# Patient Record
Sex: Female | Born: 2000 | Race: White | Hispanic: No | Marital: Single | Smoking: Current every day smoker
Health system: Southern US, Community
[De-identification: ages and names within clinical notes are randomized; demographics above are authoritative.]

## PROBLEM LIST (undated history)

## (undated) ENCOUNTER — Ambulatory Visit: Payer: BC Managed Care – PPO | Source: Home / Self Care

## (undated) DIAGNOSIS — F909 Attention-deficit hyperactivity disorder, unspecified type: Secondary | ICD-10-CM

## (undated) DIAGNOSIS — G35 Multiple sclerosis: Secondary | ICD-10-CM

## (undated) HISTORY — PX: WISDOM TOOTH EXTRACTION: SHX21

## (undated) HISTORY — DX: Multiple sclerosis: G35

---
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
## 2019-03-06 DIAGNOSIS — R202 Paresthesia of skin: Secondary | ICD-10-CM | POA: Diagnosis not present

## 2019-03-06 DIAGNOSIS — F900 Attention-deficit hyperactivity disorder, predominantly inattentive type: Secondary | ICD-10-CM | POA: Diagnosis not present

## 2019-03-06 DIAGNOSIS — T148XXA Other injury of unspecified body region, initial encounter: Secondary | ICD-10-CM | POA: Diagnosis not present

## 2019-03-06 DIAGNOSIS — M25551 Pain in right hip: Secondary | ICD-10-CM | POA: Diagnosis not present

## 2019-03-06 DIAGNOSIS — F419 Anxiety disorder, unspecified: Secondary | ICD-10-CM | POA: Diagnosis not present

## 2019-03-06 DIAGNOSIS — W19XXXA Unspecified fall, initial encounter: Secondary | ICD-10-CM | POA: Diagnosis not present

## 2019-03-28 DIAGNOSIS — S8992XA Unspecified injury of left lower leg, initial encounter: Secondary | ICD-10-CM | POA: Diagnosis not present

## 2019-03-29 DIAGNOSIS — M25562 Pain in left knee: Secondary | ICD-10-CM | POA: Diagnosis not present

## 2019-03-29 DIAGNOSIS — M5137 Other intervertebral disc degeneration, lumbosacral region: Secondary | ICD-10-CM | POA: Diagnosis not present

## 2019-03-29 DIAGNOSIS — M9903 Segmental and somatic dysfunction of lumbar region: Secondary | ICD-10-CM | POA: Diagnosis not present

## 2019-03-29 DIAGNOSIS — M545 Low back pain: Secondary | ICD-10-CM | POA: Diagnosis not present

## 2019-04-04 DIAGNOSIS — M5137 Other intervertebral disc degeneration, lumbosacral region: Secondary | ICD-10-CM | POA: Diagnosis not present

## 2019-04-04 DIAGNOSIS — M545 Low back pain: Secondary | ICD-10-CM | POA: Diagnosis not present

## 2019-04-04 DIAGNOSIS — M9903 Segmental and somatic dysfunction of lumbar region: Secondary | ICD-10-CM | POA: Diagnosis not present

## 2019-04-06 DIAGNOSIS — M9903 Segmental and somatic dysfunction of lumbar region: Secondary | ICD-10-CM | POA: Diagnosis not present

## 2019-04-06 DIAGNOSIS — M545 Low back pain: Secondary | ICD-10-CM | POA: Diagnosis not present

## 2019-04-06 DIAGNOSIS — M5137 Other intervertebral disc degeneration, lumbosacral region: Secondary | ICD-10-CM | POA: Diagnosis not present

## 2019-04-07 DIAGNOSIS — R202 Paresthesia of skin: Secondary | ICD-10-CM | POA: Diagnosis not present

## 2019-04-07 DIAGNOSIS — H539 Unspecified visual disturbance: Secondary | ICD-10-CM | POA: Diagnosis not present

## 2019-04-07 DIAGNOSIS — R479 Unspecified speech disturbances: Secondary | ICD-10-CM | POA: Diagnosis not present

## 2019-04-07 DIAGNOSIS — R29818 Other symptoms and signs involving the nervous system: Secondary | ICD-10-CM | POA: Diagnosis not present

## 2019-04-09 ENCOUNTER — Emergency Department (HOSPITAL_COMMUNITY): Payer: BC Managed Care – PPO

## 2019-04-09 ENCOUNTER — Other Ambulatory Visit: Payer: Self-pay

## 2019-04-09 ENCOUNTER — Encounter (HOSPITAL_COMMUNITY): Payer: Self-pay | Admitting: Emergency Medicine

## 2019-04-09 ENCOUNTER — Inpatient Hospital Stay (HOSPITAL_COMMUNITY)
Admission: EM | Admit: 2019-04-09 | Discharge: 2019-04-17 | DRG: 059 | Disposition: A | Discharge status: Rehabilitation Facility | Payer: BC Managed Care – PPO | Attending: Family Medicine | Admitting: Family Medicine

## 2019-04-09 DIAGNOSIS — Z79899 Other long term (current) drug therapy: Secondary | ICD-10-CM | POA: Diagnosis not present

## 2019-04-09 DIAGNOSIS — E669 Obesity, unspecified: Secondary | ICD-10-CM | POA: Diagnosis present

## 2019-04-09 DIAGNOSIS — N319 Neuromuscular dysfunction of bladder, unspecified: Secondary | ICD-10-CM | POA: Diagnosis not present

## 2019-04-09 DIAGNOSIS — R4781 Slurred speech: Secondary | ICD-10-CM | POA: Diagnosis not present

## 2019-04-09 DIAGNOSIS — G379 Demyelinating disease of central nervous system, unspecified: Secondary | ICD-10-CM

## 2019-04-09 DIAGNOSIS — Z6832 Body mass index (BMI) 32.0-32.9, adult: Secondary | ICD-10-CM

## 2019-04-09 DIAGNOSIS — F17210 Nicotine dependence, cigarettes, uncomplicated: Secondary | ICD-10-CM | POA: Diagnosis present

## 2019-04-09 DIAGNOSIS — R200 Anesthesia of skin: Secondary | ICD-10-CM | POA: Diagnosis present

## 2019-04-09 DIAGNOSIS — N3941 Urge incontinence: Secondary | ICD-10-CM | POA: Diagnosis not present

## 2019-04-09 DIAGNOSIS — R29898 Other symptoms and signs involving the musculoskeletal system: Secondary | ICD-10-CM | POA: Diagnosis not present

## 2019-04-09 DIAGNOSIS — K592 Neurogenic bowel, not elsewhere classified: Secondary | ICD-10-CM | POA: Diagnosis not present

## 2019-04-09 DIAGNOSIS — R29818 Other symptoms and signs involving the nervous system: Secondary | ICD-10-CM | POA: Diagnosis not present

## 2019-04-09 DIAGNOSIS — Z720 Tobacco use: Secondary | ICD-10-CM | POA: Diagnosis present

## 2019-04-09 DIAGNOSIS — Z6831 Body mass index (BMI) 31.0-31.9, adult: Secondary | ICD-10-CM | POA: Diagnosis not present

## 2019-04-09 DIAGNOSIS — Z8349 Family history of other endocrine, nutritional and metabolic diseases: Secondary | ICD-10-CM | POA: Diagnosis not present

## 2019-04-09 DIAGNOSIS — H532 Diplopia: Secondary | ICD-10-CM | POA: Diagnosis not present

## 2019-04-09 DIAGNOSIS — G35 Multiple sclerosis: Secondary | ICD-10-CM | POA: Diagnosis present

## 2019-04-09 DIAGNOSIS — Z881 Allergy status to other antibiotic agents status: Secondary | ICD-10-CM | POA: Diagnosis not present

## 2019-04-09 DIAGNOSIS — F17200 Nicotine dependence, unspecified, uncomplicated: Secondary | ICD-10-CM | POA: Diagnosis not present

## 2019-04-09 DIAGNOSIS — Z23 Encounter for immunization: Secondary | ICD-10-CM

## 2019-04-09 DIAGNOSIS — Z885 Allergy status to narcotic agent status: Secondary | ICD-10-CM

## 2019-04-09 DIAGNOSIS — E875 Hyperkalemia: Secondary | ICD-10-CM | POA: Diagnosis not present

## 2019-04-09 DIAGNOSIS — W19XXXA Unspecified fall, initial encounter: Secondary | ICD-10-CM | POA: Diagnosis present

## 2019-04-09 DIAGNOSIS — Z713 Dietary counseling and surveillance: Secondary | ICD-10-CM | POA: Diagnosis not present

## 2019-04-09 DIAGNOSIS — Z888 Allergy status to other drugs, medicaments and biological substances status: Secondary | ICD-10-CM

## 2019-04-09 DIAGNOSIS — R531 Weakness: Secondary | ICD-10-CM | POA: Diagnosis not present

## 2019-04-09 DIAGNOSIS — Z8249 Family history of ischemic heart disease and other diseases of the circulatory system: Secondary | ICD-10-CM

## 2019-04-09 DIAGNOSIS — F988 Other specified behavioral and emotional disorders with onset usually occurring in childhood and adolescence: Secondary | ICD-10-CM | POA: Diagnosis present

## 2019-04-09 DIAGNOSIS — G360 Neuromyelitis optica [Devic]: Secondary | ICD-10-CM | POA: Diagnosis present

## 2019-04-09 DIAGNOSIS — Z716 Tobacco abuse counseling: Secondary | ICD-10-CM | POA: Diagnosis not present

## 2019-04-09 DIAGNOSIS — R270 Ataxia, unspecified: Secondary | ICD-10-CM | POA: Diagnosis present

## 2019-04-09 DIAGNOSIS — F909 Attention-deficit hyperactivity disorder, unspecified type: Secondary | ICD-10-CM

## 2019-04-09 DIAGNOSIS — Z833 Family history of diabetes mellitus: Secondary | ICD-10-CM | POA: Diagnosis not present

## 2019-04-09 DIAGNOSIS — S8012XD Contusion of left lower leg, subsequent encounter: Secondary | ICD-10-CM | POA: Diagnosis not present

## 2019-04-09 DIAGNOSIS — S299XXA Unspecified injury of thorax, initial encounter: Secondary | ICD-10-CM | POA: Diagnosis not present

## 2019-04-09 DIAGNOSIS — S8012XA Contusion of left lower leg, initial encounter: Secondary | ICD-10-CM | POA: Diagnosis not present

## 2019-04-09 DIAGNOSIS — G8190 Hemiplegia, unspecified affecting unspecified side: Secondary | ICD-10-CM | POA: Diagnosis not present

## 2019-04-09 DIAGNOSIS — E876 Hypokalemia: Secondary | ICD-10-CM | POA: Diagnosis not present

## 2019-04-09 DIAGNOSIS — K5900 Constipation, unspecified: Secondary | ICD-10-CM | POA: Diagnosis not present

## 2019-04-09 DIAGNOSIS — R000 Tachycardia, unspecified: Secondary | ICD-10-CM | POA: Diagnosis not present

## 2019-04-09 DIAGNOSIS — E6609 Other obesity due to excess calories: Secondary | ICD-10-CM | POA: Diagnosis not present

## 2019-04-09 DIAGNOSIS — Z20822 Contact with and (suspected) exposure to covid-19: Secondary | ICD-10-CM | POA: Diagnosis not present

## 2019-04-09 HISTORY — DX: Attention-deficit hyperactivity disorder, unspecified type: F90.9

## 2019-04-09 LAB — URINALYSIS, ROUTINE W REFLEX MICROSCOPIC
Bilirubin Urine: NEGATIVE
Glucose, UA: NEGATIVE mg/dL
Hgb urine dipstick: NEGATIVE
Ketones, ur: 5 mg/dL — AB
Nitrite: NEGATIVE
Protein, ur: NEGATIVE mg/dL
Specific Gravity, Urine: 1.013 (ref 1.005–1.030)
pH: 7 (ref 5.0–8.0)

## 2019-04-09 LAB — CBC WITH DIFFERENTIAL/PLATELET
Abs Immature Granulocytes: 0.01 10*3/uL (ref 0.00–0.07)
Basophils Absolute: 0 10*3/uL (ref 0.0–0.1)
Basophils Relative: 0 %
Eosinophils Absolute: 0.2 10*3/uL (ref 0.0–0.5)
Eosinophils Relative: 4 %
HCT: 44 % (ref 36.0–46.0)
Hemoglobin: 15 g/dL (ref 12.0–15.0)
Immature Granulocytes: 0 %
Lymphocytes Relative: 37 %
Lymphs Abs: 1.8 10*3/uL (ref 0.7–4.0)
MCH: 28.9 pg (ref 26.0–34.0)
MCHC: 34.1 g/dL (ref 30.0–36.0)
MCV: 84.8 fL (ref 80.0–100.0)
Monocytes Absolute: 0.5 10*3/uL (ref 0.1–1.0)
Monocytes Relative: 10 %
Neutro Abs: 2.4 10*3/uL (ref 1.7–7.7)
Neutrophils Relative %: 49 %
Platelets: 210 10*3/uL (ref 150–400)
RBC: 5.19 MIL/uL — ABNORMAL HIGH (ref 3.87–5.11)
RDW: 11.9 % (ref 11.5–15.5)
WBC: 4.9 10*3/uL (ref 4.0–10.5)
nRBC: 0 % (ref 0.0–0.2)

## 2019-04-09 LAB — COMPREHENSIVE METABOLIC PANEL
ALT: 19 U/L (ref 0–44)
AST: 31 U/L (ref 15–41)
Albumin: 4 g/dL (ref 3.5–5.0)
Alkaline Phosphatase: 60 U/L (ref 38–126)
Anion gap: 11 (ref 5–15)
BUN: 9 mg/dL (ref 6–20)
CO2: 22 mmol/L (ref 22–32)
Calcium: 9.1 mg/dL (ref 8.9–10.3)
Chloride: 105 mmol/L (ref 98–111)
Creatinine, Ser: 0.77 mg/dL (ref 0.44–1.00)
GFR calc Af Amer: >60 mL/min (ref >60)
GFR calc non Af Amer: >60 mL/min (ref >60)
Glucose, Bld: 81 mg/dL (ref 70–99)
Potassium: 4.7 mmol/L (ref 3.5–5.1)
Sodium: 138 mmol/L (ref 135–145)
Total Bilirubin: 1.1 mg/dL (ref 0.3–1.2)
Total Protein: 6.6 g/dL (ref 6.5–8.1)

## 2019-04-09 LAB — PREGNANCY, URINE: Preg Test, Ur: NEGATIVE

## 2019-04-09 LAB — TSH: TSH: 2.107 u[IU]/mL (ref 0.350–4.500)

## 2019-04-09 MED ORDER — SODIUM CHLORIDE 0.9 % IV SOLN
1000.0000 mg | Freq: Once | INTRAVENOUS | Status: AC
  Administered 2019-04-10: 1000 mg via INTRAVENOUS
  Filled 2019-04-09: qty 8

## 2019-04-09 MED ORDER — GADOBUTROL 1 MMOL/ML IV SOLN
7.5000 mL | Freq: Once | INTRAVENOUS | Status: AC | PRN
  Administered 2019-04-09: 23:00:00 7.5 mL via INTRAVENOUS

## 2019-04-09 NOTE — ED Provider Notes (Signed)
Assumed care from Dr. Adriana Simas at 9 PM.  Patient with nonlocalizing neurologic symptoms with diplopia when looking to the right as well as numbness from the elbow down on the left upper arm and the knee down on the left lower leg.  Patient's labs are reassuring.  Pregnancy is negative.  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  There is no evidence of hemorrhage or spinal canal stenosis.  Will discuss with neurology for further recommendations.  11:36 PM Will admit.  Pt will get steroids.   Gwyneth Sprout, MD 04/09/19 [phone]

## 2019-04-09 NOTE — ED Notes (Signed)
Patient transported to CT 

## 2019-04-09 NOTE — ED Provider Notes (Signed)
[facility] DEPARTMENT Provider Note   CSN: [id number] Arrival date & time: 04/09/19  1442     History Chief Complaint  Patient presents with  . Weakness  . Fall    Jean Carlson is a 19 y.o. female.  Chief complaint weakness and numbness left upper extremity from elbow distally, left lower extremity from knee distally, slurred speech, double vision when looking to the right.  Symptoms have been evolving for several days.  No previous history of same.  No stiff neck, facial drooping, fever, sweats, chills.        Past Medical History:  Diagnosis Date  . ADHD     There are no problems to display for this patient.   History reviewed. No pertinent surgical history.   OB History   No obstetric history on file.     Family History  Problem Relation Age of Onset  . Hyperlipidemia Maternal Grandmother   . Hypertension Maternal Grandmother   . Diabetes Paternal Grandfather   . Hypertension Paternal Grandfather   . Heart failure Paternal Grandfather     Social History   Tobacco Use  . Smoking status: Current Every Day Smoker  . Smokeless tobacco: Never Used  Substance Use Topics  . Alcohol use: Not Currently  . Drug use: Never    Home Medications Prior to Admission medications   Medication Sig Start Date End Date Taking? Authorizing Provider  FLUoxetine (PROZAC) 40 MG capsule Take 40 mg by mouth in the morning. 01/19/19  Yes [provider]  ibuprofen (ADVIL) 200 MG tablet Take 600-800 mg by mouth every 6 (six) hours as needed for headache or mild pain.   Yes [provider]  LO LOESTRIN FE 1 MG-10 MCG / 10 MCG tablet Take 1 tablet by mouth daily.  01/19/19  Yes [provider]  methylphenidate 36 MG PO CR tablet Take 36 mg by mouth in the morning.   Yes [provider]  meloxicam (MOBIC) 7.5 MG tablet Take 1 tablet (7.5 mg total) by mouth daily. Patient not taking: Reported on 04/09/2019 10/28/12    Posey Boyer, MD    Allergies    Amoxicillin, Codeine, and Vyvanse [lisdexamfetamine]  Review of Systems   Review of Systems  All other systems reviewed and are negative.   Physical Exam Updated Vital Signs BP 123/81 (BP Location: Right Arm)   Pulse 90   Temp 98.6 F (37 C) (Oral)   Resp 16   Ht 5\' 4"  (1.626 m)   Wt 84.8 kg   LMP 03/17/2019   SpO2 100%   BMI 32.10 kg/m   Physical Exam Vitals and nursing note reviewed.  Constitutional:      Appearance: She is well-developed.  HENT:     Head: Normocephalic and atraumatic.  Eyes:     Conjunctiva/sclera: Conjunctivae normal.  Cardiovascular:     Rate and Rhythm: Normal rate and regular rhythm.  Pulmonary:     Effort: Pulmonary effort is normal.     Breath sounds: Normal breath sounds.  Abdominal:     General: Bowel sounds are normal.     Palpations: Abdomen is soft.  Musculoskeletal:        General: Normal range of motion.     Cervical back: Neck supple.  Skin:    General: Skin is warm and dry.  Neurological:     Mental Status: She is alert and oriented to person, place, and time.     Comments: Moves  left arm and left leg appropriately.  Questionable decreased grip on the left.  Psychiatric:        Behavior: Behavior normal.     ED Results / Procedures / Treatments   Labs (all labs ordered are listed, but only abnormal results are displayed) Labs Reviewed  CBC WITH DIFFERENTIAL/PLATELET - Abnormal; Notable for the following components:      Result Value   RBC 5.19 (*)    All other components within normal limits  COMPREHENSIVE METABOLIC PANEL  TSH  URINALYSIS, ROUTINE W REFLEX MICROSCOPIC  PREGNANCY, URINE    EKG EKG Interpretation  Date/Time:  Sunday April 09 2019 16:51:21 EDT Ventricular Rate:  94 PR Interval:    QRS Duration: 89 QT Interval:  345 QTC Calculation: 432 R Axis:   76 Text Interpretation: Sinus rhythm Confirmed by Donnetta Hutching ([id number]) on 04/09/2019 4:56:29 PM Also confirmed by  Donnetta Hutching ([id number])  on 04/09/2019 4:57:42 PM   Radiology CT Head Wo Contrast  Result Date: 04/09/2019 CLINICAL DATA:  Left-sided neuro deficits EXAM: CT HEAD WITHOUT CONTRAST TECHNIQUE: Contiguous axial images were obtained from the base of the skull through the vertex without intravenous contrast. COMPARISON:  None. FINDINGS: Brain: No evidence of acute infarction, hemorrhage, hydrocephalus, extra-axial collection or mass lesion/mass effect. Vascular: No hyperdense vessel or unexpected calcification. Skull: Normal. Negative for fracture or focal lesion. Sinuses/Orbits: No acute finding. Other: None. IMPRESSION: No acute intracranial pathology. Electronically Signed   By: Lauralyn Primes M.D.   On: 04/09/2019 17:29    Procedures Procedures (including critical care time)  Medications Ordered in ED Medications - No data to display  ED Course  I have reviewed the triage vital signs and the nursing notes.  Pertinent labs & imaging results that were available during my care of the patient were reviewed by me and considered in my medical decision making (see chart for details).    MDM Rules/Calculators/A&P                      Uncertain etiology of patient's symptom complex.  CT head shows no acute pathology.  Labs reasonable.  Will obtain MRI of brain.  2045:  Disc c Dr Anitra Lauth Final Clinical Impression(s) / ED Diagnoses Final diagnoses:  Weakness of left upper extremity  Weakness of left lower extremity  Ataxia  Diplopia    Rx / DC Orders ED Discharge Orders    None       Donnetta Hutching, MD 04/09/19 2025

## 2019-04-09 NOTE — ED Triage Notes (Addendum)
Pt to triage via [facility].  Slipped and fell in bathtub 2 weeks ago and injured L knee.  For the last week she reports increased weakness to L lower leg and L arm from elbow to hand with unsteady gait, decreased L grip strength, and L arm drift.  Family reports intermittent slurred speech x 2 days. Pt also reports diplopia only when she turns her head to the right.  Denies LOC.  Denies head or neck pain from fall.

## 2019-04-09 NOTE — ED Notes (Signed)
Father, Gerlene Burdock, to be called with updates at [phone].

## 2019-04-10 ENCOUNTER — Emergency Department (HOSPITAL_COMMUNITY): Payer: BC Managed Care – PPO

## 2019-04-10 DIAGNOSIS — W19XXXA Unspecified fall, initial encounter: Secondary | ICD-10-CM | POA: Diagnosis present

## 2019-04-10 DIAGNOSIS — Z720 Tobacco use: Secondary | ICD-10-CM | POA: Diagnosis present

## 2019-04-10 DIAGNOSIS — Z8349 Family history of other endocrine, nutritional and metabolic diseases: Secondary | ICD-10-CM | POA: Diagnosis not present

## 2019-04-10 DIAGNOSIS — K592 Neurogenic bowel, not elsewhere classified: Secondary | ICD-10-CM | POA: Diagnosis not present

## 2019-04-10 DIAGNOSIS — Z833 Family history of diabetes mellitus: Secondary | ICD-10-CM | POA: Diagnosis not present

## 2019-04-10 DIAGNOSIS — F988 Other specified behavioral and emotional disorders with onset usually occurring in childhood and adolescence: Secondary | ICD-10-CM | POA: Diagnosis present

## 2019-04-10 DIAGNOSIS — N319 Neuromuscular dysfunction of bladder, unspecified: Secondary | ICD-10-CM | POA: Diagnosis not present

## 2019-04-10 DIAGNOSIS — R29898 Other symptoms and signs involving the musculoskeletal system: Secondary | ICD-10-CM | POA: Diagnosis not present

## 2019-04-10 DIAGNOSIS — H532 Diplopia: Secondary | ICD-10-CM | POA: Diagnosis not present

## 2019-04-10 DIAGNOSIS — E6609 Other obesity due to excess calories: Secondary | ICD-10-CM | POA: Diagnosis not present

## 2019-04-10 DIAGNOSIS — Z881 Allergy status to other antibiotic agents status: Secondary | ICD-10-CM | POA: Diagnosis not present

## 2019-04-10 DIAGNOSIS — Z20822 Contact with and (suspected) exposure to covid-19: Secondary | ICD-10-CM | POA: Diagnosis present

## 2019-04-10 DIAGNOSIS — Z8249 Family history of ischemic heart disease and other diseases of the circulatory system: Secondary | ICD-10-CM | POA: Diagnosis not present

## 2019-04-10 DIAGNOSIS — Z6831 Body mass index (BMI) 31.0-31.9, adult: Secondary | ICD-10-CM | POA: Diagnosis not present

## 2019-04-10 DIAGNOSIS — Z79899 Other long term (current) drug therapy: Secondary | ICD-10-CM | POA: Diagnosis not present

## 2019-04-10 DIAGNOSIS — G35 Multiple sclerosis: Secondary | ICD-10-CM | POA: Diagnosis present

## 2019-04-10 DIAGNOSIS — Z888 Allergy status to other drugs, medicaments and biological substances status: Secondary | ICD-10-CM | POA: Diagnosis not present

## 2019-04-10 DIAGNOSIS — Z6832 Body mass index (BMI) 32.0-32.9, adult: Secondary | ICD-10-CM | POA: Diagnosis not present

## 2019-04-10 DIAGNOSIS — Z885 Allergy status to narcotic agent status: Secondary | ICD-10-CM | POA: Diagnosis not present

## 2019-04-10 DIAGNOSIS — F17210 Nicotine dependence, cigarettes, uncomplicated: Secondary | ICD-10-CM | POA: Diagnosis present

## 2019-04-10 DIAGNOSIS — Z716 Tobacco abuse counseling: Secondary | ICD-10-CM | POA: Diagnosis not present

## 2019-04-10 DIAGNOSIS — E876 Hypokalemia: Secondary | ICD-10-CM | POA: Diagnosis not present

## 2019-04-10 DIAGNOSIS — R270 Ataxia, unspecified: Secondary | ICD-10-CM | POA: Diagnosis present

## 2019-04-10 DIAGNOSIS — R4781 Slurred speech: Secondary | ICD-10-CM | POA: Diagnosis present

## 2019-04-10 DIAGNOSIS — G379 Demyelinating disease of central nervous system, unspecified: Secondary | ICD-10-CM | POA: Diagnosis not present

## 2019-04-10 DIAGNOSIS — R200 Anesthesia of skin: Secondary | ICD-10-CM | POA: Diagnosis present

## 2019-04-10 DIAGNOSIS — F909 Attention-deficit hyperactivity disorder, unspecified type: Secondary | ICD-10-CM | POA: Diagnosis present

## 2019-04-10 DIAGNOSIS — G360 Neuromyelitis optica [Devic]: Secondary | ICD-10-CM | POA: Diagnosis present

## 2019-04-10 DIAGNOSIS — Z23 Encounter for immunization: Secondary | ICD-10-CM | POA: Diagnosis not present

## 2019-04-10 DIAGNOSIS — E669 Obesity, unspecified: Secondary | ICD-10-CM | POA: Diagnosis present

## 2019-04-10 DIAGNOSIS — S8012XA Contusion of left lower leg, initial encounter: Secondary | ICD-10-CM | POA: Diagnosis not present

## 2019-04-10 DIAGNOSIS — Z713 Dietary counseling and surveillance: Secondary | ICD-10-CM | POA: Diagnosis not present

## 2019-04-10 LAB — COMPREHENSIVE METABOLIC PANEL
ALT: 19 U/L (ref 0–44)
AST: 26 U/L (ref 15–41)
Albumin: 3.8 g/dL (ref 3.5–5.0)
Alkaline Phosphatase: 54 U/L (ref 38–126)
Anion gap: 10 (ref 5–15)
BUN: 7 mg/dL (ref 6–20)
CO2: 22 mmol/L (ref 22–32)
Calcium: 9.1 mg/dL (ref 8.9–10.3)
Chloride: 106 mmol/L (ref 98–111)
Creatinine, Ser: 0.9 mg/dL (ref 0.44–1.00)
GFR calc Af Amer: >60 mL/min (ref >60)
GFR calc non Af Amer: >60 mL/min (ref >60)
Glucose, Bld: 159 mg/dL — ABNORMAL HIGH (ref 70–99)
Potassium: 3.4 mmol/L — ABNORMAL LOW (ref 3.5–5.1)
Sodium: 138 mmol/L (ref 135–145)
Total Bilirubin: 1.1 mg/dL (ref 0.3–1.2)
Total Protein: 6.3 g/dL — ABNORMAL LOW (ref 6.5–8.1)

## 2019-04-10 LAB — CBC
HCT: 40.1 % (ref 36.0–46.0)
Hemoglobin: 14 g/dL (ref 12.0–15.0)
MCH: 29.4 pg (ref 26.0–34.0)
MCHC: 34.9 g/dL (ref 30.0–36.0)
MCV: 84.1 fL (ref 80.0–100.0)
Platelets: 225 10*3/uL (ref 150–400)
RBC: 4.77 MIL/uL (ref 3.87–5.11)
RDW: 11.8 % (ref 11.5–15.5)
WBC: 4.2 10*3/uL (ref 4.0–10.5)
nRBC: 0 % (ref 0.0–0.2)

## 2019-04-10 LAB — SARS CORONAVIRUS 2 (TAT 6-24 HRS): SARS Coronavirus 2: NEGATIVE

## 2019-04-10 LAB — HIV ANTIBODY (ROUTINE TESTING W REFLEX): HIV Screen 4th Generation wRfx: NONREACTIVE

## 2019-04-10 MED ORDER — SODIUM CHLORIDE 0.9 % IV SOLN
1000.0000 mg | Freq: Every day | INTRAVENOUS | Status: AC
  Administered 2019-04-10 – 2019-04-13 (×4): 1000 mg via INTRAVENOUS
  Filled 2019-04-10 (×7): qty 8

## 2019-04-10 MED ORDER — METHYLPHENIDATE HCL ER (OSM) 18 MG PO TBCR
36.0000 mg | EXTENDED_RELEASE_TABLET | Freq: Every morning | ORAL | Status: DC
  Administered 2019-04-11 – 2019-04-17 (×7): 36 mg via ORAL
  Filled 2019-04-10 (×7): qty 2

## 2019-04-10 MED ORDER — METHYLPHENIDATE HCL ER (OSM) 36 MG PO TBCR
36.0000 mg | EXTENDED_RELEASE_TABLET | Freq: Once | ORAL | Status: AC
  Administered 2019-04-10: 36 mg via ORAL
  Filled 2019-04-10: qty 1

## 2019-04-10 MED ORDER — IBUPROFEN 200 MG PO TABS: 600.0000 mg | ORAL_TABLET | Freq: Four times a day (QID) | ORAL | Status: DC | PRN

## 2019-04-10 MED ORDER — ACETAMINOPHEN 325 MG PO TABS
650.0000 mg | ORAL_TABLET | Freq: Four times a day (QID) | ORAL | Status: DC | PRN
  Administered 2019-04-17: 650 mg via ORAL
  Filled 2019-04-10: qty 2

## 2019-04-10 MED ORDER — ACETAMINOPHEN 650 MG RE SUPP: 650.0000 mg | Freq: Four times a day (QID) | RECTAL | Status: DC | PRN

## 2019-04-10 MED ORDER — SODIUM CHLORIDE 0.45 % IV SOLN: INTRAVENOUS | Status: DC

## 2019-04-10 MED ORDER — FLUOXETINE HCL 20 MG PO CAPS
40.0000 mg | ORAL_CAPSULE | Freq: Every day | ORAL | Status: DC
  Administered 2019-04-10 – 2019-04-17 (×8): 40 mg via ORAL
  Filled 2019-04-10 (×9): qty 2

## 2019-04-10 MED ORDER — NORETHIN-ETH ESTRAD-FE BIPHAS 1 MG-10 MCG / 10 MCG PO TABS: 1.0000 | ORAL_TABLET | Freq: Every day | ORAL | Status: DC

## 2019-04-10 NOTE — Progress Notes (Signed)
Patient seen and examined and agree with plan of care according to my partner Dr. Mikeal Hawthorne  19 year old female prior ADHD, came to emergency room 3/14 with left upper extremity numbness left lower extremity weakness slurred speech and double vision MRI brain showed scattered multiple white matter lesions Neurology saw the patient-as infectious work-up UA CXR negative patient started on Solu-Medrol which will be needed for 5 days   Awake pleasant coherent slightly sleepy and says that she sees double Sensory is much more present on the right lower extremity and L3-4 S1 distribution than on the left Power is also affected more on the left side and upper and lower extremities Chest is clear She has multiple tattoos   We will see if case manager can set her up with outpatient appointment with neurologist

## 2019-04-10 NOTE — H&P (Signed)
History and Physical   Jean Carlson YWV:[id number] DOB: 07-21-2000 DOA: 04/09/2019  Referring MD/NP/PA: Dr. Maryan Rued  PCP: Patient, No Pcp Per   Outpatient Specialists: None  Patient coming from: Home  Chief Complaint: Visual changes  HPI: Jean Carlson is a 19 y.o. female with medical history significant of ADD presenting to the ER with left lower extremity and left upper extremity weakness and numbness slurred speech double vision especially when looking to the right.  Symptoms started a few days ago.  They have gotten worse now.  Patient is having double vision and is worried.  She is seen and evaluated and had MRI done.  MRI showed multiple lesions consistent with multiple sclerosis.  Her symptoms is mainly in the left lower leg now below the knee as well on the left upper extremity.  The visual changes continue to get worse.  The MRI showed multiple lesions in the brain and cervical spine.  Differentials were multiple sclerosis or neuromyelitis optica spectrum disorder.  There was disconjugate gaze.  Neurology has seen the patient and recommended initiating IV steroids and admission to the hospital for treatment..  ED Course: Temperature 96 blood pressure 120/68 pulse 103 respiratory 16 oxygen sat 99% on room air.  White count 4.9 hemoglobin 15 the rest of the electrolytes are within normal.  Urinalysis essentially negative.  Chest x-ray showed no acute findings.  COVID-19 is currently pending.  Cervical spine showed the multiple white matter lesions scattered throughout the brain as well as cervical spinal cord.  No hemorrhage or hydrocephalus.  Neurology consulted and patient initiated on treatment.  Review of Systems: As per HPI otherwise 10 point review of systems negative.    Past Medical History:  Diagnosis Date  . ADHD     History reviewed. No pertinent surgical history.   reports that she has been smoking. She has never used smokeless tobacco. She reports previous alcohol  use. She reports that she does not use drugs.  Allergies  Allergen Reactions  . Amoxicillin Other (See Comments)    CAUSED BAD NIGHTMARES  . Codeine Nausea Only    SEVERE NAUSEA  . Vyvanse [Lisdexamfetamine] Other (See Comments)    "Caused depression to the point of self-harm"    Family History  Problem Relation Age of Onset  . Hyperlipidemia Maternal Grandmother   . Hypertension Maternal Grandmother   . Diabetes Paternal Grandfather   . Hypertension Paternal Grandfather   . Heart failure Paternal Grandfather      Prior to Admission medications   Medication Sig Start Date End Date Taking? Authorizing Provider  FLUoxetine (PROZAC) 40 MG capsule Take 40 mg by mouth in the morning. 01/19/19  Yes [provider]  ibuprofen (ADVIL) 200 MG tablet Take 600-800 mg by mouth every 6 (six) hours as needed for headache or mild pain.   Yes [provider]  LO LOESTRIN FE 1 MG-10 MCG / 10 MCG tablet Take 1 tablet by mouth daily.  01/19/19  Yes [provider]  methylphenidate 36 MG PO CR tablet Take 36 mg by mouth in the morning.   Yes [provider]  meloxicam (MOBIC) 7.5 MG tablet Take 1 tablet (7.5 mg total) by mouth daily. Patient not taking: Reported on 04/09/2019 10/28/12   Posey Boyer, MD    Physical Exam: Vitals:   04/09/19 1509 04/09/19 2100 04/10/19 0030  BP: 123/81 120/68 124/72  Pulse: 90 89 (!) 103  Resp: 16 12 16   Temp: 98.6 F (37  C)    TempSrc: Oral    SpO2: 100% 100% 99%  Weight: 84.8 kg    Height: 5\' 4"  (1.626 m)        Constitutional: Obese, anxious, no significant distress Vitals:   04/09/19 1509 04/09/19 2100 04/10/19 0030  BP: 123/81 120/68 124/72  Pulse: 90 89 (!) 103  Resp: 16 12 16   Temp: 98.6 F (37 C)    TempSrc: Oral    SpO2: 100% 100% 99%  Weight: 84.8 kg    Height: 5\' 4"  (1.626 m)     Eyes: PERRL, lids and conjunctivae normal ENMT: Mucous membranes are moist. Posterior pharynx clear of any exudate or  lesions.Normal dentition.  Neck: normal, supple, no masses, no thyromegaly Respiratory: clear to auscultation bilaterally, no wheezing, no crackles. Normal respiratory effort. No accessory muscle use.  Cardiovascular: Regular rate and rhythm, no murmurs / rubs / gallops. No extremity edema. 2+ pedal pulses. No carotid bruits.  Abdomen: no tenderness, no masses palpated. No hepatosplenomegaly. Bowel sounds positive.  Musculoskeletal: no clubbing / cyanosis. No joint deformity upper and lower extremities. Good ROM, no contractures. Normal muscle tone.  Skin: no rashes, lesions, ulcers. No induration Neurologic: Visible nystagmus with diplopia, strength 5 out of 5 bilaterally, diplopia 1 looking to the right.  Decreased sensations on the left leg and upper extremity.  Hyperreflexia Psychiatric: Normal judgment and insight. Alert and oriented x 3. Normal mood.     Labs on Admission: I have personally reviewed following labs and imaging studies  CBC: Recent Labs  Lab 04/09/19 1702  WBC 4.9  NEUTROABS 2.4  HGB 15.0  HCT 44.0  MCV 84.8  PLT 210   Basic Metabolic Panel: Recent Labs  Lab 04/09/19 1702  NA 138  K 4.7  CL 105  CO2 22  GLUCOSE 81  BUN 9  CREATININE 0.77  CALCIUM 9.1   GFR: Estimated Creatinine Clearance: 118.1 mL/min (by C-G formula based on SCr of 0.77 mg/dL). Liver Function Tests: Recent Labs  Lab 04/09/19 1702  AST 31  ALT 19  ALKPHOS 60  BILITOT 1.1  PROT 6.6  ALBUMIN 4.0   No results for input(s): LIPASE, AMYLASE in the last 168 hours. No results for input(s): AMMONIA in the last 168 hours. Coagulation Profile: No results for input(s): INR, PROTIME in the last 168 hours. Cardiac Enzymes: No results for input(s): CKTOTAL, CKMB, CKMBINDEX, TROPONINI in the last 168 hours. BNP (last 3 results) No results for input(s): PROBNP in the last 8760 hours. HbA1C: No results for input(s): HGBA1C in the last 72 hours. CBG: No results for input(s): GLUCAP in  the last 168 hours. Lipid Profile: No results for input(s): CHOL, HDL, LDLCALC, TRIG, CHOLHDL, LDLDIRECT in the last 72 hours. Thyroid Function Tests: Recent Labs    04/09/19 1702  TSH 2.107   Anemia Panel: No results for input(s): VITAMINB12, FOLATE, FERRITIN, TIBC, IRON, RETICCTPCT in the last 72 hours. Urine analysis:    Component Value Date/Time   COLORURINE YELLOW 04/09/2019 2054   APPEARANCEUR HAZY (A) 04/09/2019 2054   LABSPEC 1.013 04/09/2019 2054   PHURINE 7.0 04/09/2019 2054   GLUCOSEU NEGATIVE 04/09/2019 2054   HGBUR NEGATIVE 04/09/2019 2054   BILIRUBINUR NEGATIVE 04/09/2019 2054   KETONESUR 5 (A) 04/09/2019 2054   PROTEINUR NEGATIVE 04/09/2019 2054   NITRITE NEGATIVE 04/09/2019 2054   LEUKOCYTESUR LARGE (A) 04/09/2019 2054   Sepsis Labs: @LABRCNTIP (procalcitonin:4,lacticidven:4) )No results found for this or any previous visit (from the past 240 hour(s)).  Radiological Exams on Admission: CT Head Wo Contrast  Result Date: 04/09/2019 CLINICAL DATA:  Left-sided neuro deficits EXAM: CT HEAD WITHOUT CONTRAST TECHNIQUE: Contiguous axial images were obtained from the base of the skull through the vertex without intravenous contrast. COMPARISON:  None. FINDINGS: Brain: No evidence of acute infarction, hemorrhage, hydrocephalus, extra-axial collection or mass lesion/mass effect. Vascular: No hyperdense vessel or unexpected calcification. Skull: Normal. Negative for fracture or focal lesion. Sinuses/Orbits: No acute finding. Other: None. IMPRESSION: No acute intracranial pathology. Electronically Signed   By: Lauralyn Primes M.D.   On: 04/09/2019 17:29   MR BRAIN W WO CONTRAST  Result Date: 04/09/2019 CLINICAL DATA:  Ataxia. Weakness and numbness of the left upper and lower extremities. Slurred speech. Double vision. Symptoms for several days. EXAM: MRI HEAD WITHOUT AND WITH CONTRAST MRI CERVICAL SPINE WITHOUT AND WITH CONTRAST TECHNIQUE: Multiplanar, multiecho pulse sequences  of the brain and surrounding structures, and cervical spine, to include the craniocervical junction and cervicothoracic junction, were obtained without and with intravenous contrast. CONTRAST:  7.77mL GADAVIST GADOBUTROL 1 MMOL/ML IV SOLN COMPARISON:  Head CT 04/09/2019 FINDINGS: MRI HEAD FINDINGS Brain: There are multiple (15-20) hyperintense T2-weighted signal lesions scattered throughout the cerebral white matter, the brainstem and the cervical spine. The largest lesions are within the right frontal periventricular white matter. The largest lesion measures 1.9 x 1.1 cm. There is no midline shift. There is mild edema surrounding the larger lesions. Multiple lesions involve the corpus callosum. There is no intracranial hemorrhage or extra-axial collection. Normal appearance of the dura. No hydrocephalus. Vascular: Normal flow voids. Skull: Normal calvarium. Sinuses/Orbits: Clear sinuses. No orbital abnormality visible. Other: None MRI CERVICAL SPINE FINDINGS Alignment: Straightening of normal cervical lordosis but otherwise normal alignment. Vertebrae: Normal marrow signal. Cord: There are multiple edematous, hyperintense T2-weighted signal lesions of the spinal cord, most notably at C2, C3 and C4-5. There is mild contrast enhancement at the C4-5 level. Posterior Fossa, vertebral arteries, paraspinal tissues: Negative. Disc levels: There is no disc herniation, spinal canal stenosis or neural foraminal stenosis. IMPRESSION: 1. 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 2. Multiple lesions within the brainstem may account for reported diplopia due to involvement of gaze coordination pathways. 3. No hemorrhage, midline shift or hydrocephalus. 4. No cervical spinal canal stenosis. Electronically Signed   By: Deatra Robinson M.D.   On: 04/09/2019 23:23   MR CERVICAL  SPINE W WO CONTRAST  Result Date: 04/09/2019 CLINICAL DATA:  Ataxia. Weakness and numbness of the left upper and lower extremities. Slurred speech. Double vision. Symptoms for several days. EXAM: MRI HEAD WITHOUT AND WITH CONTRAST MRI CERVICAL SPINE WITHOUT AND WITH CONTRAST TECHNIQUE: Multiplanar, multiecho pulse sequences of the brain and surrounding structures, and cervical spine, to include the craniocervical junction and cervicothoracic junction, were obtained without and with intravenous contrast. CONTRAST:  7.41mL GADAVIST GADOBUTROL 1 MMOL/ML IV SOLN COMPARISON:  Head CT 04/09/2019 FINDINGS: MRI HEAD FINDINGS Brain: There are multiple (15-20) hyperintense T2-weighted signal lesions scattered throughout the cerebral white matter, the brainstem and the cervical spine. The largest lesions are within the right frontal periventricular white matter. The largest lesion measures 1.9 x 1.1 cm. There is no midline shift. There is mild edema surrounding the larger lesions. Multiple lesions involve the corpus callosum. There is no intracranial hemorrhage or extra-axial collection. Normal appearance of the dura. No hydrocephalus. Vascular: Normal flow voids. Skull: Normal  calvarium. Sinuses/Orbits: Clear sinuses. No orbital abnormality visible. Other: None MRI CERVICAL SPINE FINDINGS Alignment: Straightening of normal cervical lordosis but otherwise normal alignment. Vertebrae: Normal marrow signal. Cord: There are multiple edematous, hyperintense T2-weighted signal lesions of the spinal cord, most notably at C2, C3 and C4-5. There is mild contrast enhancement at the C4-5 level. Posterior Fossa, vertebral arteries, paraspinal tissues: Negative. Disc levels: There is no disc herniation, spinal canal stenosis or neural foraminal stenosis. IMPRESSION: 1. Multiple white matter lesions scattered throughout the brain and cervical spinal cord, most consistent with demyelinating disease, likely multiple sclerosis or  neuromyelitis optica spectrum disorder. 2. Multiple lesions within the brainstem may account for reported diplopia due to involvement of gaze coordination pathways. 3. No hemorrhage, midline shift or hydrocephalus. 4. No cervical spinal canal stenosis. Electronically Signed   By: Deatra Robinson M.D.   On: 04/09/2019 23:23      Assessment/Plan Principal Problem:   MS (multiple sclerosis) (HCC) Active Problems:   ADD (attention deficit disorder)   Tobacco abuse     #1 demyelinating brain lesions: Suspected multiple sclerosis new onset.  Patient recommended to have 1000 g of Solu-Medrol daily for the next 5 days.  Neurology to follow and adjust medications accordingly.  We will admit the patient and follow recommendations from neurology.  #2 AD H D: Continue home medications.  #3 obesity: Dietary counseling  #4 tobacco use: Counseling provided.  Nicotine patch offered    DVT prophylaxis: SCD Code Status: Full code Family Communication: No family at bedside Disposition Plan: Home Consults called: Dr. Jerrell Belfast neurology Admission status: Inpatient  Severity of Illness: The appropriate patient status for this patient is INPATIENT. Inpatient status is judged to be reasonable and necessary in order to provide the required intensity of service to ensure the patient's safety. The patient's presenting symptoms, physical exam findings, and initial radiographic and laboratory data in the context of their chronic comorbidities is felt to place them at high risk for further clinical deterioration. Furthermore, it is not anticipated that the patient will be medically stable for discharge from the hospital within 2 midnights of admission. The following factors support the patient status of inpatient.   " The patient's presenting symptoms include visual changes on numbness of the left side. " The worrisome physical exam findings include no significant focal weakness but visual changes diplopia. " The  initial radiographic and laboratory data are worrisome because of MRI with multiple demyelinating lesions. " The chronic co-morbidities include none.   * I certify that at the point of admission it is my clinical judgment that the patient will require inpatient hospital care spanning beyond 2 midnights from the point of admission due to high intensity of service, high risk for further deterioration and high frequency of surveillance required.Lonia Blood MD [facility] Pager [phone]  If 7PM-7AM, please contact night-coverage [email or web address] Password [facility]  04/10/2019, 12:50 AM

## 2019-04-10 NOTE — Social Work (Addendum)
1:40pm- f/u appointment made for 3/22 at 9:00am (8:30am arrival requested) at [facility] with Dr. Despina Arias. Added to AVS discharge instructions in the f/u providers section.  12:10pm- Message left at [facility] to schedule a new pt appointment with Dr. Despina Arias. Await return call to schedule.   Octavio Graves, MSW, LCSW [facility] Clinical Social Work

## 2019-04-10 NOTE — ED Notes (Signed)
MS   bfast ordered  

## 2019-04-10 NOTE — ED Notes (Signed)
Family at bedside. 

## 2019-04-10 NOTE — Consult Note (Signed)
Neurology Consultation  Reason for Consult: Diplopia, numbness, weakness Referring Physician: Dr. Anitra Lauth  CC: Diplopia, numbness, weakness  History is obtained from: Patient  HPI: Jean Carlson is a 19 y.o. female past medical history only significant for ADHD, presented to the emergency room for evaluation for double vision as well as numbness and weakness predominantly on the left side of the body. She initially told me that her symptoms started about 7 to 8 days ago but her brother was on the phone and provide more history said that her symptoms off and on have been going on for more than 3 weeks. She is for started noticing some difficulty with her left leg.  She plays softball recreationally and has injured her left knee before and thought it was her left knee acting up from that injury but as the days progressed, she started feeling numbness from the knee down on the left leg and had difficulty walking.  She also started noticing difficulty with left arm function.  Her father got her a walker to walk because she was unable to maintain her balance. Following this 3 to 4 days ago, she started noticing double vision.  Predominantly double vision was when she would look to the right.  No double vision looking straight on looking to the left.  She also noted that her right eye does not go all the way out. She also started noticing difficulty with her speech-her speech started to sound more slurred-describes this as sounding as if she has had multiple alcoholic drinks very rapidly and is drunk. Upon asking if she had any difficulty swallowing, she endorses some difficulty swallowing that has been going on for the past 4 to 5 days as well. She was evaluated in the emergency room.  MRI brain and C-spine was done which showed numerous T2 flair hyperintensities of which many were enhancing with gadolinium-highly suggestive of a demyelinating disease process. Neurological consultation was obtained for  further work-up and treatment recommendations.   ROS: Performed in negative except as noted in HPI  Past Medical History:  Diagnosis Date  . ADHD     Family History  Problem Relation Age of Onset  . Hyperlipidemia Maternal Grandmother   . Hypertension Maternal Grandmother   . Diabetes Paternal Grandfather   . Hypertension Paternal Grandfather   . Heart failure Paternal Grandfather     Social History:   reports that she has been smoking. She has never used smokeless tobacco. She reports previous alcohol use. She reports that she does not use drugs.  Medications  Current Facility-Administered Medications:  .  methylPREDNISolone sodium succinate (SOLU-MEDROL) 1,000 mg in sodium chloride 0.9 % 50 mL IVPB, 1,000 mg, Intravenous, Once, Gwyneth Sprout, MD  Current Outpatient Medications:  .  FLUoxetine (PROZAC) 40 MG capsule, Take 40 mg by mouth in the morning., Disp: , Rfl:  .  ibuprofen (ADVIL) 200 MG tablet, Take 600-800 mg by mouth every 6 (six) hours as needed for headache or mild pain., Disp: , Rfl:  .  LO LOESTRIN FE 1 MG-10 MCG / 10 MCG tablet, Take 1 tablet by mouth daily. , Disp: , Rfl:  .  methylphenidate 36 MG PO CR tablet, Take 36 mg by mouth in the morning., Disp: , Rfl:  .  meloxicam (MOBIC) 7.5 MG tablet, Take 1 tablet (7.5 mg total) by mouth daily. (Patient not taking: Reported on 04/09/2019), Disp: 30 tablet, Rfl: 0   Exam: Current vital signs: BP 120/68   Pulse 89  Temp 98.6 F (37 C) (Oral)   Resp 12   Ht 5\' 4"  (1.626 m)   Wt 84.8 kg   LMP 03/17/2019   SpO2 100%   BMI 32.10 kg/m  Vital signs in last 24 hours: Temp:  [98.6 F (37 C)] 98.6 F (37 C) (03/14 1509) Pulse Rate:  [89-90] 89 (03/14 2100) Resp:  [12-16] 12 (03/14 2100) BP: (120-123)/(68-81) 120/68 (03/14 2100) SpO2:  [100 %] 100 % (03/14 2100) Weight:  [84.8 kg] 84.8 kg (03/14 1509) General: Awake alert in no distress HEENT: Normocephalic atraumatic Lungs: Clear Cardiovascular:  Regular rate rhythm Abdomen: Nondistended nontender Extremities warm well perfused Neurological exam Awake alert oriented x3 Speech is dysarthric No aphasia Able to provide clear and coherent history Cranial nerves: Pupils are equal round reactive to light, no evidence of APD,, extraocular movement examination 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  No diplopia on neutral gaze and leftward gaze.  Face appears symmetric.  Facial sensation appears intact.  Tongue is midline palate elevations seem slightly restricted bilaterally. Motor exam: She is antigravity on all fours but left upper and lower extremity are 4 -/5 at the hip, knee and ankle-flexion and extension.  Right upper and lower extremity are full strength. Sensory exam: Is diminished predominantly on the left-left leg severely diminished distally up to the midcalf.  Also diminished his vibratory sense on the left lower extremity.  Sensation is also decreased on the left hemibody at the trunk and the upper extremity in comparison to the right. Coordination: Ataxia noted on all 4 extremities on finger-nose-finger and heel-knee-shin testing. DTRs: She is hyperreflexic 3+ at the biceps and brachioradialis.  She has a positive Hoffmann sign bilaterally.  She is hyperreflexic at the knee and ankles with ankle clonus of 7-8 beats on both ankles.  Toes are upgoing bilaterally.  There is no crossed adductor reflex on tapping the right thigh but there is a crossed adductor reflex on tapping the left thigh.  Labs I have reviewed labs in epic and the results pertinent to this consultation are: CBC    Component Value Date/Time   WBC 4.9 04/09/2019 1702   RBC 5.19 (H) 04/09/2019 1702   HGB 15.0 04/09/2019 1702   HCT 44.0 04/09/2019 1702   PLT 210 04/09/2019 1702   MCV 84.8 04/09/2019 1702   MCH 28.9 04/09/2019 1702   MCHC 34.1 04/09/2019 1702   RDW 11.9 04/09/2019 1702   LYMPHSABS 1.8 04/09/2019 1702   MONOABS 0.5  04/09/2019 1702   EOSABS 0.2 04/09/2019 1702   BASOSABS 0.0 04/09/2019 1702   CMP     Component Value Date/Time   NA 138 04/09/2019 1702   K 4.7 04/09/2019 1702   CL 105 04/09/2019 1702   CO2 22 04/09/2019 1702   GLUCOSE 81 04/09/2019 1702   BUN 9 04/09/2019 1702   CREATININE 0.77 04/09/2019 1702   CALCIUM 9.1 04/09/2019 1702   PROT 6.6 04/09/2019 1702   ALBUMIN 4.0 04/09/2019 1702   AST 31 04/09/2019 1702   ALT 19 04/09/2019 1702   ALKPHOS 60 04/09/2019 1702   BILITOT 1.1 04/09/2019 1702   GFRNONAA >60 04/09/2019 1702   GFRAA >60 04/09/2019 1702   Imaging I have reviewed the images obtained: MRI examination of the brain-numerous supratentorial and infratentorial T2/FLAIR hyperintensities with multiple lesions that are enhancing and the most prominent of these lesions in the right frontal periventricular white matter measures around 2 cm with mild edema surrounding the lesion  as well as shows ring enhancement-almost looks tumefactive appearing.  There are multiple lesions in the brainstem including in the right abducens nucleus area in the pons with enhancement. MRI examination of the C-spine also shows multiple lesions-extensive but not spanning up to 3 segments, no enhancement in the C-spine.  Assessment: Patient is a 19 year old woman with above past medical history presenting to the emergency room for evaluation of diplopia and numbness and weakness of the left side of the body. Imaging reveals numerous T2 flair hyperintense lesions consistent with demyelination and a lot of those lesions showing enhancement with gadolinium administration pointing towards an active inflammatory/demyelination process. There is dissemination in space and time based on the numerous enhancing and nonenhancing lesions in both supratentorial and infratentorial white matter as well as juxtacortical and in the C-spine. Differentials would include multiple sclerosis and neuromyelitis optica spectrum  disorder.  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  Chest x-ray-pending If both of them are unremarkable for acute process, start IV methylprednisolone 1 g daily for 5 days. Frequent neurochecks She will need PT OT She will need referral to Dr. Olive Bass at [facility] neurology to establish care and recommendations on outpatient disease modifying therapy.  I would imagine that she would be a candidate for Tysabri or Ocrevus given the severity of the disease.  Discussed my plan with her and her brother who was on the phone call was a conference call and answered all the questions.  -- Milon Dikes, MD [facility] Pager: [phone] If 7pm to 7am, please call on call as listed on AMION.

## 2019-04-11 NOTE — Evaluation (Signed)
Occupational Therapy Evaluation Patient Details Name: Jean Carlson MRN: [id number] DOB: 2000/11/07 Today's Date: 04/11/2019    History of Present Illness Pt is a 19 yo s/p vision changes, L side weakness and numbness, recent fall x3 weeks ago. 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 PMHx: ADHD.   Clinical Impression   Pt PTA:  Pt was independent prior to 3 weeks ago as pt had a fall requiring knee immobilizer and assist with IADLs. In the past week, pt has required increased assist due to visual changes and L sided weakness. Pt currently limited by inpulsivity, decreased strength, decreased activity tolerance, decreased ability to care for self safely.  Pt unable to stand upright for >10 secs at a time as pt's  BUEs fatigue. Pt with safety deficits and impulsivity- questionable ADHD at baseline side effects. Pt overall minA for ADL. Pt transfers with minA over and ambulating with RW a household distance. Pt tolerating session well. Pt's mother in room and keeps pt on track. Pt would benefit from continued OT skilled services. OT following acutely.     Follow Up Recommendations  CIR;Supervision/Assistance - 24 hour(initially 24/7; pt will most likely progress to d/c home)    Equipment Recommendations  Wheelchair (measurements OT)(make sure pt has transport chair vs w/c at home)    Recommendations for Other Services PT consult     Precautions / Restrictions Precautions Precautions: Fall;Other (comment) Required Braces or Orthoses: Knee Immobilizer - Left Restrictions Weight Bearing Restrictions: No      Mobility Bed Mobility Overal bed mobility: Needs Assistance Bed Mobility: Supine to Sit;Sit to Supine     Supine to sit: Min guard Sit to supine: Min assist   General bed mobility comments: Assist for sequencing and for BLE management  Transfers Overall transfer level: Needs assistance Equipment used:  Rolling walker (2 wheeled) Transfers: Sit to/from Stand Sit to Stand: Min guard;Min assist         General transfer comment: MinA overall for safety; cues for proper hand placement    Balance Overall balance assessment: Needs assistance Sitting-balance support: Bilateral upper extremity supported Sitting balance-Leahy Scale: Fair       Standing balance-Leahy Scale: Poor Standing balance comment: reliant on RW with BUEs; impulsive; cues for sequencing.                           ADL either performed or assessed with clinical judgement   ADL Overall ADL's : Needs assistance/impaired Eating/Feeding: Modified independent;Sitting   Grooming: Minimal assistance;Standing Grooming Details (indicate cue type and reason): Pt unable to stand upright for >10 secs at a time as pt's  BUEs fatigue Upper Body Bathing: Set up;Sitting   Lower Body Bathing: Minimal assistance;Sitting/lateral leans;Sit to/from stand;Cueing for safety   Upper Body Dressing : Set up;Sitting   Lower Body Dressing: Minimal assistance;Sitting/lateral leans;Sit to/from stand   Toilet Transfer: Minimal assistance;Ambulation;Regular Toilet;Grab bars Toilet Transfer Details (indicate cue type and reason): minA to avoid plopping; pt is very impulsive Toileting- Clothing Manipulation and Hygiene: Minimal assistance;Sitting/lateral lean;Sit to/from stand Toileting - Clothing Manipulation Details (indicate cue type and reason): Cues to use RW for stability     Functional mobility during ADLs: Min guard;Minimal assistance;Rolling walker;Cueing for safety;Cueing for sequencing General ADL Comments: Pt limited by inpulsivity, decreased strength, decreased activity tolerance, decreased ability to care for self safely.      Vision Baseline Vision/History: Wears glasses Wears Glasses:  At all times Patient Visual Report: No change from baseline Vision Assessment?: Vision impaired- to be further tested in  functional context;Yes Eye Alignment: Within Functional Limits Ocular Range of Motion: Within Functional Limits Alignment/Gaze Preference: Within Defined Limits Tracking/Visual Pursuits: Able to track stimulus in all quads without difficulty Additional Comments: Continue to check vision     Perception     Praxis      Pertinent Vitals/Pain Pain Assessment: 0-10 Pain Score: 2  Pain Location: back of head Pain Descriptors / Indicators: Discomfort Pain Intervention(s): Limited activity within patient's tolerance     Hand Dominance Right   Extremity/Trunk Assessment Upper Extremity Assessment Upper Extremity Assessment: Generalized weakness;LUE deficits/detail LUE Deficits / Details: Slight weakness noted 3+/5 MM grade; incoordinated movements at time LUE Sensation: decreased light touch LUE Coordination: decreased fine motor;decreased gross motor   Lower Extremity Assessment Lower Extremity Assessment: Defer to PT evaluation;LLE deficits/detail LLE Deficits / Details: weakness and KI applied LLE Sensation: decreased light touch   Cervical / Trunk Assessment Cervical / Trunk Assessment: Normal   Communication Communication Communication: No difficulties   Cognition Arousal/Alertness: Awake/alert Behavior During Therapy: WFL for tasks assessed/performed;Impulsive Overall Cognitive Status: Within Functional Limits for tasks assessed                                 General Comments: Pt able to follow commands. A/O x4. Impulsive with transfers.   General Comments  Pt's mother in room; Pt ambulating in room with RW with minA; assist required for stability; BUEs tire easily.    Exercises     Shoulder Instructions      Home Living Family/patient expects to be discharged to:: Private residence Living Arrangements: Parent;Other relatives(grandma) Available Help at Discharge: Family;Available 24 hours/day Type of Home: House Home Access: Stairs to enter      Home Layout: One level     Bathroom Shower/Tub: Tub/shower unit;Curtain   Firefighter: Standard Bathroom Accessibility: No   Home Equipment: Shower seat;Grab bars - toilet;Grab bars - tub/shower;Bedside commode;Walker - 4 wheels;Walker - 2 wheels;Transport chair   Additional Comments: family is working on a ramp to entrance of front door      Prior Functioning/Environment Level of Independence: Needs assistance  Gait / Transfers Assistance Needed: KI at all times ADL's / Homemaking Assistance Needed: Independent with most ADL; before fall 3 weeks ago was completely independent   Comments: Recent fall x3 weeks ago; KI issued by orthopedic MD. The last week pt vision, L side weakness deficits.        OT Problem List: Decreased strength;Impaired balance (sitting and/or standing);Decreased range of motion;Decreased activity tolerance;Decreased safety awareness;Decreased coordination;Pain;Increased edema;Decreased knowledge of use of DME or AE      OT Treatment/Interventions: Self-care/ADL training;Therapeutic exercise;Neuromuscular education;Energy conservation;DME and/or AE instruction;Therapeutic activities;Visual/perceptual remediation/compensation;Patient/family education;Balance training;Cognitive remediation/compensation    OT Goals(Current goals can be found in the care plan section) Acute Rehab OT Goals Patient Stated Goal: to be independent OT Goal Formulation: With patient Time For Goal Achievement: 04/25/19 Potential to Achieve Goals: Good ADL Goals Pt Will Perform Grooming: with supervision;standing Pt Will Perform Lower Body Dressing: with supervision;sit to/from stand;sitting/lateral leans Pt Will Transfer to Toilet: with supervision;ambulating;regular height toilet;grab bars Pt Will Perform Toileting - Clothing Manipulation and hygiene: with supervision;sitting/lateral leans;sit to/from stand Pt Will Perform Tub/Shower Transfer: with min guard assist;rolling  walker;ambulating Pt/caregiver will Perform Home Exercise Program: Increased strength;Left upper extremity;With written HEP provided  Additional ADL Goal #1: Pt will participate in visual perception exercises to further assess vision.  OT Frequency: Min 3X/week   Barriers to D/C:            Co-evaluation              AM-PAC OT "6 Clicks" Daily Activity     Outcome Measure Help from another person eating meals?: None Help from another person taking care of personal grooming?: A Little Help from another person toileting, which includes using toliet, bedpan, or urinal?: A Little Help from another person bathing (including washing, rinsing, drying)?: A Lot Help from another person to put on and taking off regular upper body clothing?: A Little Help from another person to put on and taking off regular lower body clothing?: A Little 6 Click Score: 18   End of Session Equipment Utilized During Treatment: Gait belt;Rolling walker Nurse Communication: Mobility status  Activity Tolerance: Patient tolerated treatment well;Other (comment)(anxious at times) Patient left: in bed;with call bell/phone within reach;with bed alarm set;with family/visitor present  OT Visit Diagnosis: Unsteadiness on feet (R26.81);Muscle weakness (generalized) (M62.81);Pain Pain - Right/Left: Left Pain - part of body: Knee                Time: 1610-1640 OT Time Calculation (min): 30 min Charges:  OT General Charges $OT Visit: 1 Visit OT Evaluation $OT Eval Moderate Complexity: 1 Mod OT Treatments $Self Care/Home Management : 8-22 mins  Flora Lipps, OTR/L Acute Rehabilitation Services Pager: [phone] Office: [phone]   Rokia Bosket C 04/11/2019, 5:29 PM

## 2019-04-11 NOTE — TOC Initial Note (Signed)
Transition of Care [facility]) - Initial/Assessment Note    Patient Details  Name: Jean Carlson MRN: [id number] Date of Birth: 09/30/2000  Transition of Care [facility]) CM/SW Contact:    Kingsley Plan, RN Phone Number: 04/11/2019, 3:19 PM  Clinical Narrative:                 Patient from home. Patient has PCP DR Redmon, and has follow up appointment with neurology.   Patient does have BCBS, called admitting to notify and faxed copy of insurance card.  Expected Discharge Plan: Home/Self Care Barriers to Discharge: Continued Medical Work up   Patient Goals and CMS Choice Patient states their goals for this hospitalization and ongoing recovery are:: to return home CMS Medicare.gov Compare Post Acute Care list provided to:: Patient Choice offered to / list presented to : Patient  Expected Discharge Plan and Services Expected Discharge Plan: Home/Self Care   Discharge Planning Services: CM Consult   Living arrangements for the past 2 months: Single Family Home                 DME Arranged: N/A         HH Arranged: NA          Prior Living Arrangements/Services Living arrangements for the past 2 months: Single Family Home Lives with:: Relatives Patient language and need for interpreter reviewed:: Yes Do you feel safe going back to the place where you live?: Yes      Need for Family Participation in Patient Care: Yes (Comment) Care giver support system in place?: Yes (comment)   Criminal Activity/Legal Involvement Pertinent to Current Situation/Hospitalization: No - Comment as needed  Activities of Daily Living      Permission Sought/Granted   Permission granted to share information with : Yes, Verbal Permission Granted  Share Information with NAME: mother           Emotional Assessment Appearance:: Appears stated age Attitude/Demeanor/Rapport: Engaged Affect (typically observed): Accepting Orientation: : Oriented to Self, Oriented to Place, Oriented to   Time, Oriented to Situation Alcohol / Substance Use: Not Applicable Psych Involvement: No (comment)  Admission diagnosis:  Multiple sclerosis (HCC) [G35] Diplopia [H53.2] Ataxia [R27.0] MS (multiple sclerosis) (HCC) [G35] Weakness of left upper extremity [R29.898] Weakness of left lower extremity [R29.898] Patient Active Problem List   Diagnosis Date Noted  . MS (multiple sclerosis) (HCC) 04/10/2019  . ADD (attention deficit disorder) 04/10/2019  . Tobacco abuse 04/10/2019   PCP:  Milus Height, PA Pharmacy:   [facility] [address] [place] - [address] [place] [address] Phone: [phone] Fax: [phone]  [facility] [place], [place] - [address] [place] [address] Phone: [phone] Fax: [phone]     Social Determinants of Health (SDOH) Interventions    Readmission Risk Interventions No flowsheet data found.

## 2019-04-11 NOTE — Progress Notes (Signed)
Hospitalist progress note   Patient from home  and, Patient going likely home, Dispo needs to complete IV steroids 04/14/2019 and awaiting therapy eval but may be able to go home-continue SCDs for DVT prophylaxis  SHENICE DOLDER [id number] DOB: [birth year] DOA: 04/09/2019  PCP: Patient, No Pcp Per   Narrative:  19 year old female prior ADHD, came to emergency room 3/14 with left upper extremity numbness left lower extremity weakness slurred speech and double vision MRI brain showed scattered multiple white matter lesions Neurology saw the patient-as infectious work-up UA CXR negative patient started on Solu-Medrol which will be needed for 5 days  Data Reviewed:  Potassium 3.4 Random glucose 159 Rest of labs relatively normal  Assessment & Plan:  New onset multiple sclerosis Complete IV steroids as per direction of neurology on 04/14/2019 1 g Solu-Medrol daily Because of improving weakness suspect she can probably discharge home however will need therapy services to see her as she would be required to assist to bedside commode Saline lock today and discontinue monitors Prior history ADHD Continue Concerta 36 CR every morning, Prozac Contraception Continue Loestrin 1 tab daily  Had a long discussion with patient's mother on the telephone and today spoke to her again and left patient handout regarding multiple sclerosis  Subjective: Awake coherent no distress eating drinking still somewhat weak needed assistance getting up to bedside No fever Sensation is returning in lower extremities Consultants:   Neurology  Objective: Vitals:   04/10/19 0955 04/10/19 1222 04/10/19 2322 04/11/19 0537  BP: 138/77 123/78 132/74 (!) 101/49  Pulse: 99 100 (!) 101 82  Resp: 18 18 16 16   Temp: 98.2 F (36.8 C) 98.2 F (36.8 C) 99.1 F (37.3 C) 98.7 F (37.1 C)  TempSrc: Oral Oral Oral   SpO2: 100% 99% 98% 97%  Weight:      Height:        Intake/Output Summary (Last 24 hours) at 04/11/2019  1016 Last data filed at 04/11/2019 0000 Gross per 24 hour  Intake 1653.71 ml  Output --  Net 1653.71 ml   Filed Weights   04/09/19 1509  Weight: 84.8 kg    Examination: Alert obese pleasant no distress EOMI NCAT no focal deficit S1-S2 no murmur rub or gallop Multiple tattoos Abdomen soft nontender no rebound no guarding ROM intact Power now 5/5 and seems to have improved from first day hospitalized   Scheduled Meds: . FLUoxetine  40 mg Oral Daily  . methylphenidate  36 mg Oral q AM  . Norethindrone-Ethinyl Estradiol-Fe Biphas  1 tablet Oral Daily   Continuous Infusions: . sodium chloride Stopped (04/10/19 1655)  . methylPREDNISolone (SOLU-MEDROL) injection 1,000 mg (04/11/19 0940)     LOS: 1 day   Time spent: 40 31, MD [facility]  04/11/2019, 10:16 AM

## 2019-04-11 NOTE — Progress Notes (Signed)
Subjective: She feels that she is improving with steroids  Exam: Vitals:   04/10/19 2322 04/11/19 0537  BP: 132/74 (!) 101/49  Pulse: (!) 101 82  Resp: 16 16  Temp: 99.1 F (37.3 C) 98.7 F (37.1 C)  SpO2: 98% 97%   Gen: In bed, NAD Resp: non-labored breathing, no acute distress Abd: soft, nt  Neuro: MS: Awake, alert, interactive and appropriate CN: Right abducens palsy, pupils equal round and reactive Motor: 4/5 on the left, good strength in the right Sensory: Diminished in the left foot, intact in the mid calf to light touch  Pertinent Labs: NMO-pending  Imaging reviewed: Extensive T2 change with lesions some of which are enhancing.    Impression: 19 year old female with monophasic clinical illness with multiple lesions on MRI some of which are enhancing and some which are not.  The clinical appearance is somewhat reminiscent of acute disseminated encephalomyelitis(ADEM), but the presence of nonenhancing lesions would argue against this.  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  In any case, the current management is steroids and she appears to be improving with them which is reassuring.  She will need 5 days of IV Solu-Medrol and then be followed up as an outpatient for starting disease modifying therapy.  Recommendations: 1) continue IV Solu-Medrol 2) PT/OT  Ritta Slot, MD [facility] [phone]  If 7pm- 7am, please page neurology on call as listed in AMION.

## 2019-04-12 DIAGNOSIS — G379 Demyelinating disease of central nervous system, unspecified: Secondary | ICD-10-CM

## 2019-04-12 DIAGNOSIS — F909 Attention-deficit hyperactivity disorder, unspecified type: Secondary | ICD-10-CM

## 2019-04-12 LAB — NEUROMYELITIS OPTICA AUTOAB, IGG: NMO-IgG: <1.5 U/mL (ref 0.0–3.0)

## 2019-04-12 NOTE — Progress Notes (Signed)
Rehab Admissions Coordinator Note:  Patient was screened by Jean Carlson for appropriateness for an Inpatient Acute Rehab Consult per therapy recommendations.  At this time, we are recommending Inpatient Rehab consult. I will place order per protocol.  Jean Dupes RN MSN 04/12/2019, 1:07 PM  I can be reached at [phone].

## 2019-04-12 NOTE — Evaluation (Signed)
Physical Therapy Evaluation Patient Details Name: Jean Carlson MRN: [id number] DOB: [birth year] Today's Date: 04/12/2019   History of Present Illness  Pt is a 19 yo s/p vision changes, L side weakness and numbness, recent fall x3 weeks ago. 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 PMHx: ADHD.  Clinical Impression   Pt presents with L sided weakness and incoordination, unsteadiness in standing with history of recent falls, impaired gait with R staggering noted in hallway, and decreased activity tolerance. Pt to benefit from acute PT to address deficits. Pt ambulated hallway distance with RW and min assist from PT, with 1 notable LOB with scissoring noted. At baseline, pt plays softball and is completely independent. PT recommending CIR to address mobility deficits and maximize pt function. Pt with very supportive family, pt's mother present this session and tearful because she wants answers for her daughter. PT to progress mobility as tolerated, and will continue to follow acutely.      Follow Up Recommendations CIR    Equipment Recommendations  Other (comment)(TBD)    Recommendations for Other Services Rehab consult     Precautions / Restrictions Precautions Precautions: Fall;Other (comment) Required Braces or Orthoses: Knee Immobilizer - Left Restrictions Weight Bearing Restrictions: No      Mobility  Bed Mobility Overal bed mobility: Needs Assistance Bed Mobility: Supine to Sit;Sit to Supine     Supine to sit: Min guard;HOB elevated Sit to supine: Min guard;HOB elevated   General bed mobility comments: min guard for supine<>sit, pt with use of HOB elevation and hooking RLE under LLE as well as physically lifting LLE. Increased time and effort.  Transfers Overall transfer level: Needs assistance Equipment used: Rolling walker (2 wheeled) Transfers: Sit to/from Stand Sit to Stand: Min assist          General transfer comment: Min assist to steady upon standing, pt encouraged to slow sequencing when rising for safety as pt tends to move quickly. verbal cuing for hand placement when rising. STS x2, from EOB and from toilet.  Ambulation/Gait Ambulation/Gait assistance: Min assist;+2 safety/equipment Gait Distance (Feet): 75 Feet(2x75 ft, standing rest break) Assistive device: Rolling walker (2 wheeled) Gait Pattern/deviations: Step-through pattern;Decreased stride length;Staggering right;Decreased dorsiflexion - left;Decreased weight shift to left Gait velocity: decr   General Gait Details: Min assist for steadying, physically guiding pt and RW as pt with heavy R stagger in hallway. External cuing with use of floor tiles to control LLE step length and width, to improve coordination during gait. Pt with use of momentum during LLE swing phase.  Stairs            Wheelchair Mobility    Modified Rankin (Stroke Patients Only)       Balance Overall balance assessment: Needs assistance Sitting-balance support: Bilateral upper extremity supported Sitting balance-Leahy Scale: Fair       Standing balance-Leahy Scale: Poor Standing balance comment: reliant on external support                             Pertinent Vitals/Pain Pain Assessment: No/denies pain Pain Score: 0-No pain Pain Intervention(s): Limited activity within patient's tolerance;Monitored during session    Home Living Family/patient expects to be discharged to:: Private residence Living Arrangements: Parent;Other relatives(grandma) Available Help at Discharge: Family;Available 24 hours/day(parents taking short term time off, up to 1 month) Type of Home: House Home Access: Stairs to enter  Home Layout: One level Home Equipment: Shower seat;Grab bars - toilet;Grab bars - tub/shower;Bedside commode;Walker - 4 wheels;Walker - 2 wheels;Transport chair Additional Comments: family is working on a  ramp to entrance of front door    Prior Function Level of Independence: Needs assistance   Gait / Transfers Assistance Needed: KI at all times for 3 weeks, per pt is waiting for ortho appointment  ADL's / Homemaking Assistance Needed: Independent with most ADL; before fall 3 weeks ago was completely independent  Comments: Recent fall x3 weeks ago; KI issued by orthopedic MD. The last week pt vision, L side weakness deficits, intermittent slurred speech. Pt enjoys playing softball, does not work currently     Higher education careers adviser   Dominant Hand: Right    Extremity/Trunk Assessment   Upper Extremity Assessment Upper Extremity Assessment: LUE deficits/detail LUE Deficits / Details: functional weakness noted, incoordinated LUE Sensation: decreased light touch LUE Coordination: decreased gross motor;decreased fine motor    Lower Extremity Assessment Lower Extremity Assessment: LLE deficits/detail LLE Deficits / Details: MMT: 3/5 - knee extension, knee flexion, DF/PF. 2+/5 - hip flexion. VERY incoordinated with impaired ability to grade motor response LLE Sensation: decreased light touch LLE Coordination: decreased fine motor;decreased gross motor    Cervical / Trunk Assessment Cervical / Trunk Assessment: Normal  Communication   Communication: No difficulties  Cognition Arousal/Alertness: Awake/alert Behavior During Therapy: WFL for tasks assessed/performed;Impulsive Overall Cognitive Status: Within Functional Limits for tasks assessed                                 General Comments: Periods of impulsivity, lacks insight into deficits.      General Comments      Exercises     Assessment/Plan    PT Assessment Patient needs continued PT services  PT Problem List Decreased strength;Decreased mobility;Decreased safety awareness;Decreased coordination;Decreased activity tolerance;Decreased balance;Decreased knowledge of use of DME       PT Treatment  Interventions DME instruction;Therapeutic activities;Gait training;Therapeutic exercise;Patient/family education;Balance training;Functional mobility training;Neuromuscular re-education    PT Goals (Current goals can be found in the Care Plan section)  Acute Rehab PT Goals Patient Stated Goal: to be independent PT Goal Formulation: With patient Time For Goal Achievement: 04/26/19 Potential to Achieve Goals: Good    Frequency Min 3X/week   Barriers to discharge        Co-evaluation               AM-PAC PT "6 Clicks" Mobility  Outcome Measure Help needed turning from your back to your side while in a flat bed without using bedrails?: A Little Help needed moving from lying on your back to sitting on the side of a flat bed without using bedrails?: A Little Help needed moving to and from a bed to a chair (including a wheelchair)?: A Little Help needed standing up from a chair using your arms (e.g., wheelchair or bedside chair)?: A Little Help needed to walk in hospital room?: A Little Help needed climbing 3-5 steps with a railing? : A Lot 6 Click Score: 17    End of Session Equipment Utilized During Treatment: Gait belt;Other (comment)(R knee brace) Activity Tolerance: Patient limited by fatigue;Patient tolerated treatment well Patient left: in bed;with call bell/phone within reach;with bed alarm set;with family/visitor present Nurse Communication: Mobility status PT Visit Diagnosis: Other abnormalities of gait and mobility (R26.89);Muscle weakness (generalized) (M62.81);Other symptoms and signs involving the nervous system (R29.898)  Time: 0981-1914 PT Time Calculation (min) (ACUTE ONLY): 29 min   Charges:   PT Evaluation $PT Eval Low Complexity: 1 Low PT Treatments $Gait Training: 8-22 mins       Lucerito Rosinski E, PT Acute Rehabilitation Services Pager [phone]  Office [phone]  Jaely Silman D Despina Hidden 04/12/2019, 12:15 PM

## 2019-04-12 NOTE — Social Work (Signed)
CSW contacted by Carlyon Shadow, questions about disability placard. Paperwork located on [employer], printed and sent to floor. Pt will need to fill these out and have MD complete the appropriate portion. Did let Pieter Partridge know that I am not sure if hospitalist would sign that paperwork and that it may need to be completed by neurology MD or PCP.   Per DMV website placards are $5: - Pt must send in document provided.  - Pt must provide documentation verifying identity. Valid for five years. - Be signed by an authorized representative of the Division of Services for the Blind. - Must be recertified by a physician every five years.  - Medical recertification not required for persons 3 years of age or older at the time of renewal.  Octavio Graves, MSW, LCSW [place] [facility]

## 2019-04-12 NOTE — Progress Notes (Signed)
PROGRESS NOTE  Jean Carlson JSE:[id number] DOB: 07-Nov-2000 DOA: 04/09/2019 PCP: Lennie Odor, PA  Brief History   19 year old woman presented with several day history of diplopia, difficulty speaking and swallowing, left-sided weakness.  MRI of the brain and cervical spine showed multiple lesions suggestive of demyelinating disorder.  Admitted for further evaluation, seen by neurology and started on high-dose IV steroids.  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 --Seen by neurology, started on high-dose methylprednisolone for 5 days, to complete 3/19. --Outpatient referral made to Dr. Richardean Chimera [place] neurology for consideration of disease modifying therapy.  ADHD --Continue Concerta and Prozac  Tobacco use disorder --Nicotine patch  Disposition Plan:  From: home Anticipated disposition: CIR? Discussion: Patient requires high-dose IV steroids and close monitoring for 5 days, thereafter she may require CIR, consultation in process.  DVT prophylaxis: SCDs Code Status: Full Family Communication: Mother by telephone as requested by patient    Murray Hodgkins, MD  [facility] Direct contact: see [email or web address] (further directions at bottom of note if needed) 7PM-7AM contact night coverage as at bottom of note 04/12/2019, 4:02 PM  LOS: 2 days   Significant Hospital Events   . 3/14 admitted for further evaluation of suspected demyelinating disorder   Consults:  . Neurology   Procedures:  .   Significant Diagnostic Tests:  . SARS-CoV-2 negative . Chest x-ray no acute disease . CT head no acute disease . MRI brain and cervical spine: Multiple white matter lesions scattered throughout the brain and cervical spinal cord most consistent  with demyelinating disease.   Micro Data:  .    Antimicrobials:  .   Interval History/Subjective  A little better today with improved strength in her left hand.  Left lower leg is still numb, especially in the foot.  Does not feel like she can walk easily on the left foot because of numbness.  Speaking okay.  Objective   Vitals:  Vitals:   04/12/19 0749 04/12/19 1200  BP: (!) 112/57 134/70  Pulse: 84 94  Resp: 16 18  Temp: 98.6 F (37 C) 98.3 F (36.8 C)  SpO2: 98% 98%    Exam:  Constitutional.  Appears comfortable, anxious, nontoxic. Respiratory.  Clear to auscultation bilaterally.  No wheezes, rales or rhonchi.  Normal respiratory effort. Cardiovascular.  Regular rate and rhythm.  No murmur, rub or gallop.  No lower extremity edema. Musculoskeletal.  Able to move both arms and legs to command.  Some tremor left hand. Neurologic.  Very slight difficulty speaking at times.  See musculoskeletal as above.  I have personally reviewed the following:   Today's Data  . No new data  Scheduled Meds: . FLUoxetine  40 mg Oral Daily  . methylphenidate  36 mg Oral q AM  . Norethindrone-Ethinyl Estradiol-Fe Biphas  1 tablet Oral Daily   Continuous Infusions: . methylPREDNISolone (SOLU-MEDROL) injection 1,000 mg (04/12/19 0904)    Principal Problem:   Demyelinating disorder (HCC) Active Problems:   MS (multiple sclerosis) ([place])   Tobacco abuse   ADHD   LOS: 2 days   How to contact the [facility] Attending or Consulting provider [place] or covering provider during after hours [place], for this patient?  1. Check the care team in [facility] and look for a) attending/consulting [facility] provider listed  and b) the [facility] team listed 2. Log into [email or web address] and use [place]'s universal password to access. If you do not have the password, please contact the hospital operator. 3. Locate the [facility] provider you are looking for under [facility] and page to a number that you can be directly  reached. 4. If you still have difficulty reaching the provider, please page the [facility] (Director on Call) for the Hospitalists listed on amion for assistance.

## 2019-04-12 NOTE — Consult Note (Signed)
Physical Medicine and Rehabilitation Consult Reason for Consult: Left side weakness and numbness Referring Physician: [facility]   HPI: Jean Carlson is a 19 y.o. right-handed female with history of ADHD maintained on Concerta and tobacco abuse.  Per chart review patient lives with her grandmother.  1 level home.  Needed assistance prior to admission.  Presented 04/09/2019 with double vision and numbness as well as weakness to the left side that had progressed over the last 3 weeks.  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  Multiple lesions within the brainstem.  No hemorrhage midline shift or hydrocephalus.  No cervical spinal canal stenosis.  Admission chemistries were unremarkable.  Neurology follow-up placed on 5 days Solu-Medrol.  Therapy evaluations completed with recommendations of physical medicine rehab consult.   Review of Systems  Constitutional: Negative for chills and fever.  HENT: Negative for hearing loss.   Eyes: Positive for blurred vision and double vision.  Respiratory: Negative for cough and shortness of breath.   Cardiovascular: Positive for leg swelling. Negative for chest pain and palpitations.  Gastrointestinal: Positive for constipation. Negative for heartburn, nausea and vomiting.  Genitourinary: Negative for dysuria, flank pain and hematuria.  Musculoskeletal: Positive for myalgias.  Skin: Negative for rash.  Neurological: Positive for weakness.  All other systems reviewed and are negative.  Past Medical History:  Diagnosis Date  . ADHD    History reviewed. No pertinent surgical history. Family History  Problem Relation Age of Onset  . Hyperlipidemia Maternal Grandmother   . Hypertension Maternal Grandmother   . Diabetes Paternal Grandfather   . Hypertension Paternal Grandfather   . Heart failure  Paternal Grandfather    Social History:  reports that she has been smoking. She has never used smokeless tobacco. She reports previous alcohol use. She reports that she does not use drugs. Allergies:  Allergies  Allergen Reactions  . Amoxicillin Other (See Comments)    CAUSED BAD NIGHTMARES  . Codeine Nausea Only    SEVERE NAUSEA  . Vyvanse [Lisdexamfetamine] Other (See Comments)    "Caused depression to the point of self-harm"   Medications Prior to Admission  Medication Sig Dispense Refill  . FLUoxetine (PROZAC) 40 MG capsule Take 40 mg by mouth in the morning.    [name] ibuprofen (ADVIL) 200 MG tablet Take 600-800 mg by mouth every 6 (six) hours as needed for headache or mild pain.    . LO LOESTRIN FE 1 MG-10 MCG / 10 MCG tablet Take 1 tablet by mouth daily.     . methylphenidate 36 MG PO CR tablet Take 36 mg by mouth in the morning.    . meloxicam (MOBIC) 7.5 MG tablet Take 1 tablet (7.5 mg total) by mouth daily. (Patient not taking: Reported on 04/09/2019) 30 tablet 0    Home: Home Living Family/patient expects to be discharged to:: Private residence Living Arrangements: Parent, Other relatives(grandma) Available Help at Discharge: Family, Available 24 hours/day(parents taking short term time off, up to 1 month) Type of Home: House Home Access: Stairs to enter Home Layout: One level Bathroom Shower/Tub: Tub/shower unit, Engineer, building services: Standard Bathroom Accessibility: No Home Equipment: Information systems manager, Grab bars - toilet, Grab bars - tub/shower, Bedside commode, Walker - 4 wheels, Walker - 2 wheels, Transport chair Additional Comments: family is working on a ramp to entrance of front door   Functional History: Prior  Function Level of Independence: Needs assistance Gait / Transfers Assistance Needed: KI at all times for 3 weeks, per pt is waiting for ortho appointment ADL's / Homemaking Assistance Needed: Independent with most ADL; before fall 3 weeks ago was completely  independent Comments: Recent fall x3 weeks ago; KI issued by orthopedic MD. The last week pt vision, L side weakness deficits, intermittent slurred speech. Pt enjoys playing softball, does not work currently Functional Status:  Mobility: Bed Mobility Overal bed mobility: Needs Assistance Bed Mobility: Supine to Sit, Sit to Supine Supine to sit: Min guard, HOB elevated Sit to supine: Min guard, HOB elevated General bed mobility comments: min guard for supine<>sit, pt with use of HOB elevation and hooking RLE under LLE as well as physically lifting LLE. Increased time and effort. Transfers Overall transfer level: Needs assistance Equipment used: Rolling walker (2 wheeled) Transfers: Sit to/from Stand Sit to Stand: Min assist General transfer comment: Min assist to steady upon standing, pt encouraged to slow sequencing when rising for safety as pt tends to move quickly. verbal cuing for hand placement when rising. STS x2, from EOB and from toilet. Ambulation/Gait Ambulation/Gait assistance: Min assist, +2 safety/equipment Gait Distance (Feet): 75 Feet(2x75 ft, standing rest break) Assistive device: Rolling walker (2 wheeled) Gait Pattern/deviations: Step-through pattern, Decreased stride length, Staggering right, Decreased dorsiflexion - left, Decreased weight shift to left General Gait Details: Min assist for steadying, physically guiding pt and RW as pt with heavy R stagger in hallway. External cuing with use of floor tiles to control LLE step length and width, to improve coordination during gait. Pt with use of momentum during LLE swing phase. Gait velocity: decr    ADL: ADL Overall ADL's : Needs assistance/impaired Eating/Feeding: Modified independent, Sitting Grooming: Minimal assistance, Standing Grooming Details (indicate cue type and reason): Pt unable to stand upright for >10 secs at a time as pt's  BUEs fatigue Upper Body Bathing: Set up, Sitting Lower Body Bathing: Minimal  assistance, Sitting/lateral leans, Sit to/from stand, Cueing for safety Upper Body Dressing : Set up, Sitting Lower Body Dressing: Minimal assistance, Sitting/lateral leans, Sit to/from stand Toilet Transfer: Minimal assistance, Ambulation, Regular Toilet, Grab bars Toilet Transfer Details (indicate cue type and reason): minA to avoid plopping; pt is very impulsive Toileting- Clothing Manipulation and Hygiene: Minimal assistance, Sitting/lateral lean, Sit to/from stand Toileting - Clothing Manipulation Details (indicate cue type and reason): Cues to use RW for stability Functional mobility during ADLs: Min guard, Minimal assistance, Rolling walker, Cueing for safety, Cueing for sequencing General ADL Comments: Pt limited by inpulsivity, decreased strength, decreased activity tolerance, decreased ability to care for self safely.   Cognition: Cognition Overall Cognitive Status: Within Functional Limits for tasks assessed Orientation Level: Oriented X4 Cognition Arousal/Alertness: Awake/alert Behavior During Therapy: WFL for tasks assessed/performed, Impulsive Overall Cognitive Status: Within Functional Limits for tasks assessed General Comments: Periods of impulsivity, lacks insight into deficits.  Blood pressure 134/70, pulse 94, temperature 98.3 F (36.8 C), temperature source Oral, resp. rate 18, height 5\' 4"  (1.626 m), weight 84.8 kg, last menstrual period 03/17/2019, SpO2 98 %.  Physical Exam   General: Alert and oriented x 3, No apparent distress, anxious HEENT: Head is normocephalic, atraumatic, PERRLA, EOMI, sclera anicteric, oral mucosa pink and moist, dentition intact, ext ear canals clear,  Neck: Supple without JVD or lymphadenopathy Heart: Reg rate and rhythm. No murmurs rubs or gallops Chest: CTA bilaterally without wheezes, rales, or rhonchi; no distress Abdomen: Soft, non-tender, non-distended, bowel sounds positive. Extremities:  No clubbing, cyanosis, or edema. Pulses are  2+ Skin: Clean and intact without signs of breakdown Neuro: Patient is alert.  She is somewhat impulsive.  Follows commands.  Oriented x3. Mild dysarthria. 5/5 strength throughout right side.  4/5 on left side with 4-/5 left hand grip. Diminished sensation over left foot.  Psych: Verbose, tanggential. Pt is cooperative. Anxious.   No results found for this or any previous visit (from the past 24 hour(s)). No results found.   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 Does the need for close, 24 hr/day medical supervision in concert with the patient's rehab needs make it unreasonable for this patient to be served in a less intensive setting? Yes 2. Co-Morbidities requiring supervision/potential complications: ADHD, tobacco use disorder, generalized anxiety disorder 3. Due to bladder management, bowel management, safety, skin/wound care, disease management, medication administration, pain management and patient education, does the patient require 24 hr/day rehab nursing? Yes 4. Does the patient require coordinated care of a physician, rehab nurse, therapy disciplines of PT, OT to address physical and functional deficits in the context of the above medical diagnosis(es)? Yes Addressing deficits in the following areas: balance, endurance, locomotion, strength, transferring, bowel/bladder control, bathing and dressing 5. Can the patient actively participate in an intensive therapy program of at least 3 hrs of therapy per day at least 5 days per week? Yes 6. The potential for patient to make measurable gains while on inpatient rehab is excellent 7. Anticipated functional outcomes upon discharge from inpatient rehab are modified independent  with PT,  modified independent with OT, independent with SLP. 8. Estimated rehab length of stay to reach the above functional goals is: 10-14 days 9. Anticipated discharge destination: Home 10. Overall Rehab/Functional Prognosis: excellent  RECOMMENDATIONS: This patient's condition is appropriate for continued rehabilitative care in the following setting: CIR Patient has agreed to participate in recommended program. Yes Note that insurance prior authorization may be required for reimbursement for recommended care.  Comment: Mrs. Creed would be an excellent CIR candidate and is agreeable to CIR admission. She has good family support.  Mcarthur Rossetti Angiulli, PA-C 04/12/2019   I have personally performed a face to face diagnostic evaluation, including, but not limited to relevant history and physical exam findings, of this patient and developed relevant assessment and plan.  Additionally, I have reviewed and concur with the physician assistant's documentation above.  Sula Soda, MD

## 2019-04-13 ENCOUNTER — Telehealth: Payer: Self-pay | Admitting: *Deleted

## 2019-04-13 NOTE — Telephone Encounter (Signed)
Dr. Epimenio Foot received message from neurohospitalist that pt in hospital, needing f/u next week for MS.   Tried calling [phone] x2. Got busy signal twice.  Called [phone], LVM for pt to call office to schedule appt  If he calls, please offer 04/19/19 at 4pm with Dr. Epimenio Foot. Slot on hold

## 2019-04-13 NOTE — Progress Notes (Signed)
Occupational Therapy Treatment Patient Details Name: Jean Carlson MRN: [id number] DOB: 07/03/2000 Today's Date: 04/13/2019    History of present illness Pt is a 19 yo s/p vision changes, L side weakness and numbness, recent fall x3 weeks ago. 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 PMHx: ADHD.   OT comments  Pt is highly motivated to return to independence. Continue to have some safety concerns with pt demonstrating impulsivity and decreased insight into her deficits at times and at other times she institutes good compensatory strategies. Completed UB dressing, standing grooming and toileting with min assist.   Follow Up Recommendations  CIR;Supervision/Assistance - 24 hour    Equipment Recommendations  Other (comment)(defer to next venue)    Recommendations for Other Services      Precautions / Restrictions Precautions Precautions: Fall Restrictions Weight Bearing Restrictions: No       Mobility Bed Mobility Overal bed mobility: Needs Assistance Bed Mobility: Supine to Sit;Sit to Supine     Supine to sit: Supervision;HOB elevated Sit to supine: Supervision;HOB elevated   General bed mobility comments: supervision for lV line and safety  Transfers Overall transfer level: Needs assistance Equipment used: Rolling walker (2 wheeled) Transfers: Sit to/from Stand Sit to Stand: Min assist;Min guard         General transfer comment: self cues for hand placement, min to min guard assist to steady    Balance Overall balance assessment: Needs assistance   Sitting balance-Leahy Scale: Fair       Standing balance-Leahy Scale: Poor Standing balance comment: min assist in static standing                           ADL either performed or assessed with clinical judgement   ADL Overall ADL's : Needs assistance/impaired     Grooming: Minimal assistance;Standing;Wash/dry hands;Oral  care Grooming Details (indicate cue type and reason): min assist for standing balance, stood x 3 minutes         Upper Body Dressing : Minimal assistance;Sitting Upper Body Dressing Details (indicate cue type and reason): due to IV line     Toilet Transfer: Minimal assistance;Ambulation;RW;Regular Toilet;Grab bars   Toileting- Clothing Manipulation and Hygiene: Minimal assistance;Sit to/from stand Toileting - Clothing Manipulation Details (indicate cue type and reason): min assist for balance     Functional mobility during ADLs: Minimal assistance;Rolling walker;Cueing for safety       Vision   Additional Comments: reports intact near vision and intermittent diplopia distant vision   Perception     Praxis      Cognition Arousal/Alertness: Awake/alert Behavior During Therapy: Impulsive Overall Cognitive Status: Within Functional Limits for tasks assessed                                 General Comments: self cues hand placement with walker use, using compensatory strategies for poor standing balance at sink, needs safety cues for fall prevention        Exercises     Shoulder Instructions       General Comments      Pertinent Vitals/ Pain       Pain Assessment: No/denies pain  Home Living  Prior Functioning/Environment              Frequency  Min 3X/week        Progress Toward Goals  OT Goals(current goals can now be found in the care plan section)  Progress towards OT goals: Progressing toward goals  Acute Rehab OT Goals Patient Stated Goal: to be independent OT Goal Formulation: With patient Time For Goal Achievement: 04/25/19 Potential to Achieve Goals: Good  Plan Discharge plan remains appropriate    Co-evaluation                 AM-PAC OT "6 Clicks" Daily Activity     Outcome Measure   Help from another person eating meals?: None Help from another  person taking care of personal grooming?: A Little Help from another person toileting, which includes using toliet, bedpan, or urinal?: A Little Help from another person bathing (including washing, rinsing, drying)?: A Lot Help from another person to put on and taking off regular upper body clothing?: A Little Help from another person to put on and taking off regular lower body clothing?: A Lot 6 Click Score: 17    End of Session    OT Visit Diagnosis: Unsteadiness on feet (R26.81);Muscle weakness (generalized) (M62.81)   Activity Tolerance Patient tolerated treatment well   Patient Left in bed;with call bell/phone within reach;with family/visitor present   Nurse Communication          Time: 1203-1229 OT Time Calculation (min): 26 min  Charges: OT General Charges $OT Visit: 1 Visit OT Treatments $Self Care/Home Management : 8-22 mins  Nestor Lewandowsky, OTR/L Acute Rehabilitation Services Pager: [phone] Office: [phone]   Jean Carlson 04/13/2019, 1:20 PM

## 2019-04-13 NOTE — Progress Notes (Signed)
PROGRESS NOTE  Jean Carlson JXB:[id number] DOB: [birth year] DOA: 04/09/2019 PCP: Milus Height, PA  Brief History   19 year old woman presented with several day history of diplopia, difficulty speaking and swallowing, left-sided weakness.  MRI of the brain and cervical spine showed multiple lesions suggestive of demyelinating disorder.  Admitted for further evaluation, seen by neurology and started on high-dose IV steroids.  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 --Seen by neurology, started on high-dose methylprednisolone for 5 days, to complete 3/18 per neurology --Outpatient referral made to Dr. Olive Bass [facility] neurology for consideration of disease modifying therapy.  ADHD --Continue Concerta and Prozac  Tobacco use disorder --Nicotine patch  Disposition Plan:  From: home Anticipated disposition: CIR Discussion: Patient to complete high-dose Solu-Medrol today per neurology.  Medically stable for transfer to CIR.  DVT prophylaxis: SCDs Code Status: Full Family Communication: None present or requested    [name], MD  [facility] Direct contact: see [email or web address] (further directions at bottom of note if needed) 7PM-7AM contact night coverage as at bottom of note 04/13/2019, 11:42 AM  LOS: 3 days   Significant Hospital Events   . 3/14 admitted for further evaluation of suspected demyelinating disorder   Consults:  . Neurology   Procedures:  .   Significant Diagnostic Tests:  . SARS-CoV-2 negative . Chest x-ray no acute disease . CT head no acute disease . MRI brain and cervical spine: Multiple white matter lesions scattered throughout the brain and cervical spinal cord most consistent with demyelinating disease.    Micro Data:  .    Antimicrobials:  .   Interval History/Subjective  Although better today.  Able to use left hand better.  Still having double vision at times.  Left foot still numb.  Objective   Vitals:  Vitals:   04/12/19 2331 04/13/19 0506  BP: 117/74 102/78  Pulse: (!) 57 76  Resp:    Temp: 98.1 F (36.7 C) 98.4 F (36.9 C)  SpO2: 95% 98%    Exam:  Constitutional.  Appears calm, comfortable. Psychiatric.  Grossly normal mood and affect.  Speech fluent and appropriate. Cardiovascular.  Regular rate and rhythm.  No murmur, rub or gallop. Respiratory.  Clear to auscultation bilaterally.  No wheezes, rales or rhonchi.  Normal respiratory effort. Musculoskeletal.  Able to move left arm and fingers and hand, some difficulty noted.  I have personally reviewed the following:   Today's Data  . No new data  Scheduled Meds: . FLUoxetine  40 mg Oral Daily  . methylphenidate  36 mg Oral q AM  . Norethindrone-Ethinyl Estradiol-Fe Biphas  1 tablet Oral Daily   Continuous Infusions: . methylPREDNISolone (SOLU-MEDROL) injection 1,000 mg (04/13/19 1124)    Principal Problem:   Demyelinating disorder (HCC) Active Problems:   MS (multiple sclerosis) (HCC)   Tobacco abuse   ADHD   LOS: 3 days   How to contact the [facility] Attending or Consulting provider 7A - 7P or covering provider during after hours 7P -7A, for this patient?  1. Check the care team in [facility] and look for a) attending/consulting [facility] provider listed and b) the [facility] team listed 2. Log into [email or web address] and use [facility] universal password to access. If you do not have the password, please contact the hospital operator. 3. Locate the  [facility] provider you are looking for under [facility] and page to a number that you can be directly reached. 4. If you still have difficulty reaching the provider, please page the [facility] (Director on Call) for the Hospitalists listed on amion for assistance.

## 2019-04-13 NOTE — Progress Notes (Signed)
Subjective: Continues to improve  Exam: Vitals:   04/12/19 2331 04/13/19 0506  BP: 117/74 102/78  Pulse: (!) 57 76  Resp:    Temp: 98.1 F (36.7 C) 98.4 F (36.9 C)  SpO2: 95% 98%   Gen: In bed, NAD Resp: non-labored breathing, no acute distress Abd: soft, nt  Neuro: MS: Awake, alert, interactive and appropriate CN: still with Right abducens palsy but improved from previous days, pupils equal round and reactive Motor: She has mild drift in the left arm , and slight give on triceps extension.  Sensory: Diminished in the left foot, intact in the mid calf to light touch  Pertinent Labs: NMO-pending  Imaging reviewed: Extensive T2 change with lesions some of which are enhancing.    Impression: 19 year old female with monophasic clinical illness with multiple lesions on MRI some of which are enhancing and some which are not.  The clinical appearance is somewhat reminiscent of acute disseminated encephalomyelitis(ADEM), but the presence of nonenhancing lesions would argue against this.  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  In any case, the current management is steroids and she appears to be improving with them which is reassuring.  She will need 5 days of IV Solu-Medrol and then be followed up as an outpatient for starting disease modifying therapy.  Recommendations: 1) Last dose of solumedrol today.  2) PT/OT 3) Neurology will be available as needed.   Ritta Slot, MD [facility] [phone]  If 7pm- 7am, please page neurology on call as listed in AMION.

## 2019-04-13 NOTE — Telephone Encounter (Signed)
04/17/19 at 9am also opened and placed on hold. Can offer this appt as well

## 2019-04-13 NOTE — Progress Notes (Signed)
Inpatient Rehab Admissions:  Inpatient Rehab Consult received.  I met with patient and her mother at the bedside for rehabilitation assessment and to discuss goals and expectations of an inpatient rehab admission.  Both are hopeful for CIR admission and family ready to support pt at home after rehab.  Will open insurance for authorization request for possible admission tomorrow/Saturday pending bed availability and approval.   Signed: Shann Medal, PT, DPT Admissions Coordinator [phone] 04/13/19  1:37 PM

## 2019-04-13 NOTE — Progress Notes (Signed)
Physical Therapy Treatment Patient Details Name: Jean Carlson MRN: [id number] DOB: Dec 31, 2000 Today's Date: 04/13/2019    History of Present Illness Pt is a 19 yo s/p vision changes, L side weakness and numbness, recent fall x3 weeks ago. 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 PMHx: ADHD.    PT Comments    PT and OT saw pt together to maximize pt tolerance and safety during mobility. Pt progressing with mobility and self-awareness, but still requires significant cuing for safe mobility and normalizing gait. Pt ambulated hallway distance, requiring standing rest break, with use of RW and frequent cuing for gait parameters and hallway navigation. Pt with significant incoordination of both LUE and LLE, but does well with external cuing to correct. L knee brace not utilized during session, no buckling present but pt does tend to hold LLE in extension during mobility requiring verbal cuing to correct. PT to continue to follow acutely.    Follow Up Recommendations  CIR     Equipment Recommendations  Other (comment)(TBD)    Recommendations for Other Services Rehab consult     Precautions / Restrictions Precautions Precautions: Fall Restrictions Weight Bearing Restrictions: No Other Position/Activity Restrictions: L knee brace not utilized today    Mobility  Bed Mobility Overal bed mobility: Needs Assistance Bed Mobility: Supine to Sit;Sit to Supine     Supine to sit: Supervision;HOB elevated Sit to supine: Supervision;HOB elevated   General bed mobility comments: supervision for lV line and safety, increased time  Transfers Overall transfer level: Needs assistance Equipment used: Rolling walker (2 wheeled) Transfers: Sit to/from Stand Sit to Stand: Min assist         General transfer comment: min assist to steady, verbal cuing for hand placement when rising (not followed); sit to stand x2 from EOB  and toilet.  Ambulation/Gait Ambulation/Gait assistance: Min assist;+2 safety/equipment Gait Distance (Feet): 80 Feet(2x80 ft, standing rest break) Assistive device: Rolling walker (2 wheeled) Gait Pattern/deviations: Step-through pattern;Decreased stride length;Staggering right;Decreased dorsiflexion - left;Decreased weight shift to left Gait velocity: decr   General Gait Details: min assist to steady and guide RW from veering R as is pt prefernce. PT providing external cuing through use of floor tiles to keep RW aligned within. verbal cuing for increased LLE clearance during swing phase by L DF and knee flexion. Pt tends to hold LLE in extension for stability during both swing and stance phase.   Stairs             Wheelchair Mobility    Modified Rankin (Stroke Patients Only)       Balance Overall balance assessment: Needs assistance Sitting-balance support: No upper extremity supported;Feet supported Sitting balance-Leahy Scale: Fair       Standing balance-Leahy Scale: Poor Standing balance comment: min assist in static standing, standing in spinal extension with bilateral LEs locked in extension as stability when standing for ADL tasks with OT                            Cognition Arousal/Alertness: Awake/alert Behavior During Therapy: Impulsive Overall Cognitive Status: Within Functional Limits for tasks assessed                                 General Comments: Pt with increased self-awareness this session, cuing self to align body in RW and adjust posture. Impulsive,  requires cues to slow down      Exercises      General Comments        Pertinent Vitals/Pain Pain Assessment: 0-10 Pain Score: 0-No pain Pain Intervention(s): Limited activity within patient's tolerance;Monitored during session    Home Living                      Prior Function            PT Goals (current goals can now be found in the care plan  section) Acute Rehab PT Goals Patient Stated Goal: to be independent PT Goal Formulation: With patient Time For Goal Achievement: 04/26/19 Potential to Achieve Goals: Good Progress towards PT goals: Progressing toward goals    Frequency    Min 3X/week      PT Plan Current plan remains appropriate    Co-evaluation PT/OT/SLP Co-Evaluation/Treatment: Yes Reason for Co-Treatment: For patient/therapist safety;To address functional/ADL transfers PT goals addressed during session: Mobility/safety with mobility;Balance        AM-PAC PT "6 Clicks" Mobility   Outcome Measure  Help needed turning from your back to your side while in a flat bed without using bedrails?: A Little Help needed moving from lying on your back to sitting on the side of a flat bed without using bedrails?: A Little Help needed moving to and from a bed to a chair (including a wheelchair)?: A Little Help needed standing up from a chair using your arms (e.g., wheelchair or bedside chair)?: A Little Help needed to walk in hospital room?: A Little Help needed climbing 3-5 steps with a railing? : A Lot 6 Click Score: 17    End of Session Equipment Utilized During Treatment: Gait belt Activity Tolerance: Patient tolerated treatment well Patient left: in bed;with call bell/phone within reach;with family/visitor present Nurse Communication: Mobility status PT Visit Diagnosis: Other abnormalities of gait and mobility (R26.89);Muscle weakness (generalized) (M62.81);Other symptoms and signs involving the nervous system (R29.898)     Time: 1204-1229 PT Time Calculation (min) (ACUTE ONLY): 25 min  Charges:  $Gait Training: 8-22 mins                    Wisam Siefring E, PT Acute Rehabilitation Services Pager [phone]  Office [phone]    Destini Cambre D Despina Hidden 04/13/2019, 4:06 PM

## 2019-04-14 ENCOUNTER — Encounter (HOSPITAL_COMMUNITY): Payer: Self-pay | Admitting: Internal Medicine

## 2019-04-14 DIAGNOSIS — H532 Diplopia: Secondary | ICD-10-CM

## 2019-04-14 DIAGNOSIS — R29898 Other symptoms and signs involving the musculoskeletal system: Secondary | ICD-10-CM

## 2019-04-14 MED ORDER — INFLUENZA VAC SPLIT QUAD 0.5 ML IM SUSY
0.5000 mL | PREFILLED_SYRINGE | INTRAMUSCULAR | Status: AC
  Administered 2019-04-17: 09:00:00 0.5 mL via INTRAMUSCULAR
  Filled 2019-04-14: qty 0.5

## 2019-04-14 MED ORDER — CALCIUM CARBONATE ANTACID 500 MG PO CHEW
1.0000 | CHEWABLE_TABLET | ORAL | Status: DC | PRN
  Administered 2019-04-14: 200 mg via ORAL
  Filled 2019-04-14: qty 1

## 2019-04-14 NOTE — Progress Notes (Signed)
PROGRESS NOTE  Jean Carlson YKD:[id number] DOB: 03/31/2000 DOA: 04/09/2019 PCP: Lennie Odor, PA  Brief History   19 year old woman presented with several day history of diplopia, difficulty speaking and swallowing, left-sided weakness.  MRI of the brain and cervical spine showed multiple lesions suggestive of demyelinating disorder.  Admitted for further evaluation, seen by neurology and started on high-dose IV steroids.  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 --Status post 5 doses of high-dose Solu-Medrol.  Discussed with neurology, recommendation for discharge to CIR. --Outpatient referral made to Dr. Richardean Chimera [facility] neurology for consideration of disease modifying therapy.  ADHD --Stable.  Continue Concerta and Prozac  Tobacco use disorder --Continue nicotine patch  Disposition Plan:  From: home Anticipated disposition: CIR Discussion: Patient is medically stable for transfer to CIR.  Discussed with coordinator, no beds are available today.  Patient requires intensive rehabilitation to maximize her functional recovery prior to discharge back home, therefore remain in hospital till CIR bed obtained.  DVT prophylaxis: SCDs Code Status: Full Family Communication: Discussed with grandmother at bedside and mother by telephone in patient's presence    Murray Hodgkins, MD  [facility] Direct contact: see [email or web address] (further directions at bottom of note if needed) 7PM-7AM contact night coverage as at bottom of note 04/14/2019, 2:56 PM  LOS: 4 days   Significant Hospital Events   . 3/14 admitted for further evaluation of suspected demyelinating disorder   Consults:  . Neurology   Procedures:  .   Significant Diagnostic Tests:   . SARS-CoV-2 negative . Chest x-ray no acute disease . CT head no acute disease . MRI brain and cervical spine: Multiple white matter lesions scattered throughout the brain and cervical spinal cord most consistent with demyelinating disease.   Micro Data:  .    Antimicrobials:  .   Interval History/Subjective  Feels somewhat better today, at least her left hand seems to be improving although she still has problems with coordination.  Left foot is still numb.  Diplopia is less prominent today although she feels like she has some difficulty maintaining her head straight.  Objective   Vitals:  Vitals:   04/14/19 0447 04/14/19 1210  BP: 115/78 117/83  Pulse: 62 83  Resp: 15 17  Temp: 98.3 F (36.8 C) 98.6 F (37 C)  SpO2: 99% 97%    Exam:  Constitutional.  Appears calm, comfortable. Cardiovascular.  Regular rate and rhythm.  No murmur, rub or gallop.  No lower extremity edema noted. Respiratory.  Clear to auscultation bilaterally.  No wheezes, rales or rhonchi.  Normal respiratory effort. Psychiatric.  Grossly normal mood and affect.  Speech fluent and appropriate.  I have personally reviewed the following:   Today's Data  . No new data  Scheduled Meds: . FLUoxetine  40 mg Oral Daily  . methylphenidate  36 mg Oral q AM   Continuous Infusions:   Principal Problem:   Demyelinating disorder (HCC) Active Problems:   MS (multiple sclerosis) ([place])   Tobacco abuse   ADHD   LOS: 4 days   How to contact the [facility] Attending or Consulting provider [place] or covering provider during after hours [place], for this patient?  1. Check the care team in [facility] and look for a) attending/consulting [place] provider listed and b)  the  Hospital team listed 2. Log into [email or web address] and use [place]'s universal password to access. If you do not have the password, please contact the hospital operator. 3. Locate the [facility] provider you are looking for under [facility] and page to a number  that you can be directly reached. 4. If you still have difficulty reaching the provider, please page the [facility] (Director on Call) for the Hospitalists listed on amion for assistance.

## 2019-04-14 NOTE — Progress Notes (Signed)
Inpatient Rehab Admissions Coordinator Note:   Awaiting insurance authorization determination. I do not have a bed available for this patient at this time. Will follow up next week pending bed availability.   Hollan Philipp, MS, CCC-SLP Rehab Admissions Coordinator   

## 2019-04-14 NOTE — H&P (Signed)
Physical Medicine and Rehabilitation Admission H&P    Chief Complaint  Patient presents with  . Weakness  . Fall  : HPI: Jean Carlson. Fielder is a 19 year old right-handed female with history of ADHD maintained on Concerta and tobacco abuse.  Per chart review lives with her grandmother.  1 level home.  Presented 04/09/2019 with double vision and numbness as well as weakness to the left side that progressed over the last 3 weeks.  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  Multiple lesions within the brainstem.  No hemorrhage midline shift or hydrocephalus.  No cervical spinal canal stenosis.  Admission chemistries unremarkable.  Patient completed a 5-day course of Solu-Medrol.  Therapy evaluations completed and patient was admitted for a comprehensive rehab program  Review of Systems  Constitutional: Negative for chills and fever.  HENT: Negative for hearing loss.   Eyes: Positive for blurred vision and double vision.  Respiratory: Negative for cough and shortness of breath.   Cardiovascular: Positive for leg swelling.  Gastrointestinal: Positive for constipation. Negative for heartburn, nausea and vomiting.  Genitourinary: Negative for dysuria, flank pain and hematuria.  Musculoskeletal: Positive for joint pain and myalgias.  Skin: Negative for rash.  Neurological: Positive for tremors, sensory change, focal weakness and weakness.  All other systems reviewed and are negative.  Past Medical History:  Diagnosis Date  . ADHD    History reviewed. No pertinent surgical history. Family History  Problem Relation Age of Onset  . Hyperlipidemia Maternal Grandmother   . Hypertension Maternal Grandmother   . Diabetes Paternal Grandfather   . Hypertension Paternal Grandfather   . Heart failure Paternal Grandfather    Social History:   reports that she has been smoking. She has never used smokeless tobacco. She reports previous alcohol use. She reports that she does not use drugs. Allergies:  Allergies  Allergen Reactions  . Amoxicillin Other (See Comments)    CAUSED BAD NIGHTMARES  . Codeine Nausea Only    SEVERE NAUSEA  . Vyvanse [Lisdexamfetamine] Other (See Comments)    "Caused depression to the point of self-harm"   Medications Prior to Admission  Medication Sig Dispense Refill  . FLUoxetine (PROZAC) 40 MG capsule Take 40 mg by mouth in the morning.    [name] ibuprofen (ADVIL) 200 MG tablet Take 600-800 mg by mouth every 6 (six) hours as needed for headache or mild pain.    . LO LOESTRIN FE 1 MG-10 MCG / 10 MCG tablet Take 1 tablet by mouth daily.     . methylphenidate 36 MG PO CR tablet Take 36 mg by mouth in the morning.    . meloxicam (MOBIC) 7.5 MG tablet Take 1 tablet (7.5 mg total) by mouth daily. (Patient not taking: Reported on 04/09/2019) 30 tablet 0    Drug Regimen Review Drug regimen was reviewed and remains appropriate with no significant issues identified  Home: Home Living Family/patient expects to be discharged to:: Private residence Living Arrangements: Parent Available Help at Discharge: Family, Available 24 hours/day(parents taking short term time off, up to 1 month) Type of Home: House Home Access: Stairs to enter Home Layout: One level Bathroom Shower/Tub: Hydrographic surveyor, Engineer, building services: Standard Bathroom Accessibility: No Home Equipment: Information systems manager, Grab bars - toilet, Grab bars - tub/shower, Bedside commode, Walker - 4 wheels, Walker - 2 wheels, Transport chair Additional Comments: family is working on a  ramp to entrance of front door   Functional History: Prior Function Level of Independence: Needs assistance Gait / Transfers Assistance Needed: KI at all times for 3 weeks, per pt is waiting for ortho appointment ADL's / Homemaking Assistance Needed: Independent with most  ADL; before fall 3 weeks ago was completely independent Comments: Recent fall x3 weeks ago; KI issued by orthopedic MD. The last week pt vision, L side weakness deficits, intermittent slurred speech. Pt enjoys playing softball, does not work currently  Functional Status:  Mobility: Bed Mobility Overal bed mobility: Needs Assistance Bed Mobility: Supine to Sit, Sit to Supine Supine to sit: Supervision, HOB elevated Sit to supine: Supervision, HOB elevated General bed mobility comments: supervision for lV line and safety, increased time Transfers Overall transfer level: Needs assistance Equipment used: Rolling walker (2 wheeled) Transfers: Sit to/from Stand Sit to Stand: Min assist General transfer comment: min assist to steady, verbal cuing for hand placement when rising (not followed); sit to stand x2 from EOB and toilet. Ambulation/Gait Ambulation/Gait assistance: Min assist, +2 safety/equipment Gait Distance (Feet): 80 Feet(2x80 ft, standing rest break) Assistive device: Rolling walker (2 wheeled) Gait Pattern/deviations: Step-through pattern, Decreased stride length, Staggering right, Decreased dorsiflexion - left, Decreased weight shift to left General Gait Details: min assist to steady and guide RW from veering R as is pt prefernce. PT providing external cuing through use of floor tiles to keep RW aligned within. verbal cuing for increased LLE clearance during swing phase by L DF and knee flexion. Pt tends to hold LLE in extension for stability during both swing and stance phase. Gait velocity: decr    ADL: ADL Overall ADL's : Needs assistance/impaired Eating/Feeding: Modified independent, Sitting Grooming: Minimal assistance, Standing, Wash/dry hands, Oral care Grooming Details (indicate cue type and reason): min assist for standing balance, stood x 3 minutes Upper Body Bathing: Set up, Sitting Lower Body Bathing: Minimal assistance, Sitting/lateral leans, Sit to/from stand,  Cueing for safety Upper Body Dressing : Minimal assistance, Sitting Upper Body Dressing Details (indicate cue type and reason): due to IV line Lower Body Dressing: Minimal assistance, Sitting/lateral leans, Sit to/from stand Toilet Transfer: Minimal assistance, Ambulation, RW, Regular Toilet, Grab bars Toilet Transfer Details (indicate cue type and reason): minA to avoid plopping; pt is very impulsive Toileting- Clothing Manipulation and Hygiene: Minimal assistance, Sit to/from stand Toileting - Clothing Manipulation Details (indicate cue type and reason): min assist for balance Functional mobility during ADLs: Minimal assistance, Rolling walker, Cueing for safety General ADL Comments: Pt limited by inpulsivity, decreased strength, decreased activity tolerance, decreased ability to care for self safely.   Cognition: Cognition Overall Cognitive Status: Within Functional Limits for tasks assessed Orientation Level: Oriented X4 Cognition Arousal/Alertness: Awake/alert Behavior During Therapy: Impulsive Overall Cognitive Status: Within Functional Limits for tasks assessed General Comments: Pt with increased self-awareness this session, cuing self to align body in RW and adjust posture. Impulsive, requires cues to slow down  Physical Exam: Blood pressure 96/62, pulse 82, temperature 98.4 F (36.9 C), temperature source Oral, resp. rate 16, height 5\' 4"  (1.626 m), weight 84.8 kg, last menstrual period 03/17/2019, SpO2 100 %. Physical Exam  Constitutional: She is oriented to person, place, and time. She appears well-developed. She appears distressed.  HENT:  Head: Normocephalic and atraumatic.  Eyes: Pupils are equal, round, and reactive to light. Conjunctivae are normal. Right eye exhibits no discharge. Left eye exhibits no discharge.  Neck: No thyromegaly present.  Cardiovascular: Normal rate and regular rhythm. Exam reveals no  friction rub.  No murmur heard. Respiratory: Effort normal. No  respiratory distress. She has no wheezes.  GI: Soft. She exhibits no distension. There is no abdominal tenderness.  Musculoskeletal:     Cervical back: Normal range of motion.     Comments: No obvious pain left knee with AROM/PROM. No joint effusion  Neurological: She is alert and oriented to person, place, and time.  Tracks to all fields, gaze slightly dysconjugate when looking right, +diplopia with rightward gaze. Mild left PD. Strength 5/5 bilateral UE. RLE 5/5. LLE 4+/5. Decreased LT LLE, foot more affected than lower leg. Decreased HTS LLE. Reasonable insight and awareness but distractible, tangential.   Skin: Skin is warm and dry.  Psychiatric: She has a normal mood and affect.    No results found for this or any previous visit (from the past 48 hour(s)). No results found.     Medical Problem List and Plan: 1.  Decreased functional mobility secondary to demyelinating disorder/multiple sclerosis.  IV Solu-Medrol completed.  Patient to follow-up Dr. Epimenio Foot at the outpatient neurologic center  -patient may   shower  -ELOS/Goals: 8-10 days, supervision to mod I 2.  Antithrombotics: -DVT/anticoagulation: SCDs  -antiplatelet therapy: N/A 3. Pain Management: Tylenol as needed 4. Mood: Prozac 40 mg daily  -antipsychotic agents: N/A 5. Neuropsych: This patient is capable of making decisions on her own behalf. 6. Skin/Wound Care: Routine skin checks 7. Fluids/Electrolytes/Nutrition: Routine in and outs with follow-up chemistries 8.  ADHD.  Continue Concerta 30 mg daily 9.  Tobacco abuse.  Counseling 10. Hx of left pain after fall. Pt s/p MRI ordered by ortho on 04/07/19 which revealed bone contusion along the anterior aspect of lateral tibial plateau. 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 No ligament or meniscal tears.   -conservative care, pain relief, fall prevention/gait mechanics with PT  -pt reports hx of left knee hyperextension (likely related to #1) during gait  which predisposed to her pain/falls.  -pt has knee brace at home given to her by ortho. Asked her to have it brought in.      Mcarthur Rossetti Angiulli, PA-C 04/17/2019

## 2019-04-15 MED ORDER — POLYETHYLENE GLYCOL 3350 17 G PO PACK
17.0000 g | PACK | Freq: Two times a day (BID) | ORAL | Status: DC
  Administered 2019-04-15 – 2019-04-17 (×5): 17 g via ORAL
  Filled 2019-04-15 (×5): qty 1

## 2019-04-15 NOTE — Progress Notes (Signed)
PROGRESS NOTE  Jean Carlson [id number]:[id number] DOB: 2000/10/05 DOA: 04/09/2019 PCP: Milus Height, PA  Brief History   19 year old woman presented with several day history of diplopia, difficulty speaking and swallowing, left-sided weakness.  MRI of the brain and cervical spine showed multiple lesions suggestive of demyelinating disorder.  Admitted for further evaluation, seen by neurology and started on high-dose IV steroids.  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 --Status post 5 doses of Solu-Medrol.  Plan is for transfer to CIR.  Patient remains medically stable.   --Outpatient referral made to Dr. Olive Bass [facility] neurology for consideration of disease modifying therapy.  ADHD --Remains stable.  Continue Concerta and Prozac  Tobacco use disorder  Disposition Plan:  From: home Anticipated disposition: CIR Discussion: Remains medically stable for transfer to CIR when bed available.  DVT prophylaxis: SCDs Code Status: Full Family Communication: Discussed with grandmother at bedside and mother by telephone in patient's presence    Jean Sacks, MD  [facility] Direct contact: see [email or web address] (further directions at bottom of note if needed) 7PM-7AM contact night coverage as at bottom of note 04/15/2019, 2:25 PM  LOS: 5 days   Significant Hospital Events   . 3/14 admitted for further evaluation of suspected demyelinating disorder   Consults:  . Neurology   Procedures:  .   Significant Diagnostic Tests:  . SARS-CoV-2 negative . Chest x-ray no acute disease . CT head no acute disease . MRI brain and cervical spine: Multiple white matter lesions scattered throughout the brain and cervical spinal cord most consistent with  demyelinating disease.   Micro Data:  .    Antimicrobials:  .   Interval History/Subjective  Head still unsteady but double vision continues to improve, now only has double vision when looking to the right.  Still has some discoordination of her left hand.  Left foot still somewhat numb.  Cannot totally feel the floor when she tries to walk.  Objective   Vitals:  Vitals:   04/15/19 0902 04/15/19 1159  BP: 123/80 122/68  Pulse: 96 (!) 105  Resp:  18  Temp:  98 F (36.7 C)  SpO2:  98%    Exam:  Constitutional.  Appears calm, comfortable. Cardiovascular.  Regular rate and rhythm.  No murmur, rub or gallop.  No lower extremity edema. Respiratory.  Clear to auscultation bilaterally.  No wheezes, rales or rhonchi.  Normal respiratory effort. Musculoskeletal.  Strength left hand appears intact. Neurologic exam does not appear to be changed on limited exam.  I have personally reviewed the following:   Today's Data  . No new data  Scheduled Meds: . FLUoxetine  40 mg Oral Daily  . influenza vac split quadrivalent PF  0.5 mL Intramuscular Tomorrow-1000  . methylphenidate  36 mg Oral q AM  . polyethylene glycol  17 g Oral BID   Continuous Infusions:   Principal Problem:   Demyelinating disorder (HCC) Active Problems:   MS (multiple sclerosis) (HCC)   Tobacco abuse   ADHD   LOS: 5 days   How to contact the [facility] Attending or Consulting provider 7A - 7P or covering provider during after hours 7P -7A, for this patient?  1. Check the care team in [facility] and look for a) attending/consulting [facility] provider listed and b) the [facility] team  listed 2. Log into [email or web address] and use [place]'s universal password to access. If you do not have the password, please contact the hospital operator. 3. Locate the [facility] provider you are looking for under [facility] and page to a number that you can be directly reached. 4. If you still have difficulty reaching the provider, please page the  [facility] (Director on Call) for the Hospitalists listed on amion for assistance.

## 2019-04-16 MED ORDER — BISACODYL 5 MG PO TBEC
5.0000 mg | DELAYED_RELEASE_TABLET | Freq: Once | ORAL | Status: AC
  Administered 2019-04-16: 5 mg via ORAL
  Filled 2019-04-16: qty 1

## 2019-04-16 NOTE — Progress Notes (Signed)
PROGRESS NOTE  Jean Carlson VZC:[id number] DOB: [birth year] DOA: 04/09/2019 PCP: Milus Height, PA  Brief History   19 year old woman presented with several day history of diplopia, difficulty speaking and swallowing, left-sided weakness.  MRI of the brain and cervical spine showed multiple lesions suggestive of demyelinating disorder.  Admitted for further evaluation, seen by neurology and started on high-dose IV steroids.  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 --Status post 5 doses of Solu-Medrol.  Plan is for transfer to CIR.  Patient remains medically stable.   --Outpatient referral made to Dr. [facility] neurology for consideration of disease modifying therapy.  ADHD --Remains stable.  Continue Concerta and Prozac  Tobacco use disorder  Disposition Plan:  From: home Anticipated disposition: CIR Discussion: Remains medically stable for transfer to CIR when bed available.  DVT prophylaxis: SCDs Code Status: Full Family Communication: None present    [name], MD  [facility] Direct contact: see [email or web address] (further directions at bottom of note if needed) 7PM-7AM contact night coverage as at bottom of note 04/16/2019, 3:19 PM  LOS: 6 days   Significant Hospital Events   . 3/14 admitted for further evaluation of suspected demyelinating disorder   Consults:  . Neurology   Procedures:  .   Significant Diagnostic Tests:  . SARS-CoV-2 negative . Chest x-ray no acute disease . CT head no acute disease . MRI brain and cervical spine: Multiple white matter lesions scattered throughout the brain and cervical spinal cord most consistent with demyelinating disease.   Micro Data:  .    Antimicrobials:  .   Interval  History/Subjective  Head bobble seems to be a little bit worse.  Some discoordination of her arm her arm has developed.  Strength in left hand has improved.  Left foot is less numb.  Objective   Vitals:  Vitals:   04/16/19 0913 04/16/19 1234  BP: (!) 96/58 119/76  Pulse: 98 (!) 108  Resp:  18  Temp:  97.8 F (36.6 C)  SpO2:  100%    Exam:  Constitutional.  Appears calm, comfortable. Respiratory.  Clear to auscultation bilaterally.  No wheezes, rales or rhonchi.  Normal respiratory effort. Cardiovascular.  Regular rate and rhythm.  No murmur, rub or gallop. No left lower extremity edema. Psychiatric.  Grossly normal mood and affect.  Speech fluent and appropriate. Musculoskeletal.  Discoordination of the left hand is noted.  I have personally reviewed the following:   Today's Data  . No new data  Scheduled Meds: . FLUoxetine  40 mg Oral Daily  . influenza vac split quadrivalent PF  0.5 mL Intramuscular Tomorrow-1000  . methylphenidate  36 mg Oral q AM  . polyethylene glycol  17 g Oral BID   Continuous Infusions:   Principal Problem:   Demyelinating disorder (HCC) Active Problems:   MS (multiple sclerosis) (HCC)   Tobacco abuse   ADHD   LOS: 6 days   How to contact the [facility] Attending or Consulting provider 7A - 7P or covering provider during after hours 7P -7A, for this patient?  1. Check the care team in [facility] and look for a) attending/consulting TRH provider listed and b) the [facility] team listed 2. Log into [email or web address] and use [facility]'s universal password to access. If you do not have the  password, please contact the hospital operator. 3. Locate the [facility] provider you are looking for under [facility] and page to a number that you can be directly reached. 4. If you still have difficulty reaching the provider, please page the [facility] (Director on Call) for the Hospitalists listed on amion for assistance.

## 2019-04-17 ENCOUNTER — Inpatient Hospital Stay (HOSPITAL_COMMUNITY): Payer: BC Managed Care – PPO

## 2019-04-17 ENCOUNTER — Inpatient Hospital Stay (HOSPITAL_COMMUNITY): Payer: BC Managed Care – PPO | Admitting: Occupational Therapy

## 2019-04-17 ENCOUNTER — Ambulatory Visit: Payer: Managed Care, Other (non HMO) | Admitting: Neurology

## 2019-04-17 ENCOUNTER — Other Ambulatory Visit: Payer: Self-pay

## 2019-04-17 ENCOUNTER — Inpatient Hospital Stay (HOSPITAL_COMMUNITY)
Admission: RE | Admit: 2019-04-17 | Discharge: 2019-04-24 | DRG: 059 | Disposition: A | Discharge status: Home Health | Payer: BC Managed Care – PPO | Source: Intra-hospital | Attending: Physical Medicine and Rehabilitation | Admitting: Physical Medicine and Rehabilitation

## 2019-04-17 ENCOUNTER — Encounter (HOSPITAL_COMMUNITY): Payer: Self-pay | Admitting: Physical Medicine and Rehabilitation

## 2019-04-17 DIAGNOSIS — W19XXXD Unspecified fall, subsequent encounter: Secondary | ICD-10-CM | POA: Diagnosis present

## 2019-04-17 DIAGNOSIS — Z8349 Family history of other endocrine, nutritional and metabolic diseases: Secondary | ICD-10-CM

## 2019-04-17 DIAGNOSIS — Z833 Family history of diabetes mellitus: Secondary | ICD-10-CM | POA: Diagnosis not present

## 2019-04-17 DIAGNOSIS — Z79899 Other long term (current) drug therapy: Secondary | ICD-10-CM

## 2019-04-17 DIAGNOSIS — F17200 Nicotine dependence, unspecified, uncomplicated: Secondary | ICD-10-CM | POA: Diagnosis not present

## 2019-04-17 DIAGNOSIS — K592 Neurogenic bowel, not elsewhere classified: Secondary | ICD-10-CM

## 2019-04-17 DIAGNOSIS — N319 Neuromuscular dysfunction of bladder, unspecified: Secondary | ICD-10-CM | POA: Diagnosis not present

## 2019-04-17 DIAGNOSIS — E876 Hypokalemia: Secondary | ICD-10-CM | POA: Diagnosis not present

## 2019-04-17 DIAGNOSIS — Z8249 Family history of ischemic heart disease and other diseases of the circulatory system: Secondary | ICD-10-CM

## 2019-04-17 DIAGNOSIS — Z881 Allergy status to other antibiotic agents status: Secondary | ICD-10-CM | POA: Diagnosis not present

## 2019-04-17 DIAGNOSIS — E875 Hyperkalemia: Secondary | ICD-10-CM | POA: Diagnosis present

## 2019-04-17 DIAGNOSIS — Z6831 Body mass index (BMI) 31.0-31.9, adult: Secondary | ICD-10-CM | POA: Diagnosis not present

## 2019-04-17 DIAGNOSIS — G35 Multiple sclerosis: Secondary | ICD-10-CM | POA: Diagnosis not present

## 2019-04-17 DIAGNOSIS — F909 Attention-deficit hyperactivity disorder, unspecified type: Secondary | ICD-10-CM | POA: Diagnosis present

## 2019-04-17 DIAGNOSIS — S8012XD Contusion of left lower leg, subsequent encounter: Secondary | ICD-10-CM | POA: Diagnosis not present

## 2019-04-17 DIAGNOSIS — Z885 Allergy status to narcotic agent status: Secondary | ICD-10-CM | POA: Diagnosis not present

## 2019-04-17 DIAGNOSIS — G379 Demyelinating disease of central nervous system, unspecified: Secondary | ICD-10-CM | POA: Diagnosis not present

## 2019-04-17 DIAGNOSIS — E6609 Other obesity due to excess calories: Secondary | ICD-10-CM

## 2019-04-17 DIAGNOSIS — S8012XA Contusion of left lower leg, initial encounter: Secondary | ICD-10-CM

## 2019-04-17 DIAGNOSIS — K5900 Constipation, unspecified: Secondary | ICD-10-CM | POA: Diagnosis not present

## 2019-04-17 DIAGNOSIS — N3941 Urge incontinence: Secondary | ICD-10-CM | POA: Diagnosis present

## 2019-04-17 MED ORDER — IBUPROFEN 400 MG PO TABS: 600.0000 mg | ORAL_TABLET | Freq: Four times a day (QID) | ORAL | Status: DC | PRN

## 2019-04-17 MED ORDER — CALCIUM CARBONATE ANTACID 500 MG PO CHEW: 1.0000 | CHEWABLE_TABLET | ORAL | Status: DC | PRN

## 2019-04-17 MED ORDER — BISACODYL 5 MG PO TBEC
10.0000 mg | DELAYED_RELEASE_TABLET | Freq: Every day | ORAL | Status: DC
  Administered 2019-04-17 – 2019-04-23 (×6): 10 mg via ORAL
  Filled 2019-04-17 (×6): qty 2

## 2019-04-17 MED ORDER — ACETAMINOPHEN 650 MG RE SUPP: 650.0000 mg | Freq: Four times a day (QID) | RECTAL | Status: DC | PRN

## 2019-04-17 MED ORDER — ACETAMINOPHEN 325 MG PO TABS: 650.0000 mg | ORAL_TABLET | Freq: Four times a day (QID) | ORAL | Status: DC | PRN

## 2019-04-17 MED ORDER — METHYLPHENIDATE HCL ER (OSM) 18 MG PO TBCR
36.0000 mg | EXTENDED_RELEASE_TABLET | Freq: Every morning | ORAL | Status: DC
  Administered 2019-04-18 – 2019-04-20 (×3): 36 mg via ORAL
  Filled 2019-04-17 (×4): qty 2

## 2019-04-17 MED ORDER — FLUOXETINE HCL 20 MG PO CAPS
40.0000 mg | ORAL_CAPSULE | Freq: Every day | ORAL | Status: DC
  Administered 2019-04-18 – 2019-04-24 (×7): 40 mg via ORAL
  Filled 2019-04-17 (×7): qty 2

## 2019-04-17 MED ORDER — SORBITOL 70 % SOLN
30.0000 mL | Freq: Every day | Status: DC | PRN
  Administered 2019-04-19: 07:00:00 30 mL via ORAL
  Filled 2019-04-17: qty 30

## 2019-04-17 MED ORDER — POLYETHYLENE GLYCOL 3350 17 G PO PACK: 17.0000 g | PACK | Freq: Two times a day (BID) | ORAL | Status: DC

## 2019-04-17 NOTE — PMR Pre-admission (Signed)
PMR Admission Coordinator Pre-Admission Assessment  Patient: Jean Carlson is an 19 y.o., female MRN: [id number] DOB: [birth year] Height: 5' 4"  (162.6 cm) Weight: 84.8 kg              Insurance Information HMO:     PPO: yes     PCP:      IPA:      80/20:      OTHER:  PRIMARY: [place]      Policy#: [id number]      Subscriber: pt CM Name: Jean Carlson Phone#: [phone]     Fax#: [phone] Pre-Cert#: [id number] auth for CIR provided by Jean Carlson with [place], for admit 3/22 with updates due to fax listed above on 3/30.       Employer: n/a Benefits:  Phone #: [phone]     Name:  Eff. Date: 05/30/2017     Deduct: $200 ($74.39 met)      Out of Pocket Max: $1000 ($0 met)      Life Max: n/a CIR: 80%      SNF: 80% Outpatient: 80%     Co-Pay: 20% Home Health: 80%      Co-Pay: 20% DME: 80%     Co-Pay: 20% Providers: preferred network SECONDARY:       Policy#:       Subscriber:  CM Name:       Phone#:      Fax#:  Pre-Cert#:       Employer:  Benefits:  Phone #:      Name:  Eff. Date:      Deduct:       Out of Pocket Max:       Life Max:  CIR:       SNF:  Outpatient:      Co-Pay:  Home Health:       Co-Pay:  DME:      Co-Pay:   Medicaid Application Date:       Case Manager:  Disability Application Date:       Case Worker:   The "Data Collection Information Summary" for patients in Inpatient Rehabilitation Facilities with attached "[place] Records" was provided and verbally reviewed with: N/A  Emergency Contact Information Contact Information    Name Relation Home Work Mobile   Jean Carlson,Jean Carlson Father [phone]     Jean Carlson,Jean Carlson Mother [phone]       Current Medical History  Patient Admitting Diagnosis: demyelinating disorder, differential dx new MS  History of Present Illness: Jean Carlson is a 19 year old right-handed female with history of ADHD, maintained on Concerta, and tobacco abuse.  Presented 04/09/2019 with double vision and numbness as well as  weakness to the left side that progressed over the last 3 weeks.  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  Multiple lesions within the brainstem.  No hemorrhage midline shift or hydrocephalus.  No cervical spinal canal stenosis.  Admission chemistries unremarkable.  Patient completed a 5-day course of Solu-Medrol on 3/19.  Therapy evaluations completed and patient was recommended for a comprehensive rehab program.   Past Medical History  Past Medical History:  Diagnosis Date  . ADHD     Family History  family history includes Diabetes in her paternal grandfather; Heart failure in her paternal grandfather; Hyperlipidemia in her maternal grandmother; Hypertension in her maternal grandmother and paternal grandfather.  Prior Rehab/Hospitalizations:  Has the patient had prior rehab  or hospitalizations prior to admission? No  Has the patient had major surgery during 100 days prior to admission? No  Current Medications   Current Facility-Administered Medications:  .  acetaminophen (TYLENOL) tablet 650 mg, 650 mg, Oral, Q6H PRN **OR** acetaminophen (TYLENOL) suppository 650 mg, 650 mg, Rectal, Q6H PRN, Jonelle Sidle, Mohammad L, MD .  calcium carbonate (TUMS - dosed in mg elemental calcium) chewable tablet 200 mg of elemental calcium, 1 tablet, Oral, Q4H PRN, Samuella Cota, MD, 200 mg of elemental calcium at 04/14/19 1022 .  FLUoxetine (PROZAC) capsule 40 mg, 40 mg, Oral, Daily, Gala Romney L, MD, 40 mg at 04/17/19 0856 .  ibuprofen (ADVIL) tablet 600-800 mg, 600-800 mg, Oral, Q6H PRN, Jonelle Sidle, Mohammad L, MD .  methylphenidate (CONCERTA) CR tablet 36 mg, 36 mg, Oral, q AM, Jonelle Sidle, Mohammad L, MD, 36 mg at 04/17/19 0855 .  polyethylene glycol (MIRALAX / GLYCOLAX) packet 17 g, 17 g, Oral, BID, Samuella Cota, MD, 17 g at 04/17/19 3428  Patients  Current Diet:  Diet Order            Diet regular Room service appropriate? Yes; Fluid consistency: Thin  Diet effective now              Precautions / Restrictions Precautions Precautions: Fall Restrictions Weight Bearing Restrictions: No Other Position/Activity Restrictions: L knee brace not utilized today   Has the patient had 2 or more falls or a fall with injury in the past year?Yes, fell about 3 weeks PTA with L knee injury. F/u with [facility].   Prior Activity Level Limited Community (1-2x/wk): had been going out less 2/2 weakness, wasn't driving 2/2 visual deficits, no DME used at baseline  Prior Functional Level Prior Function Level of Independence: Needs assistance Gait / Transfers Assistance Needed: KI at all times for 3 weeks, per pt is waiting for ortho appointment ADL's / Homemaking Assistance Needed: Independent with most ADL; before fall 3 weeks ago was completely independent Comments: Recent fall x3 weeks ago; [address] issued by orthopedic MD. The last week pt vision, L side weakness deficits, intermittent slurred speech. Pt enjoys playing softball, does not work currently  Self Care: Did the patient need help bathing, dressing, using the toilet or eating?  Independent  Indoor Mobility: Did the patient need assistance with walking from room to room (with or without device)? Independent  Stairs: Did the patient need assistance with internal or external stairs (with or without device)? Independent  Functional Cognition: Did the patient need help planning regular tasks such as shopping or remembering to take medications? Independent  Home Equities trader / Equipment Home Assistive Devices/Equipment: None Home Equipment: Shower seat, Grab bars - toilet, Grab bars - tub/shower, Bedside commode, Walker - 4 wheels, Walker - 2 wheels, Transport chair  Prior Device Use: Indicate devices/aids used by the patient prior to current illness, exacerbation or injury? None  of the above  Current Functional Level Cognition  Overall Cognitive Status: Within Functional Limits for tasks assessed Orientation Level: Oriented X4 General Comments: Pt with increased self-awareness this session, cuing self to align body in RW and adjust posture. Impulsive, requires cues to slow down    Extremity Assessment (includes Sensation/Coordination)  Upper Extremity Assessment: LUE deficits/detail LUE Deficits / Details: functional weakness noted, incoordinated LUE Sensation: decreased light touch LUE Coordination: decreased gross motor, decreased fine motor  Lower Extremity Assessment: LLE deficits/detail LLE Deficits / Details: MMT: 3/5 - knee extension, knee flexion, DF/PF. 2+/5 - hip flexion.  VERY incoordinated with impaired ability to grade motor response LLE Sensation: decreased light touch LLE Coordination: decreased fine motor, decreased gross motor    ADLs  Overall ADL's : Needs assistance/impaired Eating/Feeding: Modified independent, Sitting Grooming: Minimal assistance, Standing, Wash/dry hands, Oral care Grooming Details (indicate cue type and reason): min assist for standing balance, stood x 3 minutes Upper Body Bathing: Set up, Sitting Lower Body Bathing: Minimal assistance, Sitting/lateral leans, Sit to/from stand, Cueing for safety Upper Body Dressing : Minimal assistance, Sitting Upper Body Dressing Details (indicate cue type and reason): due to IV line Lower Body Dressing: Minimal assistance, Sitting/lateral leans, Sit to/from stand Toilet Transfer: Minimal assistance, Ambulation, RW, Regular Toilet, Grab bars Toilet Transfer Details (indicate cue type and reason): minA to avoid plopping; pt is very impulsive Toileting- Clothing Manipulation and Hygiene: Minimal assistance, Sit to/from stand Toileting - Clothing Manipulation Details (indicate cue type and reason): min assist for balance Functional mobility during ADLs: Minimal assistance, Rolling walker,  Cueing for safety General ADL Comments: Pt limited by inpulsivity, decreased strength, decreased activity tolerance, decreased ability to care for self safely.     Mobility  Overal bed mobility: Needs Assistance Bed Mobility: Supine to Sit, Sit to Supine Supine to sit: Supervision, HOB elevated Sit to supine: Supervision, HOB elevated General bed mobility comments: supervision for lV line and safety, increased time    Transfers  Overall transfer level: Needs assistance Equipment used: Rolling walker (2 wheeled) Transfers: Sit to/from Stand Sit to Stand: Min assist General transfer comment: min assist to steady, verbal cuing for hand placement when rising (not followed); sit to stand x2 from EOB and toilet.    Ambulation / Gait / Stairs / Wheelchair Mobility  Ambulation/Gait Ambulation/Gait assistance: Min assist, +2 safety/equipment Gait Distance (Feet): 80 Feet(2x80 ft, standing rest break) Assistive device: Rolling walker (2 wheeled) Gait Pattern/deviations: Step-through pattern, Decreased stride length, Staggering right, Decreased dorsiflexion - left, Decreased weight shift to left General Gait Details: min assist to steady and guide RW from veering R as is pt prefernce. PT providing external cuing through use of floor tiles to keep RW aligned within. verbal cuing for increased LLE clearance during swing phase by L DF and knee flexion. Pt tends to hold LLE in extension for stability during both swing and stance phase. Gait velocity: decr    Posture / Balance Balance Overall balance assessment: Needs assistance Sitting-balance support: No upper extremity supported, Feet supported Sitting balance-Leahy Scale: Fair Standing balance-Leahy Scale: Poor Standing balance comment: min assist in static standing, standing in spinal extension with bilateral LEs locked in extension as stability when standing for ADL tasks with OT    Special needs/care consideration BiPAP/CPAP no CPM  no Continuous Drip IV no Dialysis no        Days n/a Life Vest no Oxygen no Special Bed no Trach Size no Wound Vac (area) no      Location n/a Skin intact                            Bowel mgmt: continent Bladder mgmt: continent Diabetic mgmt no Behavioral consideration no Chemo/radiation no     Previous Home Environment (from acute therapy documentation) Living Arrangements: Parent Available Help at Discharge: Family, Available 24 hours/day(parents taking short term time off, up to 1 month) Type of Home: House Home Layout: One level Home Access: Stairs to enter [employer] Shower/Tub: Tub/shower unit, Architectural technologist: Standard Bathroom Accessibility: No Home  Care Services: No Additional Comments: family is working on a ramp to entrance of front door  Discharge Living Setting Plans for Discharge Living Setting: Patient's home Type of Home at Discharge: House Discharge Home Layout: One level Discharge Home Access: Stairs to enter, Ramped entrance(plan to build ramp on front if needed) Entrance Stairs-Rails: None Entrance Stairs-Number of Steps: 3 Discharge Bathroom Shower/Tub: Tub/shower unit Discharge Bathroom Toilet: Standard Discharge Bathroom Accessibility: No Does the patient have any problems obtaining your medications?: No  Social/Family/Support Systems Anticipated Caregiver: family, coordinated by mother Jean Carlson) Anticipated Caregiver's Contact Information: Jean Carlson [phone] (home) Ability/Limitations of Caregiver: supervision only Caregiver Availability: 24/7 Discharge Plan Discussed with Primary Caregiver: Yes Is Caregiver In Agreement with Plan?: Yes Does Caregiver/Family have Issues with Lodging/Transportation while Pt is in Rehab?: No   Goals/Additional Needs Patient/Family Goal for Rehab: PT/OT supervision to mod I Expected length of stay: 8-10 days Dietary Needs: regular/thin Equipment Needs: knee brace for L knee, needs f/u with [facility] Pt/Family Agrees to Admission and willing to participate: Yes Program Orientation Provided & Reviewed with Pt/Caregiver Including Roles  & Responsibilities: Yes   Decrease burden of Care through IP rehab admission: n/a   Possible need for SNF placement upon discharge: Not anticipated. Pt with good family support and mod I/supervision goals.   Patient Condition: This patient's medical and functional status has changed since the consult dated: 04/12/19 in which the Rehabilitation Physician determined and documented that the patient's condition is appropriate for intensive rehabilitative care in an inpatient rehabilitation facility. See "History of Present Illness" (above) for medical update. Functional changes are: min assist. Patient's medical and functional status update has been discussed with the Rehabilitation physician and patient remains appropriate for inpatient rehabilitation. Will admit to inpatient rehab today.  Preadmission Screen Completed By:  Michel Santee, PT,  DPT 04/17/2019 9:44 AM ______________________________________________________________________   Discussed status with Dr. Naaman Plummer on 04/17/19 at 9:58 AM  and received approval for admission today.  Admission Coordinator:  Michel Santee, PT, DPT time 9:58 AM Sudie Grumbling 04/17/19

## 2019-04-17 NOTE — Progress Notes (Signed)
Occupational Therapy Treatment Patient Details Name: Jean Carlson MRN: [id number] DOB: Apr 15, 2000 Today's Date: 04/17/2019    History of present illness Pt is a 19 yo s/p vision changes, L side weakness and numbness, recent fall x3 weeks ago. 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 PMHx: ADHD.   OT comments  Pt seen for functional mobility during ADL and to further assess vision. Pt complains of double visioin when "looking ou of the corner, especially upwards". Partial occlusion does not help. Pt complains to "seeing double", even when 1 eye is occluded. Continue to recommend intensive rehab at CIR to maximize functional level fo independence to facilitate safe DC home.   Follow Up Recommendations  CIR;Supervision/Assistance - 24 hour    Equipment Recommendations  Other (comment)(TBA)    Recommendations for Other Services      Precautions / Restrictions Precautions Precautions: Fall Precaution Comments: reports diplopia when lookin g"out of the corner of her eye"       Mobility Bed Mobility Overal bed mobility: Modified Independent                Transfers Overall transfer level: Needs assistance   Transfers: Sit to/from Stand Sit to Stand: Min assist              Balance Overall balance assessment: Needs assistance   Sitting balance-Leahy Scale: Good       Standing balance-Leahy Scale: Poor; L bias; Pt shifts weight primarily on RLE and says "that feels better"                             ADL either performed or assessed with clinical judgement   ADL       Grooming: Min guard;Standing Grooming Details (indicate cue type and reason): Completed grooming task at sink. Educated to use counter to stablize trunk to reduce risk fo falls due to apparent ataxia                             Functional mobility during ADLs: Minimal assistance General ADL  Comments: Facilitation through R hip and L shoulder to weight shift and improve midline posutral control during funcitonal mobility; appears to lock out L knee into extension to compensate for weakness     Vision   Vision Assessment?: Yes Eye Alignment: Within Functional Limits Ocular Range of Motion: Impaired-to be further tested in functional context Alignment/Gaze Preference: Within Defined Limits Tracking/Visual Pursuits: Decreased smoothness of horizontal tracking;Decreased smoothness of vertical tracking Convergence: Within functional limits Visual Fields: No apparent deficits Diplopia Assessment: Objects split side to side Additional Comments: complalins of double vision when looking toward R "out of the corner of her eye". Worse with superiro fireld; attempted partial occlusion which does not help. Pt complains of diplopia when L/R eye covered. Will further assess; double vision does not appear to bother pt when looking through her glasses.  Perception     Praxis      Cognition Arousal/Alertness: Awake/alert Behavior During Therapy: Impulsive(ADHD at baseline)                                   General Comments: Per grandma, pt at baseline        Exercises     Shoulder Instructions  General Comments      Pertinent Vitals/ Pain       Pain Assessment: No/denies pain  Home Living                                          Prior Functioning/Environment              Frequency  Min 3X/week        Progress Toward Goals  OT Goals(current goals can now be found in the care plan section)  Progress towards OT goals: Progressing toward goals  Acute Rehab OT Goals Patient Stated Goal: to be independent OT Goal Formulation: With patient Time For Goal Achievement: 04/25/19 Potential to Achieve Goals: Good ADL Goals Pt Will Perform Grooming: with supervision;standing Pt Will Perform Lower Body Dressing: with supervision;sit  to/from stand;sitting/lateral leans Pt Will Transfer to Toilet: with supervision;ambulating;regular height toilet;grab bars Pt Will Perform Toileting - Clothing Manipulation and hygiene: with supervision;sitting/lateral leans;sit to/from stand Pt Will Perform Tub/Shower Transfer: with min guard assist;rolling walker;ambulating Pt/caregiver will Perform Home Exercise Program: Increased strength;Left upper extremity;With written HEP provided Additional ADL Goal #1: Pt will participate in visual perception exercises to further assess vision.  Plan Discharge plan remains appropriate    Co-evaluation                 AM-PAC OT "6 Clicks" Daily Activity     Outcome Measure   Help from another person eating meals?: None Help from another person taking care of personal grooming?: A Little Help from another person toileting, which includes using toliet, bedpan, or urinal?: A Little Help from another person bathing (including washing, rinsing, drying)?: A Little Help from another person to put on and taking off regular upper body clothing?: A Little Help from another person to put on and taking off regular lower body clothing?: A Little 6 Click Score: 19    End of Session Equipment Utilized During Treatment: Gait belt  OT Visit Diagnosis: Unsteadiness on feet (R26.81);Muscle weakness (generalized) (M62.81)   Activity Tolerance Patient tolerated treatment well   Patient Left in bed;with call bell/phone within reach;with family/visitor present   Nurse Communication Mobility status        Time:  -     Charges:    Maurie Boettcher, OT/L   Acute OT Clinical Specialist [place] Pager [phone] Office [phone]    [facility] 04/17/2019, 12:16 PM

## 2019-04-17 NOTE — Progress Notes (Signed)
PMR Admission Coordinator Pre-Admission Assessment   Patient: Jean Carlson is an 19 y.o., female MRN: [id number] DOB: [birth year] Height: 5' 4"  (162.6 cm) Weight: 84.8 kg                                                                                                                                                  Insurance Information HMO:     PPO: yes     PCP:      IPA:      80/20:      OTHER:  PRIMARY: [employer] [place]      Policy#: [id number]      Subscriber: pt CM Name: Katharine Look Phone#: [phone]     Fax#: [phone] Pre-Cert#: [id number] auth for CIR provided by Katharine Look with [facility], for admit 3/22 with updates due to fax listed above on 3/30.       Employer: n/a Benefits:  Phone #: [phone]     Name:  Eff. Date: 05/30/2017     Deduct: $200 ($74.39 met)      Out of Pocket Max: $1000 ($0 met)      Life Max: n/a CIR: 80%      SNF: 80% Outpatient: 80%     Co-Pay: 20% Home Health: 80%      Co-Pay: 20% DME: 80%     Co-Pay: 20% Providers: preferred network SECONDARY:       Policy#:       Subscriber:  CM Name:       Phone#:      Fax#:  Pre-Cert#:       Employer:  Benefits:  Phone #:      Name:  Eff. Date:      Deduct:       Out of Pocket Max:       Life Max:  CIR:       SNF:  Outpatient:      Co-Pay:  Home Health:       Co-Pay:  DME:      Co-Pay:    Medicaid Application Date:       Case Manager:  Disability Application Date:       Case Worker:    The "Data Collection Information Summary" for patients in Inpatient Rehabilitation Facilities with attached "[place] Records" was provided and verbally reviewed with: N/A   Emergency Contact Information         Contact Information     Name Relation Home Work Mobile    Salone,Richard Father [phone]        Mabe,Connie Mother [phone]           Current Medical History  Patient Admitting Diagnosis: demyelinating disorder, differential dx new MS   History of Present Illness: Lakesia Dahle. Cutillo is a  19 year old right-handed female with history of ADHD, maintained on Concerta, and tobacco abuse.  Presented 04/09/2019 with double vision and numbness as well as weakness to the left side that progressed over the last 3 weeks.  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  Multiple lesions within the brainstem.  No hemorrhage midline shift or hydrocephalus.  No cervical spinal canal stenosis.  Admission chemistries unremarkable.  Patient completed a 5-day course of Solu-Medrol on 3/19.  Therapy evaluations completed and patient was recommended for a comprehensive rehab program.    Past Medical History      Past Medical History:  Diagnosis Date  . ADHD        Family History  family history includes Diabetes in her paternal grandfather; Heart failure in her paternal grandfather; Hyperlipidemia in her maternal grandmother; Hypertension in her maternal grandmother and paternal grandfather.   Prior Rehab/Hospitalizations:  Has the patient had prior rehab or hospitalizations prior to admission? No   Has the patient had major surgery during 100 days prior to admission? No   Current Medications    Current Facility-Administered Medications:  .  acetaminophen (TYLENOL) tablet 650 mg, 650 mg, Oral, Q6H PRN **OR** acetaminophen (TYLENOL) suppository 650 mg, 650 mg, Rectal, Q6H PRN, Jonelle Sidle, Mohammad L, MD .  calcium carbonate (TUMS - dosed in mg elemental calcium) chewable tablet 200 mg of elemental calcium, 1 tablet, Oral, Q4H PRN, Samuella Cota, MD, 200 mg of elemental calcium at 04/14/19 1022 .  FLUoxetine (PROZAC) capsule 40 mg, 40 mg, Oral, Daily, Gala Romney L, MD, 40 mg at 04/17/19 0856 .  ibuprofen (ADVIL) tablet 600-800 mg, 600-800 mg, Oral, Q6H PRN, Jonelle Sidle, Mohammad L, MD .  methylphenidate (CONCERTA) CR tablet 36 mg, 36 mg, Oral, q AM,  Jonelle Sidle, Mohammad L, MD, 36 mg at 04/17/19 0855 .  polyethylene glycol (MIRALAX / GLYCOLAX) packet 17 g, 17 g, Oral, BID, Samuella Cota, MD, 17 g at 04/17/19 7342   Patients Current Diet:     Diet Order                      Diet regular Room service appropriate? Yes; Fluid consistency: Thin  Diet effective now                   Precautions / Restrictions Precautions Precautions: Fall Restrictions Weight Bearing Restrictions: No Other Position/Activity Restrictions: L knee brace not utilized today    Has the patient had 2 or more falls or a fall with injury in the past year?Yes, fell about 3 weeks PTA with L knee injury. F/u with [facility].    Prior Activity Level Limited Community (1-2x/wk): had been going out less 2/2 weakness, wasn't driving 2/2 visual deficits, no DME used at baseline   Prior Functional Level Prior Function Level of Independence: Needs assistance Gait / Transfers Assistance Needed: KI at all times for 3 weeks, per pt is waiting for ortho appointment ADL's / Homemaking Assistance Needed: Independent with most ADL; before fall 3 weeks ago was completely independent Comments: Recent fall x3 weeks ago; [place] issued by orthopedic MD. The last week pt vision, L side weakness deficits, intermittent slurred speech. Pt enjoys playing softball, does not work currently   Self Care: Did the patient need help bathing, dressing, using the toilet or eating?  Independent   Indoor Mobility: Did the patient need assistance with walking from room to room (with or without device)? Independent   Stairs:  Did the patient need assistance with internal or external stairs (with or without device)? Independent   Functional Cognition: Did the patient need help planning regular tasks such as shopping or remembering to take medications? Independent   Home Equities trader / Equipment Home Assistive Devices/Equipment: None Home Equipment: Shower seat, Grab bars - toilet, Grab  bars - tub/shower, Bedside commode, Walker - 4 wheels, Walker - 2 wheels, Transport chair   Prior Device Use: Indicate devices/aids used by the patient prior to current illness, exacerbation or injury? None of the above   Current Functional Level Cognition   Overall Cognitive Status: Within Functional Limits for tasks assessed Orientation Level: Oriented X4 General Comments: Pt with increased self-awareness this session, cuing self to align body in RW and adjust posture. Impulsive, requires cues to slow down    Extremity Assessment (includes Sensation/Coordination)   Upper Extremity Assessment: LUE deficits/detail LUE Deficits / Details: functional weakness noted, incoordinated LUE Sensation: decreased light touch LUE Coordination: decreased gross motor, decreased fine motor  Lower Extremity Assessment: LLE deficits/detail LLE Deficits / Details: MMT: 3/5 - knee extension, knee flexion, DF/PF. 2+/5 - hip flexion. VERY incoordinated with impaired ability to grade motor response LLE Sensation: decreased light touch LLE Coordination: decreased fine motor, decreased gross motor     ADLs   Overall ADL's : Needs assistance/impaired Eating/Feeding: Modified independent, Sitting Grooming: Minimal assistance, Standing, Wash/dry hands, Oral care Grooming Details (indicate cue type and reason): min assist for standing balance, stood x 3 minutes Upper Body Bathing: Set up, Sitting Lower Body Bathing: Minimal assistance, Sitting/lateral leans, Sit to/from stand, Cueing for safety Upper Body Dressing : Minimal assistance, Sitting Upper Body Dressing Details (indicate cue type and reason): due to IV line Lower Body Dressing: Minimal assistance, Sitting/lateral leans, Sit to/from stand Toilet Transfer: Minimal assistance, Ambulation, RW, Regular Toilet, Grab bars Toilet Transfer Details (indicate cue type and reason): minA to avoid plopping; pt is very impulsive Toileting- Clothing Manipulation and  Hygiene: Minimal assistance, Sit to/from stand Toileting - Clothing Manipulation Details (indicate cue type and reason): min assist for balance Functional mobility during ADLs: Minimal assistance, Rolling walker, Cueing for safety General ADL Comments: Pt limited by inpulsivity, decreased strength, decreased activity tolerance, decreased ability to care for self safely.      Mobility   Overal bed mobility: Needs Assistance Bed Mobility: Supine to Sit, Sit to Supine Supine to sit: Supervision, HOB elevated Sit to supine: Supervision, HOB elevated General bed mobility comments: supervision for lV line and safety, increased time     Transfers   Overall transfer level: Needs assistance Equipment used: Rolling walker (2 wheeled) Transfers: Sit to/from Stand Sit to Stand: Min assist General transfer comment: min assist to steady, verbal cuing for hand placement when rising (not followed); sit to stand x2 from EOB and toilet.     Ambulation / Gait / Stairs / Wheelchair Mobility   Ambulation/Gait Ambulation/Gait assistance: Min assist, +2 safety/equipment Gait Distance (Feet): 80 Feet(2x80 ft, standing rest break) Assistive device: Rolling walker (2 wheeled) Gait Pattern/deviations: Step-through pattern, Decreased stride length, Staggering right, Decreased dorsiflexion - left, Decreased weight shift to left General Gait Details: min assist to steady and guide RW from veering R as is pt prefernce. PT providing external cuing through use of floor tiles to keep RW aligned within. verbal cuing for increased LLE clearance during swing phase by L DF and knee flexion. Pt tends to hold LLE in extension for stability during both swing and stance phase. Gait  velocity: decr     Posture / Balance Balance Overall balance assessment: Needs assistance Sitting-balance support: No upper extremity supported, Feet supported Sitting balance-Leahy Scale: Fair Standing balance-Leahy Scale: Poor Standing balance  comment: min assist in static standing, standing in spinal extension with bilateral LEs locked in extension as stability when standing for ADL tasks with OT     Special needs/care consideration BiPAP/CPAP no CPM no Continuous Drip IV no Dialysis no        Days n/a Life Vest no Oxygen no Special Bed no Trach Size no Wound Vac (area) no      Location n/a Skin intact                            Bowel mgmt: continent Bladder mgmt: continent Diabetic mgmt no Behavioral consideration no Chemo/radiation no        Previous Home Environment (from acute therapy documentation) Living Arrangements: Parent Available Help at Discharge: Family, Available 24 hours/day(parents taking short term time off, up to 1 month) Type of Home: House Home Layout: One level Home Access: Stairs to enter [employer] Shower/Tub: Tub/shower unit, Architectural technologist: Programmer, systems: No Home Care Services: No Additional Comments: family is working on a ramp to entrance of front door   Discharge Living Setting Plans for Discharge Living Setting: Patient's home Type of Home at Discharge: House Discharge Home Layout: One level Discharge Home Access: Stairs to enter, Ramped entrance(plan to build ramp on front if needed) Entrance Stairs-Rails: None Entrance Stairs-Number of Steps: 3 Discharge Bathroom Shower/Tub: Tub/shower unit Discharge Bathroom Toilet: Standard Discharge Bathroom Accessibility: No Does the patient have any problems obtaining your medications?: No   Social/Family/Support Systems Anticipated Caregiver: family, coordinated by mother Marlowe Kays) Anticipated Caregiver's Contact Information: Marlowe Kays [phone] (home) Ability/Limitations of Caregiver: supervision only Caregiver Availability: 24/7 Discharge Plan Discussed with Primary Caregiver: Yes Is Caregiver In Agreement with Plan?: Yes Does Caregiver/Family have Issues with Lodging/Transportation while Pt is in Rehab?:  No     Goals/Additional Needs Patient/Family Goal for Rehab: PT/OT supervision to mod I Expected length of stay: 8-10 days Dietary Needs: regular/thin Equipment Needs: knee brace for L knee, needs f/u with [facility] Pt/Family Agrees to Admission and willing to participate: Yes Program Orientation Provided & Reviewed with Pt/Caregiver Including Roles  & Responsibilities: Yes     Decrease burden of Care through IP rehab admission: n/a     Possible need for SNF placement upon discharge: Not anticipated. Pt with good family support and mod I/supervision goals.    Patient Condition: This patient's medical and functional status has changed since the consult dated: 04/12/19 in which the Rehabilitation Physician determined and documented that the patient's condition is appropriate for intensive rehabilitative care in an inpatient rehabilitation facility. See "History of Present Illness" (above) for medical update. Functional changes are: min assist. Patient's medical and functional status update has been discussed with the Rehabilitation physician and patient remains appropriate for inpatient rehabilitation. Will admit to inpatient rehab today.   Preadmission Screen Completed By:  Michel Santee, PT,  DPT 04/17/2019 9:44 AM ______________________________________________________________________   Discussed status with Dr. Naaman Plummer on 04/17/19 at 9:58 AM  and received approval for admission today.   Admission Coordinator:  Michel Santee, PT, DPT time 9:58 AM Sudie Grumbling 04/17/19          Cosigned by: Meredith Staggers, MD at 04/17/2019 10:21 AM

## 2019-04-17 NOTE — H&P (Signed)
Physical Medicine and Rehabilitation Admission H&P        Chief Complaint  Patient presents with  . Weakness  . Fall  : HPI: Jean Carlson is a 19 year old right-handed female with history of ADHD maintained on Concerta and tobacco abuse.  Per chart review lives with her grandmother.  1 level home.  Presented 04/09/2019 with double vision and numbness as well as weakness to the left side that progressed over the last 3 weeks.  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  Multiple lesions within the brainstem.  No hemorrhage midline shift or hydrocephalus.  No cervical spinal canal stenosis.  Admission chemistries unremarkable.  Patient completed a 5-day course of Solu-Medrol.  Therapy evaluations completed and patient was admitted for a comprehensive rehab program   Review of Systems  Constitutional: Negative for chills and fever.  HENT: Negative for hearing loss.   Eyes: Positive for blurred vision and double vision.  Respiratory: Negative for cough and shortness of breath.   Cardiovascular: Positive for leg swelling.  Gastrointestinal: Positive for constipation. Negative for heartburn, nausea and vomiting.  Genitourinary: Negative for dysuria, flank pain and hematuria.  Musculoskeletal: Positive for joint pain and myalgias.  Skin: Negative for rash.  Neurological: Positive for tremors, sensory change, focal weakness and weakness.  All other systems reviewed and are negative.       Past Medical History:  Diagnosis Date  . ADHD      History reviewed. No pertinent surgical history.      Family History  Problem Relation Age of Onset  . Hyperlipidemia Maternal Grandmother    . Hypertension Maternal Grandmother    . Diabetes Paternal Grandfather    . Hypertension Paternal Grandfather    . Heart failure Paternal Grandfather       Social History:  reports that she has been smoking. She has never used smokeless tobacco. She reports previous alcohol use. She reports that she does not use drugs. Allergies:       Allergies  Allergen Reactions  . Amoxicillin Other (See Comments)      CAUSED BAD NIGHTMARES  . Codeine Nausea Only      SEVERE NAUSEA  . Vyvanse [Lisdexamfetamine] Other (See Comments)      "Caused depression to the point of self-harm"          Medications Prior to Admission  Medication Sig Dispense Refill  . FLUoxetine (PROZAC) 40 MG capsule Take 40 mg by mouth in the morning.      [name] ibuprofen (ADVIL) 200 MG tablet Take 600-800 mg by mouth every 6 (six) hours as needed for headache or mild pain.      . LO LOESTRIN FE 1 MG-10 MCG / 10 MCG tablet Take 1 tablet by mouth daily.       . methylphenidate 36 MG PO CR tablet Take 36 mg by mouth in the morning.      . meloxicam (MOBIC) 7.5 MG tablet Take 1 tablet (7.5 mg total) by mouth daily. (Patient not taking: Reported on 04/09/2019) 30 tablet 0      Drug Regimen Review Drug regimen was reviewed and remains appropriate with no significant issues identified   Home: Home Living Family/patient expects to be discharged to:: Private residence Living Arrangements: Parent Available Help at Discharge: Family, Available 24 hours/day(parents taking short term time off, up to 1 month) Type  of Home: House Home Access: Stairs to enter Home Layout: One level Bathroom Shower/Tub: Hydrographic surveyor, Engineer, building services: Pharmacist, community: No Home Equipment: Information systems manager, Grab bars - toilet, Grab bars - tub/shower, Bedside commode, Walker - 4 wheels, Walker - 2 wheels, Transport chair Additional Comments: family is working on a ramp to entrance of front door   Functional History: Prior Function Level of Independence: Needs assistance Gait / Transfers Assistance Needed: KI at all times for 3 weeks, per pt is waiting for ortho appointment ADL's /  Homemaking Assistance Needed: Independent with most ADL; before fall 3 weeks ago was completely independent Comments: Recent fall x3 weeks ago; KI issued by orthopedic MD. The last week pt vision, L side weakness deficits, intermittent slurred speech. Pt enjoys playing softball, does not work currently   Functional Status:  Mobility: Bed Mobility Overal bed mobility: Needs Assistance Bed Mobility: Supine to Sit, Sit to Supine Supine to sit: Supervision, HOB elevated Sit to supine: Supervision, HOB elevated General bed mobility comments: supervision for lV line and safety, increased time Transfers Overall transfer level: Needs assistance Equipment used: Rolling walker (2 wheeled) Transfers: Sit to/from Stand Sit to Stand: Min assist General transfer comment: min assist to steady, verbal cuing for hand placement when rising (not followed); sit to stand x2 from EOB and toilet. Ambulation/Gait Ambulation/Gait assistance: Min assist, +2 safety/equipment Gait Distance (Feet): 80 Feet(2x80 ft, standing rest break) Assistive device: Rolling walker (2 wheeled) Gait Pattern/deviations: Step-through pattern, Decreased stride length, Staggering right, Decreased dorsiflexion - left, Decreased weight shift to left General Gait Details: min assist to steady and guide RW from veering R as is pt prefernce. PT providing external cuing through use of floor tiles to keep RW aligned within. verbal cuing for increased LLE clearance during swing phase by L DF and knee flexion. Pt tends to hold LLE in extension for stability during both swing and stance phase. Gait velocity: decr   ADL: ADL Overall ADL's : Needs assistance/impaired Eating/Feeding: Modified independent, Sitting Grooming: Minimal assistance, Standing, Wash/dry hands, Oral care Grooming Details (indicate cue type and reason): min assist for standing balance, stood x 3 minutes Upper Body Bathing: Set up, Sitting Lower Body Bathing: Minimal  assistance, Sitting/lateral leans, Sit to/from stand, Cueing for safety Upper Body Dressing : Minimal assistance, Sitting Upper Body Dressing Details (indicate cue type and reason): due to IV line Lower Body Dressing: Minimal assistance, Sitting/lateral leans, Sit to/from stand Toilet Transfer: Minimal assistance, Ambulation, RW, Regular Toilet, Grab bars Toilet Transfer Details (indicate cue type and reason): minA to avoid plopping; pt is very impulsive Toileting- Clothing Manipulation and Hygiene: Minimal assistance, Sit to/from stand Toileting - Clothing Manipulation Details (indicate cue type and reason): min assist for balance Functional mobility during ADLs: Minimal assistance, Rolling walker, Cueing for safety General ADL Comments: Pt limited by inpulsivity, decreased strength, decreased activity tolerance, decreased ability to care for self safely.    Cognition: Cognition Overall Cognitive Status: Within Functional Limits for tasks assessed Orientation Level: Oriented X4 Cognition Arousal/Alertness: Awake/alert Behavior During Therapy: Impulsive Overall Cognitive Status: Within Functional Limits for tasks assessed General Comments: Pt with increased self-awareness this session, cuing self to align body in RW and adjust posture. Impulsive, requires cues to slow down   Physical Exam: Blood pressure 96/62, pulse 82, temperature 98.4 F (36.9 C), temperature source Oral, resp. rate 16, height 5\' 4"  (1.626 m), weight 84.8 kg, last menstrual period 03/17/2019, SpO2 100 %. Physical Exam  Constitutional: She is oriented  to person, place, and time. She appears well-developed. She appears distressed.  HENT:  Head: Normocephalic and atraumatic.  Eyes: Pupils are equal, round, and reactive to light. Conjunctivae are normal. Right eye exhibits no discharge. Left eye exhibits no discharge.  Neck: No thyromegaly present.  Cardiovascular: Normal rate and regular rhythm. Exam reveals no friction  rub.  No murmur heard. Respiratory: Effort normal. No respiratory distress. She has no wheezes.  GI: Soft. She exhibits no distension. There is no abdominal tenderness.  Musculoskeletal:     Cervical back: Normal range of motion.     Comments: No obvious pain left knee with AROM/PROM. No joint effusion  Neurological: She is alert and oriented to person, place, and time.  Tracks to all fields, gaze slightly dysconjugate when looking right, +diplopia with rightward gaze. Mild left PD. Strength 5/5 bilateral UE. RLE 5/5. LLE 4+/5. Decreased LT LLE, foot more affected than lower leg. Decreased HTS LLE. Reasonable insight and awareness but distractible, tangential.   Skin: Skin is warm and dry.  Psychiatric: She has a normal mood and affect.      Lab Results Last 48 Hours  No results found for this or any previous visit (from the past 48 hour(s)).   Imaging Results (Last 48 hours)  No results found.           Medical Problem List and Plan: 1.  Decreased functional mobility secondary to demyelinating disorder/multiple sclerosis.  IV Solu-Medrol completed.  Patient to follow-up Dr. Felecia Shelling at the outpatient neurologic center             -patient may   shower             -ELOS/Goals: 8-10 days, supervision to mod I 2.  Antithrombotics: -DVT/anticoagulation: SCDs             -antiplatelet therapy: N/A 3. Pain Management: Tylenol as needed 4. Mood: Prozac 40 mg daily             -antipsychotic agents: N/A 5. Neuropsych: This patient is capable of making decisions on her own behalf. 6. Skin/Wound Care: Routine skin checks 7. Fluids/Electrolytes/Nutrition: Routine in and outs with follow-up chemistries 8.  ADHD.  Continue Concerta 30 mg daily 9.  Tobacco abuse.  Counseling 10. Hx of left pain after fall. Pt s/p MRI ordered by ortho on 04/07/19 which revealed bone contusion along the anterior aspect of lateral tibial plateau. 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 No ligament or  meniscal tears.              -conservative care, pain relief, fall prevention/gait mechanics with PT             -pt reports hx of left knee hyperextension (likely related to #1) during gait which predisposed to her pain/falls.             -pt has knee brace at home given to her by ortho. Asked her to have it brought in.         Lavon Paganini Angiulli, PA-C 04/17/2019  I have personally performed a face to face diagnostic evaluation of this patient and formulated the key components of the plan.  Additionally, I have personally reviewed laboratory data, imaging studies, as well as relevant notes and concur with the physician assistant's documentation above.  The patient's status has not changed from the original H&P.  Any changes in documentation from the acute care chart have been noted above.  Meredith Staggers, MD, [facility]

## 2019-04-17 NOTE — Progress Notes (Signed)
Physical Medicine and Rehabilitation Consult Reason for Consult: Left side weakness and numbness Referring Physician: [facility]     HPI: Jean Carlson is a 19 y.o. right-handed female with history of ADHD maintained on Concerta and tobacco abuse.  Per chart review patient lives with her grandmother.  1 level home.  Needed assistance prior to admission.  Presented 04/09/2019 with double vision and numbness as well as weakness to the left side that had progressed over the last 3 weeks.  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  Multiple lesions within the brainstem.  No hemorrhage midline shift or hydrocephalus.  No cervical spinal canal stenosis.  Admission chemistries were unremarkable.  Neurology follow-up placed on 5 days Solu-Medrol.  Therapy evaluations completed with recommendations of physical medicine rehab consult.     Review of Systems  Constitutional: Negative for chills and fever.  HENT: Negative for hearing loss.   Eyes: Positive for blurred vision and double vision.  Respiratory: Negative for cough and shortness of breath.   Cardiovascular: Positive for leg swelling. Negative for chest pain and palpitations.  Gastrointestinal: Positive for constipation. Negative for heartburn, nausea and vomiting.  Genitourinary: Negative for dysuria, flank pain and hematuria.  Musculoskeletal: Positive for myalgias.  Skin: Negative for rash.  Neurological: Positive for weakness.  All other systems reviewed and are negative.       Past Medical History:  Diagnosis Date  . ADHD      History reviewed. No pertinent surgical history.      Family History  Problem Relation Age of Onset  . Hyperlipidemia Maternal Grandmother    . Hypertension Maternal Grandmother    . Diabetes Paternal Grandfather    . Hypertension Paternal  Grandfather    . Heart failure Paternal Grandfather      Social History:  reports that she has been smoking. She has never used smokeless tobacco. She reports previous alcohol use. She reports that she does not use drugs. Allergies:       Allergies  Allergen Reactions  . Amoxicillin Other (See Comments)      CAUSED BAD NIGHTMARES  . Codeine Nausea Only      SEVERE NAUSEA  . Vyvanse [Lisdexamfetamine] Other (See Comments)      "Caused depression to the point of self-harm"          Medications Prior to Admission  Medication Sig Dispense Refill  . FLUoxetine (PROZAC) 40 MG capsule Take 40 mg by mouth in the morning.      [name] ibuprofen (ADVIL) 200 MG tablet Take 600-800 mg by mouth every 6 (six) hours as needed for headache or mild pain.      . LO LOESTRIN FE 1 MG-10 MCG / 10 MCG tablet Take 1 tablet by mouth daily.       . methylphenidate 36 MG PO CR tablet Take 36 mg by mouth in the morning.      . meloxicam (MOBIC) 7.5 MG tablet Take 1 tablet (7.5 mg total) by mouth daily. (Patient not taking: Reported on 04/09/2019) 30 tablet 0      Home: Home Living Family/patient expects to be discharged to:: Private residence Living Arrangements: Parent, Other relatives(grandma) Available Help at Discharge: Family, Available 24 hours/day(parents taking short term time off, up to 1 month) Type of Home: House Home Access: Stairs to enter Home Layout: One  level Bathroom Shower/Tub: Tub/shower unit, Buyer, retail: No Home Equipment: Information systems manager, Grab bars - toilet, Grab bars - tub/shower, Bedside commode, Walker - 4 wheels, Walker - 2 wheels, Transport chair Additional Comments: family is working on a ramp to entrance of front door    Functional History: Prior Function Level of Independence: Needs assistance Gait / Transfers Assistance Needed: KI at all times for 3 weeks, per pt is waiting for ortho appointment ADL's / Homemaking Assistance Needed:  Independent with most ADL; before fall 3 weeks ago was completely independent Comments: Recent fall x3 weeks ago; KI issued by orthopedic MD. The last week pt vision, L side weakness deficits, intermittent slurred speech. Pt enjoys playing softball, does not work currently Functional Status:  Mobility: Bed Mobility Overal bed mobility: Needs Assistance Bed Mobility: Supine to Sit, Sit to Supine Supine to sit: Min guard, HOB elevated Sit to supine: Min guard, HOB elevated General bed mobility comments: min guard for supine<>sit, pt with use of HOB elevation and hooking RLE under LLE as well as physically lifting LLE. Increased time and effort. Transfers Overall transfer level: Needs assistance Equipment used: Rolling walker (2 wheeled) Transfers: Sit to/from Stand Sit to Stand: Min assist General transfer comment: Min assist to steady upon standing, pt encouraged to slow sequencing when rising for safety as pt tends to move quickly. verbal cuing for hand placement when rising. STS x2, from EOB and from toilet. Ambulation/Gait Ambulation/Gait assistance: Min assist, +2 safety/equipment Gait Distance (Feet): 75 Feet(2x75 ft, standing rest break) Assistive device: Rolling walker (2 wheeled) Gait Pattern/deviations: Step-through pattern, Decreased stride length, Staggering right, Decreased dorsiflexion - left, Decreased weight shift to left General Gait Details: Min assist for steadying, physically guiding pt and RW as pt with heavy R stagger in hallway. External cuing with use of floor tiles to control LLE step length and width, to improve coordination during gait. Pt with use of momentum during LLE swing phase. Gait velocity: decr   ADL: ADL Overall ADL's : Needs assistance/impaired Eating/Feeding: Modified independent, Sitting Grooming: Minimal assistance, Standing Grooming Details (indicate cue type and reason): Pt unable to stand upright for >10 secs at a time as pt's  BUEs  fatigue Upper Body Bathing: Set up, Sitting Lower Body Bathing: Minimal assistance, Sitting/lateral leans, Sit to/from stand, Cueing for safety Upper Body Dressing : Set up, Sitting Lower Body Dressing: Minimal assistance, Sitting/lateral leans, Sit to/from stand Toilet Transfer: Minimal assistance, Ambulation, Regular Toilet, Grab bars Toilet Transfer Details (indicate cue type and reason): minA to avoid plopping; pt is very impulsive Toileting- Clothing Manipulation and Hygiene: Minimal assistance, Sitting/lateral lean, Sit to/from stand Toileting - Clothing Manipulation Details (indicate cue type and reason): Cues to use RW for stability Functional mobility during ADLs: Min guard, Minimal assistance, Rolling walker, Cueing for safety, Cueing for sequencing General ADL Comments: Pt limited by inpulsivity, decreased strength, decreased activity tolerance, decreased ability to care for self safely.    Cognition: Cognition Overall Cognitive Status: Within Functional Limits for tasks assessed Orientation Level: Oriented X4 Cognition Arousal/Alertness: Awake/alert Behavior During Therapy: WFL for tasks assessed/performed, Impulsive Overall Cognitive Status: Within Functional Limits for tasks assessed General Comments: Periods of impulsivity, lacks insight into deficits.   Blood pressure 134/70, pulse 94, temperature 98.3 F (36.8 C), temperature source Oral, resp. rate 18, height 5\' 4"  (1.626 m), weight 84.8 kg, last menstrual period 03/17/2019, SpO2 98 %.   Physical Exam    General: Alert and oriented x 3, No  apparent distress, anxious HEENT: Head is normocephalic, atraumatic, PERRLA, EOMI, sclera anicteric, oral mucosa pink and moist, dentition intact, ext ear canals clear,  Neck: Supple without JVD or lymphadenopathy Heart: Reg rate and rhythm. No murmurs rubs or gallops Chest: CTA bilaterally without wheezes, rales, or rhonchi; no distress Abdomen: Soft, non-tender, non-distended,  bowel sounds positive. Extremities: No clubbing, cyanosis, or edema. Pulses are 2+ Skin: Clean and intact without signs of breakdown Neuro: Patient is alert.  She is somewhat impulsive.  Follows commands.  Oriented x3. Mild dysarthria. 5/5 strength throughout right side.  4/5 on left side with 4-/5 left hand grip. Diminished sensation over left foot.  Psych: Verbose, tanggential. Pt is cooperative. Anxious.    Lab Results Last 24 Hours  No results found for this or any previous visit (from the past 24 hour(s)).   Imaging Results (Last 48 hours)  No results found.       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 Does the need for close, 24 hr/day medical supervision in concert with the patient's rehab needs make it unreasonable for this patient to be served in a less intensive setting? Yes 2. Co-Morbidities requiring supervision/potential complications: ADHD, tobacco use disorder, generalized anxiety disorder 3. Due to bladder management, bowel management, safety, skin/wound care, disease management, medication administration, pain management and patient education, does the patient require 24 hr/day rehab nursing? Yes 4. Does the patient require coordinated care of a physician, rehab nurse, therapy disciplines of PT, OT to address physical and functional deficits in the context of the above medical diagnosis(es)? Yes Addressing deficits in the following areas: balance, endurance, locomotion, strength, transferring, bowel/bladder control, bathing and dressing 5. Can the patient actively participate in an intensive therapy program of at least 3 hrs of therapy per day at least 5 days per week? Yes 6. The potential for patient to make measurable gains  while on inpatient rehab is excellent 7. Anticipated functional outcomes upon discharge from inpatient rehab are modified independent  with PT, modified independent with OT, independent with SLP. 8. Estimated rehab length of stay to reach the above functional goals is: 10-14 days 9. Anticipated discharge destination: Home 10. Overall Rehab/Functional Prognosis: excellent   RECOMMENDATIONS: This patient's condition is appropriate for continued rehabilitative care in the following setting: CIR Patient has agreed to participate in recommended program. Yes Note that insurance prior authorization may be required for reimbursement for recommended care.   Comment: Mrs. Monahan would be an excellent CIR candidate and is agreeable to CIR admission. She has good family support.   Lavon Paganini Angiulli, PA-C 04/12/2019    I have personally performed a face to face diagnostic evaluation, including, but not limited to relevant history and physical exam findings, of this patient and developed relevant assessment and plan.  Additionally, I have reviewed and concur with the physician assistant's documentation above.   Leeroy Cha, MD

## 2019-04-17 NOTE — Progress Notes (Signed)
Patient ID: Jean Carlson, female   DOB: 02/24/2000, 19 y.o.   MRN: [id number] Patient arrived from [facility] via wheelchair with RN, family, and patient belongings. Patient and family oriented to rehab process, fall prevention plan, safety plan, rehab schedule, health resource notebook, and nurse call system. Patient currently resting in bed with no complaints of pain, family at bedside, and bed alarm on.

## 2019-04-17 NOTE — Progress Notes (Signed)
Physical Therapy Treatment Patient Details Name: Jean Carlson MRN: [id number] DOB: [birth year] Today's Date: 04/17/2019    History of Present Illness Pt is a 19 yo s/p vision changes, L side weakness and numbness, recent fall x3 weeks ago. 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 PMHx: ADHD.    PT Comments    Pt very motivated to participate in PT, and is making slow progress with gait parameters with use of RW. PT challenged pt with gait training without device, pt pretty unsteady with episode of scissoring requiring PT assist to correct. PT also tolerated limited LE strengthening, limited by weakness and incoordination. PT to continue to follow acutely, hopeful to CIR today.    Follow Up Recommendations  CIR     Equipment Recommendations  Other (comment)(TBD)    Recommendations for Other Services Rehab consult     Precautions / Restrictions Precautions Precautions: Fall Precaution Comments: diplopia Restrictions Weight Bearing Restrictions: No Other Position/Activity Restrictions: L knee brace not utilized today    Mobility  Bed Mobility Overal bed mobility: Modified Independent Bed Mobility: Supine to Sit;Sit to Supine     Supine to sit: Supervision;HOB elevated Sit to supine: Supervision;HOB elevated   General bed mobility comments: supervision for safety, increased time and difficulty coordinating LUE and LLE towards EOB.  Transfers Overall transfer level: Needs assistance Equipment used: Rolling walker (2 wheeled) Transfers: Sit to/from Stand Sit to Stand: Min guard         General transfer comment: Min guard for safety, verbal cuing for safe use of RW.  Ambulation/Gait Ambulation/Gait assistance: Min assist Gait Distance (Feet): 100 Feet(2x100, standing rest break) Assistive device: Rolling walker (2 wheeled);1 person hand held assist Gait Pattern/deviations: Step-through  pattern;Decreased stride length;Staggering right;Decreased weight shift to left Gait velocity: decr   General Gait Details: Min assist for steadying, guiding pt and aligning pt with external cuing of floor tiles. LLE incoordination present with scissoring and LOB requiring PT assist to correct. 20 ft ambulation without use of RW, with HHA. Min assist to steady, widening BOS to accomodate with scissoring noted   Stairs             Wheelchair Mobility    Modified Rankin (Stroke Patients Only)       Balance Overall balance assessment: Needs assistance   Sitting balance-Leahy Scale: Good       Standing balance-Leahy Scale: Poor                              Cognition Arousal/Alertness: Awake/alert Behavior During Therapy: Impulsive Overall Cognitive Status: Within Functional Limits for tasks assessed Area of Impairment: Safety/judgement                         Safety/Judgement: Decreased awareness of safety;Decreased awareness of deficits     General Comments: Requires multimodal cuing for safety in hallway ambulation      Exercises Other Exercises Other Exercises: steps ups bilaterally, 8" step, bilateral LEs leading x5    General Comments        Pertinent Vitals/Pain Pain Assessment: No/denies pain Pain Intervention(s): Monitored during session    Home Living                      Prior Function            PT Goals (current goals can  now be found in the care plan section) Acute Rehab PT Goals Patient Stated Goal: to be independent PT Goal Formulation: With patient Time For Goal Achievement: 04/26/19 Potential to Achieve Goals: Good Progress towards PT goals: Progressing toward goals    Frequency    Min 3X/week      PT Plan Current plan remains appropriate    Co-evaluation              AM-PAC PT "6 Clicks" Mobility   Outcome Measure  Help needed turning from your back to your side while in a flat bed  without using bedrails?: A Little Help needed moving from lying on your back to sitting on the side of a flat bed without using bedrails?: A Little Help needed moving to and from a bed to a chair (including a wheelchair)?: A Little Help needed standing up from a chair using your arms (e.g., wheelchair or bedside chair)?: A Little Help needed to walk in hospital room?: A Little Help needed climbing 3-5 steps with a railing? : A Little 6 Click Score: 18    End of Session Equipment Utilized During Treatment: Gait belt Activity Tolerance: Patient tolerated treatment well Patient left: in bed;with call bell/phone within reach;with family/visitor present Nurse Communication: Mobility status PT Visit Diagnosis: Other abnormalities of gait and mobility (R26.89);Muscle weakness (generalized) (M62.81);Other symptoms and signs involving the nervous system (R29.898)     Time: 0865-7846 PT Time Calculation (min) (ACUTE ONLY): 19 min  Charges:  $Gait Training: 8-22 mins                     Chimamanda Siegfried E, PT Acute Rehabilitation Services Pager [phone]  Office [phone]  Tyrone Apple D Despina Hidden 04/17/2019, 2:14 PM

## 2019-04-17 NOTE — Progress Notes (Signed)
Inpatient Rehabilitation Medication Review by a Pharmacist  A complete drug regimen review was completed for this patient to identify any potential clinically significant medication issues.  Clinically significant medication issues were identified:  no   Pharmacist comments: No issues.  Time spent performing this drug regimen review (minutes):  5    Lulu Riding, PharmD PGY1 Pharmacy Resident  Please check AMION for all [facility] Pharmacy phone numbers After 10:00 PM, call Main Pharmacy [phone]  04/17/2019 4:53 PM

## 2019-04-17 NOTE — Discharge Summary (Signed)
Physician Discharge Summary  Jean Carlson EXN:[id number] DOB: 2000/12/28 DOA: 04/09/2019  PCP: Milus Height, PA  Admit date: 04/09/2019 Discharge date: 04/17/2019  Recommendations for Outpatient Follow-up:  1. Follow-up demyelinating disorder as an outpatient.  Note appointment below will have to be rescheduled.  Defer this to CIR.  Follow-up Information    Sater, Pearletha Furl, MD. Go on 04/19/2019.   Specialty: Neurology Why: An appointment has been made for  04/19/19 at 4pm . Wear a mask.  Contact information: [address] [place] [address] [phone]            Discharge Diagnoses: Principal diagnosis is #1 1. 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 2. ADHD  Discharge Condition: improved Disposition: CIR  Diet recommendation: regular  Filed Weights   04/09/19 1509  Weight: 84.8 kg    History of present illness:  19 year old woman presented with several day history of diplopia, difficulty speaking and swallowing, left-sided weakness.  MRI of the brain and cervical spine showed multiple lesions suggestive of demyelinating disorder.   Hospital Course:  Patient was seen by neurology and started on high-dose steroids, treated with 5 doses with some improvement in neurologic symptoms.  Evaluated by therapy with recommendation for CIR.  Hospitalization was prolonged by lack of CIR bed availability.  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 --Status post 5 doses of Solu-Medrol.    --Outpatient referral made to Dr. Olive Bass [facility] neurology for consideration of disease modifying therapy.  ADHD --Remains stable.  Continue Concerta and Prozac  Tobacco use disorder  Significant Hospital Events    3/14 admitted for further evaluation of suspected demyelinating disorder  Consults:   Neurology  Procedures:     Significant Diagnostic Tests:   SARS-CoV-2 negative  Chest x-ray no acute disease  CT head no acute disease  MRI brain and cervical spine: Multiple white matter lesions scattered throughout the brain and cervical spinal cord most consistent with demyelinating disease. .   Today's assessment: S: Feels okay today.  Still has diplopia when turning head to the right.  Still has tremors or discoordination of the left arm especially when leaning on it.  Left foot numbness decreasing. O: Vitals:  Vitals:   04/17/19 0000 04/17/19 0603  BP: 130/79 96/62  Pulse: 78 82  Resp: 20 16  Temp: 98.4 F (36.9 C) 98.4 F (36.9 C)  SpO2: 100% 100%    Constitutional:  . Appears calm and comfortable Respiratory:  . CTA bilaterally, no w/r/r.  . Respiratory effort normal.  Cardiovascular:  . RRR, no m/r/g . No LE extremity edema   Neurologic:  . No gross change Psychiatric:  . judgement and insight appear normal . Mental status o Mood, affect appropriate   Discharge Instructions  Discharge Instructions    Ambulatory referral to Neurology   Complete by: As directed    An appointment is requested in approximately: 1 week   Increase activity slowly   Complete by: As directed      Allergies as of 04/17/2019      Reactions   Amoxicillin Other (See Comments)   CAUSED BAD NIGHTMARES   Codeine  Nausea Only   SEVERE NAUSEA   Vyvanse [lisdexamfetamine] Other (See Comments)   "Caused depression to the point of self-harm"      Medication List    STOP taking these medications   meloxicam 7.5 MG tablet Commonly known as: MOBIC     TAKE these medications   calcium  carbonate 500 MG chewable tablet Commonly known as: TUMS - dosed in mg elemental calcium Chew 1 tablet (200 mg of elemental calcium total) by mouth every 4 (four) hours as needed for indigestion or heartburn.   FLUoxetine 40 MG capsule Commonly known as: PROZAC Take 40 mg by mouth in the morning.   ibuprofen 200 MG tablet Commonly known as: ADVIL Take 600-800 mg by mouth every 6 (six) hours as needed for headache or mild pain.   Lo Loestrin Fe 1 MG-10 MCG / 10 MCG tablet Generic drug: Norethindrone-Ethinyl Estradiol-Fe Biphas Take 1 tablet by mouth daily.   methylphenidate 36 MG CR tablet Commonly known as: CONCERTA Take 36 mg by mouth in the morning.   polyethylene glycol 17 g packet Commonly known as: MIRALAX / GLYCOLAX Take 17 g by mouth 2 (two) times daily.      Allergies  Allergen Reactions  . Amoxicillin Other (See Comments)    CAUSED BAD NIGHTMARES  . Codeine Nausea Only    SEVERE NAUSEA  . Vyvanse [Lisdexamfetamine] Other (See Comments)    "Caused depression to the point of self-harm"    The results of significant diagnostics from this hospitalization (including imaging, microbiology, ancillary and laboratory) are listed below for reference.    Significant Diagnostic Studies: CT Head Wo Contrast  Result Date: 04/09/2019 CLINICAL DATA:  Left-sided neuro deficits EXAM: CT HEAD WITHOUT CONTRAST TECHNIQUE: Contiguous axial images were obtained from the base of the skull through the vertex without intravenous contrast. COMPARISON:  None. FINDINGS: Brain: No evidence of acute infarction, hemorrhage, hydrocephalus, extra-axial collection or mass lesion/mass effect. Vascular: No hyperdense vessel or unexpected calcification. Skull: Normal. Negative for fracture or focal lesion. Sinuses/Orbits: No acute finding. Other: None. IMPRESSION: No acute intracranial pathology. Electronically Signed   By: Lauralyn Primes M.D.   On: 04/09/2019 17:29   MR BRAIN W WO CONTRAST  Result  Date: 04/09/2019 CLINICAL DATA:  Ataxia. Weakness and numbness of the left upper and lower extremities. Slurred speech. Double vision. Symptoms for several days. EXAM: MRI HEAD WITHOUT AND WITH CONTRAST MRI CERVICAL SPINE WITHOUT AND WITH CONTRAST TECHNIQUE: Multiplanar, multiecho pulse sequences of the brain and surrounding structures, and cervical spine, to include the craniocervical junction and cervicothoracic junction, were obtained without and with intravenous contrast. CONTRAST:  7.6mL GADAVIST GADOBUTROL 1 MMOL/ML IV SOLN COMPARISON:  Head CT 04/09/2019 FINDINGS: MRI HEAD FINDINGS Brain: There are multiple (15-20) hyperintense T2-weighted signal lesions scattered throughout the cerebral white matter, the brainstem and the cervical spine. The largest lesions are within the right frontal periventricular white matter. The largest lesion measures 1.9 x 1.1 cm. There is no midline shift. There is mild edema surrounding the larger lesions. Multiple lesions involve the corpus callosum. There is no intracranial hemorrhage or extra-axial collection. Normal appearance of the dura. No hydrocephalus. Vascular: Normal flow voids. Skull: Normal calvarium. Sinuses/Orbits: Clear sinuses. No orbital abnormality visible. Other: None MRI CERVICAL SPINE FINDINGS Alignment: Straightening of normal cervical lordosis but otherwise normal alignment. Vertebrae: Normal marrow signal. Cord: There are multiple edematous, hyperintense T2-weighted signal lesions of the spinal cord, most notably at C2, C3 and C4-5. There is mild contrast enhancement  at the C4-5 level. Posterior Fossa, vertebral arteries, paraspinal tissues: Negative. Disc levels: There is no disc herniation, spinal canal stenosis or neural foraminal stenosis. IMPRESSION: 1. 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  2. Multiple lesions within the brainstem may account for reported diplopia due to involvement of gaze coordination pathways. 3. No hemorrhage, midline shift or hydrocephalus. 4. No cervical spinal canal stenosis. Electronically Signed   By: Ulyses Jarred M.D.   On: 04/09/2019 23:23   MR CERVICAL SPINE W WO CONTRAST  Result Date: 04/09/2019 CLINICAL DATA:  Ataxia. Weakness and numbness of the left upper and lower extremities. Slurred speech. Double vision. Symptoms for several days. EXAM: MRI HEAD WITHOUT AND WITH CONTRAST MRI CERVICAL SPINE WITHOUT AND WITH CONTRAST TECHNIQUE: Multiplanar, multiecho pulse sequences of the brain and surrounding structures, and cervical spine, to include the craniocervical junction and cervicothoracic junction, were obtained without and with intravenous contrast. CONTRAST:  7.19mL GADAVIST GADOBUTROL 1 MMOL/ML IV SOLN COMPARISON:  Head CT 04/09/2019 FINDINGS: MRI HEAD FINDINGS Brain: There are multiple (15-20) hyperintense T2-weighted signal lesions scattered throughout the cerebral white matter, the brainstem and the cervical spine. The largest lesions are within the right frontal periventricular white matter. The largest lesion measures 1.9 x 1.1 cm. There is no midline shift. There is mild edema surrounding the larger lesions. Multiple lesions involve the corpus callosum. There is no intracranial hemorrhage or extra-axial collection. Normal appearance of the dura. No hydrocephalus. Vascular: Normal flow voids. Skull: Normal calvarium. Sinuses/Orbits: Clear sinuses. No orbital abnormality visible. Other: None MRI CERVICAL SPINE FINDINGS Alignment: Straightening of normal cervical lordosis but otherwise normal alignment. Vertebrae: Normal marrow signal. Cord: There are multiple edematous, hyperintense T2-weighted signal lesions of the spinal cord, most notably at C2, C3 and C4-5. There is mild contrast enhancement at the C4-5 level. Posterior Fossa, vertebral arteries, paraspinal  tissues: Negative. Disc levels: There is no disc herniation, spinal canal stenosis or neural foraminal stenosis. IMPRESSION: 1. 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 2. Multiple lesions within the brainstem may account for reported diplopia due to involvement of gaze coordination pathways. 3. No hemorrhage, midline shift or hydrocephalus. 4. No cervical spinal canal stenosis. Electronically Signed   By: Ulyses Jarred M.D.   On: 04/09/2019 23:23   DG Chest Port 1 View  Result Date: 04/10/2019 CLINICAL DATA:  Fall.  Weakness. EXAM: PORTABLE CHEST 1 VIEW COMPARISON:  None. FINDINGS: The heart size and mediastinal contours are within normal limits. Both lungs are clear. The visualized skeletal structures are unremarkable. IMPRESSION: No active disease. Electronically Signed   By: Ulyses Jarred M.D.   On: 04/10/2019 01:15    Microbiology: Recent Results (from the past 240 hour(s))  SARS CORONAVIRUS 2 (TAT 6-24 HRS) Nasopharyngeal Nasopharyngeal Swab     Status: None   Collection Time: 04/09/19 11:59 PM   Specimen: Nasopharyngeal Swab  Result Value Ref Range Status   SARS Coronavirus 2 NEGATIVE NEGATIVE Final    Comment: (NOTE) SARS-CoV-2 target nucleic acids are NOT DETECTED. The SARS-CoV-2 RNA is generally detectable in upper and lower respiratory specimens during the acute phase of infection. Negative results do not preclude SARS-CoV-2 infection, do not rule out co-infections with other pathogens, and should not be used as the sole basis for treatment or other patient  management decisions. Negative results must be combined with clinical observations, patient history, and epidemiological information. The expected result is Negative. Fact Sheet for Patients: HairSlick.no Fact Sheet for Healthcare  Providers: quierodirigir.com This test is not yet approved or cleared by the [place] FDA and  has been authorized for detection and/or diagnosis of SARS-CoV-2 by FDA under an Emergency Use Authorization (EUA). This EUA will remain  in effect (meaning this test can be used) for the duration of the COVID-19 declaration under Section 56 4(b)(1) of the Act, 21 U.S.C. section 360bbb-3(b)(1), unless the authorization is terminated or revoked sooner. Performed at [facility], [address] [place], [place] [address]      Principal Problem:   Demyelinating disorder [facility]) Active Problems:   MS (multiple sclerosis) (HCC)   Tobacco abuse   ADHD   Time coordinating discharge: 25 minutes  Signed:  [name], MD  [facility]  04/17/2019, 9:56 AM

## 2019-04-17 NOTE — Progress Notes (Signed)
Inpatient Rehab Admissions Coordinator:   I have insurance authorization and a bed available for pt to admit to CIR today.  Dr. Irene Limbo has cleared pt to admit.  Will let pt/family, and CM know.   Estill Dooms, PT, DPT Admissions Coordinator [phone] 04/17/19  9:43 AM

## 2019-04-18 ENCOUNTER — Inpatient Hospital Stay (HOSPITAL_COMMUNITY): Payer: BC Managed Care – PPO | Admitting: Physical Therapy

## 2019-04-18 ENCOUNTER — Inpatient Hospital Stay (HOSPITAL_COMMUNITY): Payer: BC Managed Care – PPO | Admitting: Occupational Therapy

## 2019-04-18 DIAGNOSIS — N319 Neuromuscular dysfunction of bladder, unspecified: Secondary | ICD-10-CM | POA: Diagnosis present

## 2019-04-18 LAB — CBC WITH DIFFERENTIAL/PLATELET
Abs Immature Granulocytes: 0.11 10*3/uL — ABNORMAL HIGH (ref 0.00–0.07)
Basophils Absolute: 0 10*3/uL (ref 0.0–0.1)
Basophils Relative: 0 %
Eosinophils Absolute: 0.3 10*3/uL (ref 0.0–0.5)
Eosinophils Relative: 4 %
HCT: 46.9 % — ABNORMAL HIGH (ref 36.0–46.0)
Hemoglobin: 16.4 g/dL — ABNORMAL HIGH (ref 12.0–15.0)
Immature Granulocytes: 1 %
Lymphocytes Relative: 42 %
Lymphs Abs: 3.8 10*3/uL (ref 0.7–4.0)
MCH: 29.3 pg (ref 26.0–34.0)
MCHC: 35 g/dL (ref 30.0–36.0)
MCV: 83.8 fL (ref 80.0–100.0)
Monocytes Absolute: 1 10*3/uL (ref 0.1–1.0)
Monocytes Relative: 11 %
Neutro Abs: 3.8 10*3/uL (ref 1.7–7.7)
Neutrophils Relative %: 42 %
Platelets: 278 10*3/uL (ref 150–400)
RBC: 5.6 MIL/uL — ABNORMAL HIGH (ref 3.87–5.11)
RDW: 11.9 % (ref 11.5–15.5)
WBC: 9 10*3/uL (ref 4.0–10.5)
nRBC: 0 % (ref 0.0–0.2)

## 2019-04-18 LAB — COMPREHENSIVE METABOLIC PANEL
ALT: 43 U/L (ref 0–44)
AST: 30 U/L (ref 15–41)
Albumin: 3.5 g/dL (ref 3.5–5.0)
Alkaline Phosphatase: 51 U/L (ref 38–126)
Anion gap: 9 (ref 5–15)
BUN: 15 mg/dL (ref 6–20)
CO2: 26 mmol/L (ref 22–32)
Calcium: 9.2 mg/dL (ref 8.9–10.3)
Chloride: 102 mmol/L (ref 98–111)
Creatinine, Ser: 0.92 mg/dL (ref 0.44–1.00)
GFR calc Af Amer: >60 mL/min (ref >60)
GFR calc non Af Amer: >60 mL/min (ref >60)
Glucose, Bld: 81 mg/dL (ref 70–99)
Potassium: 5.5 mmol/L — ABNORMAL HIGH (ref 3.5–5.1)
Sodium: 137 mmol/L (ref 135–145)
Total Bilirubin: 1.1 mg/dL (ref 0.3–1.2)
Total Protein: 6 g/dL — ABNORMAL LOW (ref 6.5–8.1)

## 2019-04-18 MED ORDER — BISACODYL 10 MG RE SUPP: 10.0000 mg | Freq: Every day | RECTAL | Status: DC | PRN

## 2019-04-18 NOTE — Patient Care Conference (Signed)
Inpatient RehabilitationTeam Conference and Plan of Care Update Date: 04/18/2019   Time: 11:20 AM    Patient Name: Jean Carlson      Medical Record Number: [id number]  Date of Birth: 2000/02/15 Sex: Female         Room/Bed: 4W09C/4W09C-01 Payor Info: Payor: BLUE CROSS BLUE SHIELD / Plan: BCBS COMM PPO / Product Type: *No Product type* /    Admit Date/Time:  04/17/2019  4:13 PM  Primary Diagnosis:  Demyelinating disorder [facility])  Patient Active Problem List   Diagnosis Date Noted  . Neurogenic bladder 04/18/2019  . Demyelinating disorder (HCC) 04/12/2019  . ADHD 04/12/2019  . MS (multiple sclerosis) (HCC) 04/10/2019  . Tobacco abuse 04/10/2019    Expected Discharge Date: Expected Discharge Date: 04/24/19  Team Members Present: Physician leading conference: Dr. Genice Rouge Care Coodinator Present: Cecile Sheerer, LCSWA;Genie Kalina Morabito, RN, MSN Nurse Present: Willey Blade, RN PT Present: Peter Congo, PT OT Present: Roney Mans, OT PPS Coordinator present : Fae Pippin, [place], PT     Current Status/Progress Goal Weekly Team Focus  Bowel/Bladder   Continent B/B, LBM  3/21  remain continent  PRN/QS toileting assessment   Swallow/Nutrition/ Hydration             ADL's   S-CGA ADLs/transfers  MOD I  insight into defiicts, balance, transfers, ADL retraining, DME recs   Mobility   CGA overall with transfers and gait 150 ft (+), min A stairs with 2 handrails  Supervision to mod I  PT eval   Communication             Safety/Cognition/ Behavioral Observations            Pain   No c/o or s&s of pain, has prn tylenol  No c/o pain  PRN/QS pain assessment   Skin   Skin intact no evidence of breakdown  Maintain skin integrity  PRN/QS skin assessment    Rehab Goals Patient on target to meet rehab goals: Yes *See Care Plan and progress notes for long and short-term goals.     Barriers to Discharge  Current Status/Progress Possible Resolutions Date Resolved    Nursing  Inaccessible home environment;Incontinence;Decreased caregiver support;Lack of/limited family support;Home environment access/layout;Medication compliance               PT                    OT                  SLP                SW                Discharge Planning/Teaching Needs:  Pt plans to discharge home with grandmother and parents on next week.  Family education reccommended and set for this upcoming Saturday. Grandmother, father and mother informed.   Team Discussion: MD 19 yo ADHD, weakness, double vision, MS, constipated, urine dribbles, hyperkalemia, monitoring labs.  RN A+O, needs MS education.  OT CGA no device, did some MS teaching, provided resources.  PT CGA 150' no device, min A stairs, ataxic.  Fam ed on Saturday with grandmother and home with Gmother at DC.   Revisions to Treatment Plan: N/A     Medical Summary Current Status: Ox3; gave education Weekly Focus/Goal: OT- CGA without RW; a lot of weight on heels- less sensation L leg;  short LOS  Barriers to Discharge: Behavior;Decreased  family/caregiver support;Incontinence;Neurogenic Bowel & Bladder;Medical stability;Home enviroment access/layout;Other (comments)  Barriers to Discharge Comments: new MS diagnosis Possible Resolutions to Barriers: PT- eval today; CGA without RW- 150 ft no AD; ataxic at times; poor control of LLE-   Continued Need for Acute Rehabilitation Level of Care: The patient requires daily medical management by a physician with specialized training in physical medicine and rehabilitation for the following reasons: Direction of a multidisciplinary physical rehabilitation program to maximize functional independence : Yes Medical management of patient stability for increased activity during participation in an intensive rehabilitation regime.: Yes Analysis of laboratory values and/or radiology reports with any subsequent need for medication adjustment and/or medical intervention. :  Yes   I attest that I was present, lead the team conference, and concur with the assessment and plan of the team.   Trish Mage 04/20/2019, 10:48 AM   Team conference was held via web/ teleconference due to COVID - 19

## 2019-04-18 NOTE — Evaluation (Signed)
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 Neurogenic bladder 04/18/2019  . Demyelinating disorder ([place]) 04/12/2019  . ADHD 04/12/2019  . MS (multiple sclerosis) ([place]) 04/10/2019  . Tobacco abuse 04/10/2019    Past Medical History:  Past Medical History:  Diagnosis Date  . ADHD    Past Surgical History: History reviewed. No pertinent surgical history.  Assessment & Plan Clinical Impression: Patient is a 19 y.o. year old female right-handed female with history of ADHD maintained on Concerta and tobacco abuse. Per chart review lives with her grandmother. 1 level home. Presented 04/09/2019 with double vision and numbness as well as weakness to the left side that progressed over the last 3 weeks. 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 Multiple lesions within the brainstem. No hemorrhage midline shift or hydrocephalus. No cervical spinal canal stenosis. Admission chemistries unremarkable. Patient completed a 5-day course of Solu-Medrol. Patient transferred to CIR on 04/17/2019 .    Patient currently requires min with basic self-care skills and basic mobility  secondary to muscle weakness, decreased cardiorespiratoy endurance, decreased coordination and decreased standing balance and decreased balance strategies.  Prior to hospitalization, patient could complete ADL with independent .  Patient will benefit from skilled intervention to  decrease level of assist with basic self-care skills and increase independence with basic self-care skills prior to discharge home with care partner.  Anticipate patient will require intermittent supervision and follow up outpatient.  OT - End of Session Activity Tolerance: Tolerates 30+ min activity with multiple rests Endurance Deficit: Yes Endurance Deficit Description: frequent rest breaks during ambulation throughout session OT Assessment Rehab Potential (ACUTE ONLY): Good OT Patient demonstrates impairments in the following area(s): Balance;Perception;Safety;Endurance;Motor;Sensory OT Basic ADL's Functional Problem(s): Grooming;Bathing;Dressing;Toileting OT Advanced ADL's Functional Problem(s): Simple Meal Preparation OT Transfers Functional Problem(s): Toilet;Tub/Shower OT Additional Impairment(s): Fuctional Use of Upper Extremity OT Plan OT Intensity: Minimum of 1-2 x/day, 45 to 90 minutes OT Frequency: 5 out of 7 days OT Duration/Estimated Length of Stay: ~7-10 days OT Treatment/Interventions: Balance/vestibular training;Discharge planning;Pain management;Self Care/advanced ADL retraining;Therapeutic Activities;UE/LE Coordination activities;Functional mobility training;Patient/family education;Therapeutic Exercise;Visual/perceptual remediation/compensation;Community reintegration;DME/adaptive equipment instruction;Neuromuscular re-education;Psychosocial support;UE/LE Strength taining/ROM OT Self Feeding Anticipated Outcome(s): n/a OT Basic Self-Care Anticipated Outcome(s): mod I OT Toileting Anticipated Outcome(s): mod I OT Bathroom Transfers Anticipated Outcome(s): mod I OT Recommendation Recommendations for Other Services: Neuropsych consult Patient destination: Home Follow Up Recommendations: Outpatient OT Equipment Recommended: To be determined   Skilled Therapeutic Intervention 1:! Eval initiated with OT purpose, role and goals dicussed. Pt able to perform bed mobility  without assistance. Pt ambulated to the bathroom with supervision with RW. Performed toileting and toilet transfer and transfer into shower with contact guard. Pt required contact guard for standing in there shower. Pt reports poor sensation in left foot and requires more time to feet secure on her feet before ambulating after standing up. Pt with decr coordination in left hand but continues to utilize with all ADL tasks including caring for her long hair. Pt able to dress sit to stand  with contact guard. Did discuss some of her incontinent issues and did follow up with her MD. Pt also presents with diplopia in right field.  Also provided information from the [facility] clinic and the MS society. Pt left resting in the bed with her things and her call bell.  OT Evaluation Precautions/Restrictions  Precautions Precautions: Fall Precaution Comments: diplopia Restrictions Weight Bearing Restrictions: No General Chart Reviewed: Yes Family/Caregiver Present: No    Pain  no c/o pain in session  Home Living/Prior [place] expects to be discharged to:: Private residence Living Arrangements: Parent Available Help at Discharge: Family, Available 24 hours/day Type of Home: House Home Access: Stairs to enter Technical brewer of Steps: 4 Entrance Stairs-Rails: None Home Layout: One level Bathroom Shower/Tub: Tub/shower unit, Architectural technologist: Standard Additional Comments: ramp at back door, then 4 STE with no rails  Lives With: Family Prior Function Level of Independence: Independent with gait, Independent with transfers, Requires assistive device for independence  Able to Take Stairs?: Yes Driving: Yes Vocation: Student Leisure: Hobbies-yes (Comment)(softball) ADL ADL Eating: Independent Upper Body Bathing: Setup Where Assessed-Upper Body Bathing: Shower Lower Body Bathing: Contact guard Where Assessed-Lower Body Bathing: Shower Upper Body Dressing:  Setup Where Assessed-Upper Body Dressing: Edge of bed Lower Body Dressing: Contact guard Where Assessed-Lower Body Dressing: Edge of bed Toilet Transfer: Minimal assistance Toilet Transfer Method: Magazine features editor: Curator Method: Ambulating Vision wears glasses all times Eye Alignment: Impaired (comment) Ocular Range of Motion: Other (comment) Alignment/Gaze Preference: Within Defined Limits Convergence: Within functional limits Diplopia Assessment: Only with right gaze Perception  Perception: Within Functional Limits Praxis Praxis: Intact Cognition Overall Cognitive Status: Within Functional Limits for tasks assessed Arousal/Alertness: Awake/alert Orientation Level: Person;Situation;Place Person: Oriented Place: Oriented Situation: Oriented Year: 2021 Month: March Day of Week: Correct Immediate Memory Recall: Sock;Blue;Bed Memory Recall Sock: Without Cue Memory Recall Blue: Without Cue Memory Recall Bed: Without Cue Attention: Selective Selective Attention: Appears intact Awareness: Appears intact Problem Solving: Appears intact Safety/Judgment: Appears intact Sensation Sensation Light Touch: Impaired Detail Light Touch Impaired Details: Impaired LUE;Impaired LLE Proprioception: Impaired Detail Proprioception Impaired Details: Impaired LUE;Impaired LLE Coordination Gross Motor Movements are Fluid and Coordinated: No Fine Motor Movements are Fluid and Coordinated: No Motor  Motor Motor: Hemiplegia Mobility  Transfers Sit to Stand: Supervision/Verbal cueing Stand to Sit: Supervision/Verbal cueing  Trunk/Postural Assessment  Cervical Assessment Cervical Assessment: Within Functional Limits Thoracic Assessment Thoracic Assessment: Within Functional Limits Lumbar Assessment Lumbar Assessment: Within Functional Limits Postural Control Postural Control: Deficits on evaluation Protective Responses: delayed   Balance Balance Balance Assessed: Yes Standardized Balance Assessment Standardized Balance Assessment: Berg Balance Test Berg Balance Test Sit to Stand: Able to stand without using hands and stabilize independently Standing Unsupported: Able to stand 2 minutes with supervision Sitting with Back Unsupported but Feet Supported on Floor or Stool: Able to sit safely and securely 2 minutes Stand to Sit: Sits safely with minimal use of hands Transfers: Able to transfer safely, minor use of hands Standing Unsupported with Eyes Closed: Able to stand 10 seconds with supervision Standing Ubsupported with Feet Together: Able to place feet together independently and stand for 1 minute with supervision From Standing, Reach Forward with Outstretched Arm: Reaches forward but needs supervision From Standing Position, Pick up Object from Floor: Able to pick up shoe, needs supervision From Standing Position, Turn to Look Behind Over each Shoulder: Needs supervision when turning Turn 360 Degrees: Needs close supervision or verbal cueing  Standing Unsupported, Alternately Place Feet on Step/Stool: Able to complete >2 steps/needs minimal assist Standing Unsupported, One Foot in Front: Needs help to step but can hold 15 seconds Standing on One Leg: Tries to lift leg/unable to hold 3 seconds but remains standing independently Total Score: 34 Static Sitting Balance Static Sitting - Balance Support: No upper extremity supported;Feet supported Static Sitting - Level of Assistance: 6: Modified independent (Device/Increase time) Dynamic Sitting Balance Dynamic Sitting - Balance Support: During functional activity Dynamic Sitting - Level of Assistance: 5: Stand by assistance Static Standing Balance Static Standing - Balance Support: During functional activity Static Standing - Level of Assistance: 5: Stand by assistance Dynamic Standing Balance Dynamic Standing - Balance Support: During functional  activity Dynamic Standing - Level of Assistance: 4: Min assist Extremity/Trunk Assessment RUE Assessment RUE Assessment: Within Functional Limits LUE Assessment Active Range of Motion (AROM) Comments: ROM WFL General Strength Comments: decr coordination - apraxia like but with time pt able to incorporate it in all functional tasks without cuing     Refer to Care Plan for Long Term Goals  Recommendations for other services: Neuropsych   Discharge Criteria: Patient will be discharged from OT if patient refuses treatment 3 consecutive times without medical reason, if treatment goals not met, if there is a change in medical status, if patient makes no progress towards goals or if patient is discharged from hospital.  The above assessment, treatment plan, treatment alternatives and goals were discussed and mutually agreed upon: by patient  Nicoletta Ba 04/18/2019, 11:02 AM

## 2019-04-18 NOTE — Progress Notes (Signed)
Social Work Patient ID: Jean Carlson, female   DOB: [birth year], 19 y.o.   MRN: [id number]  Patient set up with [facility]. Sessions set for Thursday, April 1st. Faxing referral.

## 2019-04-18 NOTE — Progress Notes (Signed)
Inpatient Rehabilitation  Patient information reviewed and entered into eRehab system by Norma Ignasiak M. Zaidin Blyden, M.A., CCC/SLP, PPS Coordinator.  Information including medical coding, functional ability and quality indicators will be reviewed and updated through discharge.    

## 2019-04-18 NOTE — Progress Notes (Signed)
Provided MS info to pt and grandmother, also copy for pt father. Has website [email or web address]. Encouraged her to sign up and will send out information and local resources that will be helpful after discharge.

## 2019-04-18 NOTE — Progress Notes (Addendum)
Physical Therapy Session Note  Patient Details  Name: Jean Carlson MRN: [id number] Date of Birth: [birth year]  Today's Date: 04/18/2019 PT Individual Time: 1328-1430 PT Individual Time Calculation (min): 62 min   Short Term Goals: Week 1:  PT Short Term Goal 1 (Week 1): =LTG due to ELOS  Skilled Therapeutic Interventions/Progress Updates:    Patient received in bed, fatigued but willing to work with PT today. Able to complete toileting and pericare with distant S today, then required min guard and cues to reduce speed and improve overall mechanics for gait in hallway, needed multiple standing rest breaks due to fatigue and muscle cramping this afternoon. Practiced gait along uneven surfaces without device and required MinA to maintain balance due to delayed balance responses and lateral unsteadiness. Otherwise focused on functional control and strengthening for L knee musculature and proximal strength with cues for appropriate exercise pacing and form with moderate quad and hamstring fatigue resulting. Also practiced exercises she can safely perform in her room including bridges with hip abduction to neutral with light orange TB, supine clamshells with light orange TB, and LAQs with dark orange TB sitting at EOB. Fatigued at [place].Discussed use of cooling vests for use of sports and activities given that she is looking forward to going back to playing soft ball.  Left in bed with all needs met and bed alarm active this afternoon.   Therapy Documentation Precautions:  Precautions Precautions: Fall Precaution Comments: diplopia Restrictions Weight Bearing Restrictions: No Pain: Pain Assessment Pain Scale: 0-10 Pain Score: 0-No pain    Therapy/Group: Individual Therapy   Windell Norfolk, DPT, PN1   Supplemental Physical Therapist [place]    Pager [phone] Acute Rehab Office [phone]    04/18/2019, 3:47 PM

## 2019-04-18 NOTE — Progress Notes (Signed)
Social Work Patient ID: Jean Carlson, female   DOB: 2000-02-16, 19 y.o.   MRN: [id number] SW went in to speak with pt and grandmother to update them from team conference. SW reviewed progress from therapy update. Sw provided tentative d/c date of 04/24/19 and let family know I would update them of any changes. Grandmother agreed to family education this upcoming Saturday 3/ 27/21. Grandmother would also like the father to attend on Saturday as well with mother via Facetime, if possible. Grandmother will be picking pt up on Monday for discharge. SW briefly spoke to pt and family about out patient rehab, will follow up when set. Pt has grab bars in bathroom: on side of sink, tub and wall. Had a [facility] and grandmother is requesting a shower chair.

## 2019-04-18 NOTE — Progress Notes (Signed)
[place] PHYSICAL MEDICINE & REHABILITATION PROGRESS NOTE   Subjective/Complaints:  Pt reports muscle spasms in neck occasionally- just over the last few weeks.   L hand and L foot feel weak and numb.   No pain except from constipation. Think it's from the diet. Receiving Bisacodyl PO tabs for  constipation.  According to OT, pt noted to her (not MD) not has had long standing dribbling issues with urine- wasn't clear how long.   ROS:  Pt denies SOB, abd pain, CP, N/V/C/D, and vision changes compared to before- still double vision when she looks R   Objective:   No results found. Recent Labs    04/18/19 0511  WBC 9.0  HGB 16.4*  HCT 46.9*  PLT 278   Recent Labs    04/18/19 0511  NA 137  K 5.5*  CL 102  CO2 26  GLUCOSE 81  BUN 15  CREATININE 0.92  CALCIUM 9.2   No intake or output data in the 24 hours ending 04/18/19 1018   Physical Exam: Vital Signs Blood pressure 106/62, pulse 86, temperature 98.3 F (36.8 C), temperature source Oral, resp. rate 18, height 5\' 4"  (1.626 m), weight 83.1 kg, SpO2 97 %.   Physical Exam Constitutional: pt awake, alert, sitting on EOB after shower, trying to get dressed, NAD  HENT:  Head:conjugate gaze noted when looking ahead; dysconjugate to R  Cardiovascular:RRR Respiratory:CTA B/L , but slightly distended, hypoactive BS  Musculoskeletal:  Cervical back: Normal range of motion.  Comments: No obvious pain left knee with AROM/PROM. No joint effusion Neurological: Ox3  Mild left PD. Strength 5/5 bilateral UE. RLE 5/5. LLE 4+/5. Decreased LT LLE, foot more affected than lower leg. Decreased HTS LLE. . Skin: Skin iswarmand dry. No signs of skin breakdown seen- wearing towel  Psychiatric: bubbly, but distracted    Assessment/Plan: 1. 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  Physiatrist is providing close team supervision and 24 hour management of active medical problems listed below.  Physiatrist and rehab team continue to assess barriers to discharge/monitor patient progress toward functional and medical goals  Care Tool:  Bathing              Bathing assist       Upper Body Dressing/Undressing Upper body dressing        Upper body assist Assist Level: Independent    Lower Body Dressing/Undressing Lower body dressing      What is the patient wearing?: Pants     Lower body assist Assist for lower body dressing: Contact Guard/Touching assist     Toileting Toileting    Toileting assist Assist for toileting: Contact Guard/Touching assist Assistive Device Comment: Walker   Transfers Chair/bed transfer  Transfers assist     Chair/bed transfer assist level: Supervision/Verbal cueing Chair/bed transfer assistive device: [id number]:[id number]   Ambulation assist              Walk 10 feet activity   Assist           Walk 50 feet activity   Assist           Walk 150 feet activity   Assist           Walk 10 feet on uneven surface  activity   Assist           Wheelchair     Assist  Wheelchair 50 feet with 2 turns activity    Assist            Wheelchair 150 feet activity     Assist          Blood pressure 106/62, pulse 86, temperature 98.3 F (36.8 C), temperature source Oral, resp. rate 18, height 5\' 4"  (1.626 m), weight 83.1 kg, SpO2 97 %.  Medical Problem List and Plan: 1.Decreased functional mobilitysecondary to demyelinating disorder/multiple sclerosis.IV Solu-Medrol completed. Patient to follow-up Dr. Felecia Shelling at the outpatient neurologic center -patient may shower -ELOS/Goals: 8-10 days, supervision to mod I 2. Antithrombotics: -DVT/anticoagulation:SCDs -antiplatelet therapy:  N/A 3. Pain Management:Tylenol as needed 4. Mood:Prozac 40 mg daily -antipsychotic agents: N/A 5. Neuropsych: This patientiscapable of making decisions on herown behalf. 6. Skin/Wound Care:Routine skin checks 7. Fluids/Electrolytes/Nutrition:Routine in and outs with follow-up chemistries 8. ADHD. Continue Concerta 30 mg daily 9. Tobacco abuse. Counseling 10. Hx of left pain after fall. Pt s/p MRI ordered by ortho on 04/07/19 which revealed bone contusion along the anterior aspect of lateral tibial plateau. 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 No ligament or meniscal tears.  -conservative care, pain relief, fall prevention/gait mechanics with PT -pt reports hx of left knee hyperextension (likely related to #1) during gait which predisposed to her pain/falls. -pt has knee brace at home given to her by ortho. Asked her to have it brought in. 11. Urinary urge incontinence with dribbling  3/23- will d/w pt further- might benefit from Myrbetriq. Don't want to try Oxybutynin since pt already has distractibility and don't want to cause cognitive issues. 12. Hyperkalemia  3/23- was repleted from 3.4- now 5.5- will see if will come back down since is due to repletion.  13. Constipation- neurogenic bowel - will con't Dulcolax pills and watch/monitor     LOS: 1 days A FACE TO FACE EVALUATION WAS PERFORMED  Sherrye Puga 04/18/2019, 10:18 AM

## 2019-04-18 NOTE — Evaluation (Signed)
Physical Therapy Assessment and Plan  Patient Details  Name: Jean Carlson MRN: [id number] Date of Birth: 2000-11-03  PT Diagnosis: Abnormal posture, Abnormality of gait, Ataxic gait, Hemiparesis non-dominant, Impaired sensation and Muscle weakness Rehab Potential: Good ELOS: 5-7 days   Today's Date: 04/18/2019 PT Individual Time: 1000-1100 PT Individual Time Calculation (min): 60 min    Problem List:  Patient Active Problem List   Diagnosis Date Noted  . Neurogenic bladder 04/18/2019  . Demyelinating disorder ([place]) 04/12/2019  . ADHD 04/12/2019  . MS (multiple sclerosis) ([place]) 04/10/2019  . Tobacco abuse 04/10/2019    Past Medical History:  Past Medical History:  Diagnosis Date  . ADHD    Past Surgical History: History reviewed. No pertinent surgical history.  Assessment & Plan Clinical Impression:  Jean Carlson is a 19 year old right-handed female with history of ADHD maintained on Concerta and tobacco abuse. Per chart review lives with her grandmother. 1 level home. Presented 04/09/2019 with double vision and numbness as well as weakness to the left side that progressed over the last 3 weeks. 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 Multiple lesions within the brainstem. No hemorrhage midline shift or hydrocephalus. No cervical spinal canal stenosis. Admission chemistries unremarkable. Patient completed a 5-day course of Solu-Medrol. Therapy evaluations completed and patient was admitted for a comprehensive rehab program. Patient transferred to CIR on 04/17/2019 .   Patient currently requires min with mobility secondary to muscle weakness, decreased cardiorespiratoy endurance, abnormal tone, unbalanced muscle activation and ataxia and decreased sitting balance, decreased standing balance, decreased postural  control, hemiplegia and decreased balance strategies.  Prior to hospitalization, patient was independent  with mobility and lived with Family in a House home.  Home access is 4Stairs to enter.  Patient will benefit from skilled PT intervention to maximize safe functional mobility, minimize fall risk and decrease caregiver burden for planned discharge home with 24 hour supervision.  Anticipate patient will benefit from follow up OP at discharge.  PT - End of Session Activity Tolerance: Tolerates 30+ min activity with multiple rests Endurance Deficit: Yes Endurance Deficit Description: frequent rest breaks during ambulation throughout session PT Assessment Rehab Potential (ACUTE/IP ONLY): Good PT Patient demonstrates impairments in the following area(s): Balance;Endurance;Motor;Safety;Sensory PT Transfers Functional Problem(s): Bed Mobility;Bed to Chair;Car;Furniture;Floor PT Locomotion Functional Problem(s): Ambulation;Wheelchair Mobility;Stairs PT Plan PT Intensity: Minimum of 1-2 x/day ,45 to 90 minutes PT Frequency: 5 out of 7 days PT Duration Estimated Length of Stay: 5-7 days PT Treatment/Interventions: Ambulation/gait training;Balance/vestibular training;Community reintegration;Discharge planning;Disease management/prevention;Functional mobility training;Neuromuscular re-education;Patient/family education;Psychosocial support;Stair training;Therapeutic Activities;Therapeutic Exercise;UE/LE Strength taining/ROM;UE/LE Coordination activities PT Transfers Anticipated Outcome(s): mod I PT Locomotion Anticipated Outcome(s): mod I with LRAD PT Recommendation Follow Up Recommendations: Outpatient PT Patient destination: Home Equipment Recommended: To be determined Equipment Details: TBD pending progress, none recommended at this time  Skilled Therapeutic Intervention Evaluation completed (see details above and below) with education on PT POC and goals and individual treatment initiated with  focus on functional transfer, gait, and balance assessment. Pt received seated EOB, agreeable to PT session. No complaints of pain. Pt able to perform bed mobility independently. Sit to stand with close Supervision to CGA throughout session with use of AD. Ambulation up to 200 ft with no AD at CGA level. Pt exhibits ataxic gait pattern with decreased L knee control with ongoing L knee hyperextension and L ankle instability. Ascend/descend 3 x 8"  stairs with 2 handrails and min A then progresses to ascending/descending 12 x 6" stairs with 2 handrails and min A with step-to gait pattern. Car transfer with CGA and verbal cues for safe transfer technique. Patient demonstrates increased fall risk as noted by score of  34/56 on Berg Balance Scale.  (<36= high risk for falls, close to 100%; 37-45 significant >80%; 46-51 moderate >50%; 52-55 lower >25%). Pt at a high risk for falls, reviewed balance test score and functional implications. Pt left seated in bed with needs in reach at end of session.  PT Evaluation Precautions/Restrictions Precautions Precautions: Fall Precaution Comments: diplopia Restrictions Weight Bearing Restrictions: No Home Living/Prior Functioning Home Living Available Help at Discharge: Family;Available 24 hours/day Type of Home: House Home Access: Stairs to enter [employer] of Steps: 4 Entrance Stairs-Rails: None Home Layout: One level Additional Comments: ramp at back door, then 4 STE with no rails  Lives With: Family Prior Function Level of Independence: Independent with gait;Independent with transfers;Requires assistive device for independence  Able to Take Stairs?: Yes Driving: Yes Vocation: Student Leisure: Hobbies-yes (Comment)(softball) Vision/Perception  Vision - History Patient Visual Report: Diplopia Vision - Assessment Ocular Range of Motion: Impaired-to be further tested in functional context Perception Perception: Within Functional  Limits Praxis Praxis: Intact  Cognition Overall Cognitive Status: Within Functional Limits for tasks assessed Arousal/Alertness: Awake/alert Orientation Level: Oriented X4 Attention: Selective Selective Attention: Appears intact Memory: Appears intact Awareness: Appears intact Problem Solving: Appears intact Safety/Judgment: Appears intact Sensation Sensation Light Touch: Impaired Detail Light Touch Impaired Details: Impaired LUE;Impaired LLE Proprioception: Impaired Detail Proprioception Impaired Details: Impaired LUE;Impaired LLE Coordination Gross Motor Movements are Fluid and Coordinated: No Fine Motor Movements are Fluid and Coordinated: No Coordination and Movement Description: impaired LUE and LLE Motor  Motor Motor: Hemiplegia Motor - Skilled Clinical Observations: L hemi  Mobility Bed Mobility Bed Mobility: Rolling Right;Rolling Left;Supine to Sit;Sit to Supine Rolling Right: Independent Rolling Left: Independent Supine to Sit: Independent Sit to Supine: Independent Transfers Transfers: Sit to Stand;Stand to Sit;Stand Pivot Transfers Sit to Stand: Contact Guard/Touching assist Stand to Sit: Contact Guard/Touching assist Stand Pivot Transfers: Contact Guard/Touching assist Transfer (Assistive device): None Locomotion  Gait Gait Distance (Feet): 200 Feet Assistive device: None Gait Gait Pattern: Impaired(ataxic LLE, L knee hyperextension) Gait velocity: increased (needs cues for safety to slow down) Stairs / Additional Locomotion Stairs: Yes Stairs Assistance: Minimal Assistance - Patient > 75% Stair Management Technique: Two rails;Step to pattern Number of Stairs: 12 Height of Stairs: 6 Wheelchair Mobility Wheelchair Mobility: No  Trunk/Postural Assessment  Cervical Assessment Cervical Assessment: Within Functional Limits Thoracic Assessment Thoracic Assessment: Within Functional Limits Lumbar Assessment Lumbar Assessment: Within Functional  Limits Postural Control Postural Control: Deficits on evaluation Protective Responses: delayed  Balance Balance Balance Assessed: Yes Standardized Balance Assessment Standardized Balance Assessment: Berg Balance Test Berg Balance Test Sit to Stand: Able to stand without using hands and stabilize independently Standing Unsupported: Able to stand 2 minutes with supervision Sitting with Back Unsupported but Feet Supported on Floor or Stool: Able to sit safely and securely 2 minutes Stand to Sit: Sits safely with minimal use of hands Transfers: Able to transfer safely, minor use of hands Standing Unsupported with Eyes Closed: Able to stand 10 seconds with supervision Standing Ubsupported with Feet Together: Able to place feet together independently and stand for 1 minute with supervision From Standing, Reach Forward with Outstretched Arm: Reaches forward but needs supervision From Standing Position, Pick up Object from Floor: Able  to pick up shoe, needs supervision From Standing Position, Turn to Look Behind Over each Shoulder: Needs supervision when turning Turn 360 Degrees: Needs close supervision or verbal cueing Standing Unsupported, Alternately Place Feet on Step/Stool: Able to complete >2 steps/needs minimal assist Standing Unsupported, One Foot in Front: Needs help to step but can hold 15 seconds Standing on One Leg: Tries to lift leg/unable to hold 3 seconds but remains standing independently Total Score: 34 Static Sitting Balance Static Sitting - Balance Support: No upper extremity supported;Feet supported Static Sitting - Level of Assistance: 6: Modified independent (Device/Increase time) Dynamic Sitting Balance Dynamic Sitting - Balance Support: During functional activity Dynamic Sitting - Level of Assistance: 5: Stand by assistance Static Standing Balance Static Standing - Balance Support: During functional activity Static Standing - Level of Assistance: 5: Stand by  assistance Dynamic Standing Balance Dynamic Standing - Balance Support: During functional activity Dynamic Standing - Level of Assistance: 4: Min assist Extremity Assessment  RUE Assessment RUE Assessment: Within Functional Limits   RLE Assessment RLE Assessment: Within Functional Limits General Strength Comments: 5/5 grossly LLE Assessment LLE Assessment: Exceptions to [facility] General Strength Comments: 4+/5 grossly    Refer to Care Plan for Long Term Goals  Recommendations for other services: None   Discharge Criteria: Patient will be discharged from PT if patient refuses treatment 3 consecutive times without medical reason, if treatment goals not met, if there is a change in medical status, if patient makes no progress towards goals or if patient is discharged from hospital.  The above assessment, treatment plan, treatment alternatives and goals were discussed and mutually agreed upon: by patient   Excell Seltzer, PT, DPT 04/18/2019, 12:51 PM

## 2019-04-18 NOTE — Telephone Encounter (Signed)
Tried calling pt at [phone] . LVM for pt to call office to schedule appt.   Called and spoke with mother on [phone]. She states pt currently in rehab and unsure when she will be discharged. She requested that she call us back to make appt when she knows when she will be going home. She requested that we do not call pt. Advised I will let Dr. Epimenio Foot know.

## 2019-04-18 NOTE — Progress Notes (Signed)
Social Work Assessment and Plan   Patient Details  Name: Jean Carlson MRN: [id number] Date of Birth: 04/10/2000  Today's Date: 04/18/2019  Problem List:  Patient Active Problem List   Diagnosis Date Noted  . Neurogenic bladder 04/18/2019  . Demyelinating disorder ([place]) 04/12/2019  . ADHD 04/12/2019  . MS (multiple sclerosis) ([place]) 04/10/2019  . Tobacco abuse 04/10/2019   Past Medical History:  Past Medical History:  Diagnosis Date  . ADHD    Past Surgical History: History reviewed. No pertinent surgical history. Social History:  reports that she has been smoking. She has never used smokeless tobacco. She reports previous alcohol use. She reports that she does not use drugs.  Family / Support Systems Marital Status: Single Patient Roles: Other (Comment) Spouse/Significant Other: None Children: None Other Supports: Grandmother, Father, Mother Anticipated Caregiver: Grandmother primarily. Dad truck driver locally and able to assit at nights. Mother truck Art gallery manager and taking leave to assit at d/c Ability/Limitations of Caregiver: N/A Caregiver Availability: 24/7 Family Dynamics: Pt lives and raised with grandma due to parents truck driving job.Parent still very active in pt care  Social History Preferred language: English Religion:  Cultural Background: Pt does not work. attends school online and bike rides for leisure, plans to start back in May. Education: Pt attending college currently Read: Yes Write: Yes Employment Status: Unemployed Date Retired/Disabled/Unemployed: N/A Public relations account executive Issues: N/A Guardian/Conservator: N/A   Abuse/Neglect Abuse/Neglect Assessment Can Be Completed: Yes Physical Abuse: Denies Verbal Abuse: Denies Sexual Abuse: Denies Exploitation of patient/patient's resources: Denies Self-Neglect: Denies  Emotional Status Pt's affect, behavior and adjustment status: Pt is feeling better emotionally due to starting  back her depression/anxiety medications. Pt stopped taking medications for a short period of time. Recent Psychosocial Issues: No Psychiatric History: Pt has been on her medications since she was about age 19 Substance Abuse History: No  Patient / Family Perceptions, Expectations & Goals Pt/Family understanding of illness & functional limitations: Yes, patient is understandig of her limitations. Pt has hard time walking and following her safety cues Premorbid pt/family roles/activities: N/A Anticipated changes in roles/activities/participation: N/A Pt/family expectations/goals: pt goal is to d/c home  [employer]: None Premorbid Home Care/DME Agencies: None Transportation available at discharge: Grandmother and father to pick pt up at d/c.  Discharge Planning Living Arrangements: Parent, Other (Comment)(Lives with grandmother and parents) Support Systems: Parent, Other (Comment)(Grandmother. Family education set for 04/22/19) Type of Residence: Private residence Insurance Resources: Multimedia programmer (specify)(BCBS) Financial Resources: Family Support Financial Screen Referred: Yes Money Management: Patient Does the patient have any problems obtaining your medications?: No Social Work Anticipated Follow Up Needs: HH/OP Expected length of stay: Pt set to discharge 04/24/19  Clinical Impression SW met with resident to introduce self, explain role and discuss d/c process. Pt reported DME: pt has grab bars on side of toilet, wall, and tub in bathroom. Pt has BSC. Will need shower chair.  Pt is not a English as a second language teacher.    Dyanne Iha 04/18/2019, 2:33 PM

## 2019-04-19 ENCOUNTER — Inpatient Hospital Stay (HOSPITAL_COMMUNITY): Payer: BC Managed Care – PPO | Admitting: Physical Therapy

## 2019-04-19 ENCOUNTER — Inpatient Hospital Stay (HOSPITAL_COMMUNITY): Payer: BC Managed Care – PPO

## 2019-04-19 NOTE — Progress Notes (Signed)
[place] PHYSICAL MEDICINE & REHABILITATION PROGRESS NOTE   Subjective/Complaints:  Pt reports more about bladder issues- has had since age 19 or so- was potty trained at age 77, so never quite been perfect on bladder- Asking for exercises- pelvic floor exercises, not meds.   Also co nausea this AM- took meds without food this AM-   Specifically asking when will be able to start doing softball season and play competitively.   ROS:   Pt denies SOB, abd pain, CP, N/V/C/D, and vision changes Double vision when looks Right.  Objective:   No results found. Recent Labs    04/18/19 0511  WBC 9.0  HGB 16.4*  HCT 46.9*  PLT 278   Recent Labs    04/18/19 0511  NA 137  K 5.5*  CL 102  CO2 26  GLUCOSE 81  BUN 15  CREATININE 0.92  CALCIUM 9.2    Intake/Output Summary (Last 24 hours) at 04/19/2019 1008 Last data filed at 04/18/2019 1849 Gross per 24 hour  Intake 780 ml  Output --  Net 780 ml     Physical Exam: Vital Signs Blood pressure 105/60, pulse 86, temperature 98.1 F (36.7 C), temperature source Oral, resp. rate 16, height 5\' 4"  (1.626 m), weight 83.1 kg, SpO2 99 %.   Physical Exam Constitutional: pt supine, in bed; appropriate,  HENT:  Head:dysconjugate to Right only Cardiovascular:RRR no JVD Respiratory:CTA B/L- good air movement , NT, ND, (+)BS Musculoskeletal:  Cervical back: Normal range of motion.  Comments: No obvious pain left knee with AROM/PROM. No joint effusion Neurological: Ox3- appropriate, but distracted, tangential  Mild left PD. Strength 5/5 bilateral UE. RLE 5/5. LLE 4+/5. Decreased LT LLE, foot more affected than lower leg. Decreased HTS LLE. . Skin: Skin iswarmand dry.  Psychiatric: bouncy by tangential    Assessment/Plan: 1. 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  Physiatrist is  providing close team supervision and 24 hour management of active medical problems listed below.  Physiatrist and rehab team continue to assess barriers to discharge/monitor patient progress toward functional and medical goals  Care Tool:  Bathing        Body parts bathed by helper: Right arm, Left upper leg, Right lower leg, Chest, Left arm, Abdomen, Front perineal area, Buttocks, Right upper leg, Face, Left lower leg     Bathing assist Assist Level: Contact Guard/Touching assist     Upper Body Dressing/Undressing Upper body dressing   What is the patient wearing?: Pull over shirt    Upper body assist Assist Level: Set up assist    Lower Body Dressing/Undressing Lower body dressing      What is the patient wearing?: Pants, Underwear/pull up     Lower body assist Assist for lower body dressing: Contact Guard/Touching assist     Toileting Toileting    Toileting assist Assist for toileting: Contact Guard/Touching assist Assistive Device Comment: Walker   Transfers Chair/bed transfer  Transfers assist     Chair/bed transfer assist level: Contact Guard/Touching assist Chair/bed transfer assistive device: [id number]   Ambulation assist      Assist level: Contact Guard/Touching assist Assistive device: No Device Max distance: 200'   Walk 10 feet activity   Assist     Assist level: Contact Guard/Touching assist Assistive device: No Device   Walk 50 feet activity   Assist    Assist level: Contact Guard/Touching  assist Assistive device: No Device    Walk 150 feet activity   Assist    Assist level: Contact Guard/Touching assist Assistive device: No Device    Walk 10 feet on uneven surface  activity   Assist Walk 10 feet on uneven surfaces activity did not occur: Safety/medical concerns   Assist level: Minimal Assistance - Patient > 75% Assistive device: Other (comment)(no device)   Wheelchair     Assist  Will patient use wheelchair at discharge?: No             Wheelchair 50 feet with 2 turns activity    Assist            Wheelchair 150 feet activity     Assist          Blood pressure 105/60, pulse 86, temperature 98.1 F (36.7 C), temperature source Oral, resp. rate 16, height 5\' 4"  (1.626 m), weight 83.1 kg, SpO2 99 %.  Medical Problem List and Plan: 1.Decreased functional mobilitysecondary to demyelinating disorder/multiple sclerosis.IV Solu-Medrol completed. Patient to follow-up Dr. Felecia Shelling at the outpatient neurologic center -patient may shower  3/24- explained to pt goal is to focus on letting her learn to walk again without falling- not ready for softball esp to play in games for at least a few months. Pt unhappy;  -ELOS/Goals: 8-10 days, supervision to mod I 2. Antithrombotics: -DVT/anticoagulation:SCDs -antiplatelet therapy: N/A 3. Pain Management:Tylenol as needed 4. Mood:Prozac 40 mg daily -antipsychotic agents: N/A 5. Neuropsych: This patientiscapable of making decisions on herown behalf. 6. Skin/Wound Care:Routine skin checks 7. Fluids/Electrolytes/Nutrition:Routine in and outs with follow-up chemistries 8. ADHD. Continue Concerta 30 mg daily 9. Tobacco abuse. Counseling 10. Hx of left pain after fall. Pt s/p MRI ordered by ortho on 04/07/19 which revealed bone contusion along the anterior aspect of lateral tibial plateau. 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 No ligament or meniscal tears.  -conservative care, pain relief, fall prevention/gait mechanics with PT -pt reports hx of left knee hyperextension (likely related to #1) during gait which predisposed to her pain/falls. -pt has knee brace at home given to her by ortho. Asked her to have it brought in. 11. Urinary urge incontinence with dribbling  3/23- will d/w pt further-  might benefit from Myrbetriq. Don't want to try Oxybutynin since pt already has distractibility and don't want to cause cognitive issues.  3/24- pt more interested in pelvic floor HEP- will send to pelvic floor specialist after d/c- explained if do exercises wrong, can make things worse.  12. Hyperkalemia  3/23- was repleted from 3.4- now 5.5- will see if will come back down since is due to repletion.  13. Constipation- neurogenic bowel - will con't Dulcolax pills and watch/monitor  14. MS- 3/24- pt asking if can drink while on MS meds- explained usually not, if in best case scenario, can drink ONE drink in 24 hours- pt unhappy about that-     LOS: 2 days A FACE TO FACE EVALUATION WAS PERFORMED  Jean Carlson 04/19/2019, 10:08 AM

## 2019-04-19 NOTE — Progress Notes (Signed)
Occupational Therapy Session Note  Patient Details  Name: Jean Carlson MRN: [id number] Date of Birth: 25-Nov-2000  Today's Date: 04/19/2019 OT Individual Time: 4193-7902 OT Individual Time Calculation (min): 54 min    Short Term Goals: Week 1:  OT Short Term Goal 1 (Week 1): LTG=STG  Skilled Therapeutic Interventions/Progress Updates:    1:1. Pt received in bed awake requesting to begin breakfast. Pt sits cross legged in bed with MOD I and discusses getting back to soft ball and upcoming schedule. Discussed overheating/safety concerns with knee instability/balance deficits. Edu re initially having runner at first game/going to field with family to practice walking on uneven ground to see how deficits impacting potential of playing. Pt reporting nausea from medications and requests to go to bathroom. Pt ambulates with no AD to bathroom with close guarding supervision and pt reaching out to furniture walk to bathroom. Pt with successful B&B movement. Pt able to completes hygeine with set up and ambuates back to bed with S after standing hygiene. Pt completes eating while OT prints out FMC, theraband and putty HEP for LUE strength/coordination outside of tx time. OT reviews all activities/pictures/handouts while pt eats breakfast. Exited session with pt seated in bed, exit alarm on and call light in reach  Therapy Documentation Precautions:  Precautions Precautions: Fall Precaution Comments: diplopia Restrictions Weight Bearing Restrictions: No General:   Vital Signs: Therapy Vitals Temp: 98.1 F (36.7 C) Temp Source: Oral Pulse Rate: 86 Resp: 16 BP: 105/60 Patient Position (if appropriate): Lying Oxygen Therapy SpO2: 99 % O2 Device: Room Air Pain:   ADL: ADL Eating: Independent Upper Body Bathing: Setup Where Assessed-Upper Body Bathing: Shower Lower Body Bathing: Contact guard Where Assessed-Lower Body Bathing: Shower Upper Body Dressing: Setup Where Assessed-Upper Body  Dressing: Edge of bed Lower Body Dressing: Contact guard Where Assessed-Lower Body Dressing: Edge of bed Toilet Transfer: Minimal assistance Toilet Transfer Method: Event organiser: Administrator, arts Method: Museum/gallery conservator    Praxis   Exercises:   Other Treatments:     Therapy/Group: Individual Therapy  Shon Hale 04/19/2019, 7:12 AM

## 2019-04-19 NOTE — Progress Notes (Signed)
Physical Therapy Session Note  Patient Details  Name: Jean Carlson MRN: [id number] Date of Birth: 2000/02/28  Today's Date: 04/19/2019 PT Individual Time: 1102-1157 PT Individual Time Calculation (min): 55 min   Short Term Goals: Week 1:  PT Short Term Goal 1 (Week 1): =LTG due to ELOS  Skilled Therapeutic Interventions/Progress Updates:    Patient received sitting in [facility], family present and willing to perform "bonus" session with therapy today. Continued gait training distances of approximately 153f with min guard and cues for improved BOS and control in stance phase, also cues for safe pace of gait especially when turning as she tends to speed walk if left to her own devices. Focused a good chunk of session on control and strengthening of  L knee with functional sports based stabilization tasks such as  including keeping L knee bent in mini-lunge while bouncing ball and mini squats with equal weight bearing while bouncing ball. Also worked on  [employer]with BLEs only at level 5 for 8 minutes. Otherwise worked on advanced balance based tasks including gait training over mat with folded towels hidden under it to create uneven surface, forward and backward tandem walking, forwards and backwards walking while bouncing ball, and holding semi-tandem position with knees slightly bent while rotating trunk with ball for improved core activation. Able to ambulate back to her room from PT gym and was left sitting on side of bed with all needs met, bed alarm active and family present.   Therapy Documentation Precautions:  Precautions Precautions: Fall Precaution Comments: diplopia Restrictions Weight Bearing Restrictions: No    Pain: Pain Assessment Pain Scale: 0-10 Pain Score: 0-No pain    Therapy/Group: Individual Therapy   KWindell Norfolk DPT, PN1   Supplemental Physical Therapist CPerrin   Pager [phone] Rehab Office [phone]   04/19/2019,  12:33 PM

## 2019-04-19 NOTE — Progress Notes (Signed)
Physical Therapy Session Note  Patient Details  Name: Jean Carlson MRN: [id number] Date of Birth: 03-Dec-2000  Today's Date: 04/19/2019 PT Individual Time: 0900-1000 PT Individual Time Calculation (min): 60 min   Short Term Goals: Week 1:  PT Short Term Goal 1 (Week 1): =LTG due to ELOS  Skilled Therapeutic Interventions/Progress Updates:    Pt received seated EOB, agreeable to PT session. No complaints of pain. Sit to stand with Supervision to CGA throughout session. Toilet transfer with Supervision, independent for pericare and clothing management. Ambulation to/from therapy gym with no AD and CGA, decreased LLE control during gait and some ataxia. Ambulation across uneven ground of mat and mat with horseshoes underneath for increased challenge at CGA level. Standing squats 2 x 15 reps on even ground with Supervision for balance, 2 x 15 reps on airex with CGA for balance with focus on BLE symmetry and LE strengthening. Alt L/R 4" step-ups forwards and laterally with min A for balance for BLE strengthening. Pt has onset of BLE spasms L>R with onset of fatigue that decrease at rest. Pt left seated in w/c in room with needs in reach, grandmother present at end of session.  Therapy Documentation Precautions:  Precautions Precautions: Fall Precaution Comments: diplopia Restrictions Weight Bearing Restrictions: No    Therapy/Group: Individual Therapy   Peter Congo, PT, DPT  04/19/2019, 4:50 PM

## 2019-04-19 NOTE — Progress Notes (Signed)
Social Work Patient ID: Jean Carlson, female   DOB: [birth year], 19 y.o.   MRN: [id number]  SW was informed by patient that grandmother may swap with mother for family education due to family dynamics. Grandmother reports her and the mother disagree a lot. Grandmother is willing to let mother come in for education if she is available on Saturday. Will follow up with grandmother today on who will be attending education officially.

## 2019-04-19 NOTE — Progress Notes (Signed)
Physical Therapy Session Note  Patient Details  Name: Jean Carlson MRN: [id number] Date of Birth: 05-01-2000  Today's Date: 04/19/2019 PT Individual Time: 1418-1530 PT Individual Time Calculation (min): 72 min   Short Term Goals: Week 1:  PT Short Term Goal 1 (Week 1): =LTG due to ELOS  Skilled Therapeutic Interventions/Progress Updates: Pt presented in bed with grandmother present agreeable to therapy. Pt stating some stomach discomfort but no pain. Pt looking for rehab binder on bed and noted behind HOB. Pt was able to get on floor and performed floor transfer to pick up papers. Pt ambulated to rehab gym with CGA and x 1 LOB when turning to L requiring minA for recovery. Pt participated in dynamic gait activities with use of Maxi Sky for safety. Pt participated in forward and diagonal lunges x 10 and "ice skaters" x 10. Pt required min cues for technique and 2 episodes of LOB laterally. Pt also noted to have occasional increased posterior bias with intermittent raising toes to correct balance. Pt then ambulated to day room and with CGA and x 1 episode of R lateral LOB when navigating around obstacle in hallway. Participated in Wii balance board activities including bubble ball, penguin game, and snowboarding. Pt was able to perform snowboarding in semi squat position to allow for increased quad recruitment with fair ankle strategy noted. Pt ambulated back to room at end of session CGA without LOB and returned to bed. Pt left sitting EOB with alarm on, call bell within reach and grandmother present.      Therapy Documentation Precautions:  Precautions Precautions: Fall Precaution Comments: diplopia Restrictions Weight Bearing Restrictions: No General:   Vital Signs: Therapy Vitals Temp: 98 F (36.7 C) Pulse Rate: (!) 105 Resp: 14 BP: 122/84 Patient Position (if appropriate): Sitting Oxygen Therapy SpO2: 99 % O2 Device: Room Air Pain: Pain Assessment Pain Scale: 0-10 Pain  Score: 0-No pain    Therapy/Group: Individual Therapy  Sharmin Foulk  Ofilia Rayon, PTA  04/19/2019, 3:45 PM

## 2019-04-20 ENCOUNTER — Inpatient Hospital Stay (HOSPITAL_COMMUNITY): Payer: BC Managed Care – PPO

## 2019-04-20 MED ORDER — METHYLPHENIDATE HCL ER (OSM) 18 MG PO TBCR
36.0000 mg | EXTENDED_RELEASE_TABLET | Freq: Every day | ORAL | Status: DC
  Administered 2019-04-21 – 2019-04-24 (×4): 36 mg via ORAL
  Filled 2019-04-20 (×4): qty 2

## 2019-04-20 NOTE — Progress Notes (Signed)
[place] PHYSICAL MEDICINE & REHABILITATION PROGRESS NOTE   Subjective/Complaints:  Pt reports mother wants to speak with SW about scheduling of therapy, doctor's appointments, etc- Explained usually don't get scheduled til d/c, but showed me appointment for April with outpt PT, which must have been put in while in acute hospital.    ROS:   Pt denies SOB, abd pain, CP, N/V/C/D, and vision changes Still double vision when looks right  Objective:   No results found. Recent Labs    04/18/19 0511  WBC 9.0  HGB 16.4*  HCT 46.9*  PLT 278   Recent Labs    04/18/19 0511  NA 137  K 5.5*  CL 102  CO2 26  GLUCOSE 81  BUN 15  CREATININE 0.92  CALCIUM 9.2    Intake/Output Summary (Last 24 hours) at 04/20/2019 4627 Last data filed at 04/20/2019 0800 Gross per 24 hour  Intake 680 ml  Output --  Net 680 ml     Physical Exam: Vital Signs Blood pressure 100/65, pulse 85, temperature 98.4 F (36.9 C), temperature source Oral, resp. rate 18, height 5\' 4"  (1.626 m), weight 83.1 kg, SpO2 97 %.   Physical Exam Constitutional: pt furniture walking around room; getting into shower, NAD; OT in room  HENT:  Head:conjugate except to R Cardiovascular:RRR Respiratory:CTA B/L- no accessory muscle use , NT, ND, (+)BS  Musculoskeletal:  Cervical back: Normal range of motion.  Comments: No obvious pain left knee with AROM/PROM. No joint effusion Neurological: Ox3- appropriate, but distracted, tangential  Mild left PD. Strength 5/5 bilateral UE. RLE 5/5. LLE 4+/5. Decreased LT LLE, foot more affected than lower leg. Decreased HTS LLE.  Hyperextending BL knees with gait-  Skin: Skin iswarmand dry.  Psychiatric: bouncy/bubbling, but still changes subjects frequently.     Assessment/Plan: 1. 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  Physiatrist is providing close team supervision and 24 hour management of active medical problems listed below.  Physiatrist and rehab team continue to assess barriers to discharge/monitor patient progress toward functional and medical goals  Care Tool:  Bathing        Body parts bathed by helper: Right arm, Left upper leg, Right lower leg, Chest, Left arm, Abdomen, Front perineal area, Buttocks, Right upper leg, Face, Left lower leg     Bathing assist Assist Level: Contact Guard/Touching assist     Upper Body Dressing/Undressing Upper body dressing   What is the patient wearing?: Pull over shirt    Upper body assist Assist Level: Set up assist    Lower Body Dressing/Undressing Lower body dressing      What is the patient wearing?: Pants, Underwear/pull up     Lower body assist Assist for lower body dressing: Contact Guard/Touching assist     Toileting Toileting    Toileting assist Assist for toileting: Contact Guard/Touching assist Assistive Device Comment: Walker   Transfers Chair/bed transfer  Transfers assist     Chair/bed transfer assist level: Supervision/Verbal cueing Chair/bed transfer assistive device: OJ:JKKX assist      Assist level: Contact Guard/Touching assist Assistive device: No Device Max distance: 18ft   Walk 10 feet activity   Assist     Assist level: Contact Guard/Touching assist Assistive device: No Device   Walk 50 feet activity   Assist    Assist level: Contact Guard/Touching assist Assistive device: No  Device    Walk 150 feet activity   Assist    Assist level: Contact Guard/Touching assist Assistive device: No Device    Walk 10 feet on uneven surface  activity   Assist Walk 10 feet on uneven surfaces activity did not occur: Safety/medical concerns   Assist level: Minimal Assistance - Patient > 75% Assistive device: Other (comment)(no device)    Wheelchair     Assist Will patient use wheelchair at discharge?: No             Wheelchair 50 feet with 2 turns activity    Assist            Wheelchair 150 feet activity     Assist          Blood pressure 100/65, pulse 85, temperature 98.4 F (36.9 C), temperature source Oral, resp. rate 18, height 5\' 4"  (1.626 m), weight 83.1 kg, SpO2 97 %.  Medical Problem List and Plan: 1.Decreased functional mobilitysecondary to demyelinating disorder/multiple sclerosis.IV Solu-Medrol completed. Patient to follow-up Dr. Felecia Shelling at the outpatient neurologic center -patient may shower  3/24- explained to pt goal is to focus on letting her learn to walk again without falling- not ready for softball esp to play in games for at least a few months. Pt unhappy;   3/25- asking to speak to SW -ELOS/Goals: 8-10 days, supervision to mod I 2. Antithrombotics: -DVT/anticoagulation:SCDs -antiplatelet therapy: N/A 3. Pain Management:Tylenol as needed 4. Mood:Prozac 40 mg daily -antipsychotic agents: N/A 5. Neuropsych: This patientiscapable of making decisions on herown behalf. 6. Skin/Wound Care:Routine skin checks 7. Fluids/Electrolytes/Nutrition:Routine in and outs with follow-up chemistries 8. ADHD. Continue Concerta 30 mg daily 9. Tobacco abuse. Counseling 10. Hx of left pain after fall. Pt s/p MRI ordered by ortho on 04/07/19 which revealed bone contusion along the anterior aspect of lateral tibial plateau. 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 No ligament or meniscal tears.  -conservative care, pain relief, fall prevention/gait mechanics with PT -pt reports hx of left knee hyperextension (likely related to #1) during gait which predisposed to her pain/falls. -pt has knee brace at home given to her by ortho. Asked her to have it brought in. 11. Urinary urge  incontinence with dribbling  3/23- will d/w pt further- might benefit from Myrbetriq. Don't want to try Oxybutynin since pt already has distractibility and don't want to cause cognitive issues.  3/24- pt more interested in pelvic floor HEP- will send to pelvic floor specialist after d/c- explained if do exercises wrong, can make things worse.  3/25- will speak to PT about any exercises until sees outpatient-  12. Hyperkalemia  3/23- was repleted from 3.4- now 5.5- will see if will come back down since is due to repletion.   3/25- labs in AM 13. Constipation- neurogenic bowel - will con't Dulcolax pills and watch/monitor  14. MS- 3/24- pt asking if can drink while on MS meds- explained usually not, if in best case scenario, can drink ONE drink in 24 hours- pt unhappy about that-     LOS: 3 days A FACE TO FACE EVALUATION WAS PERFORMED  Opel Lejeune 04/20/2019, 9:22 AM

## 2019-04-20 NOTE — Progress Notes (Signed)
Occupational Therapy Session Note  Patient Details  Name: Jean Carlson MRN: [id number] Date of Birth: [birth year]  Today's Date: 04/20/2019 OT Individual Time: 0800-0900 OT Individual Time Calculation (min): 60 min    Short Term Goals: Week 1:  OT Short Term Goal 1 (Week 1): LTG=STG  Skilled Therapeutic Interventions/Progress Updates:  Pt received supine in bed agreeable to OT intervention. Pt reports urgency to use bathroom upon OTA arrival. Pt completed bed mobility MOD I with use of rails and HOB slightly elevated. Pt completed functional mobility from EOB> bathroom with CGA needing light MIN A to initially steady self upon stand from EOB. Pt completed all toileting tasks with supervision. Pt able to retrieve clothes for showering from various surface levels with no AD or LOB with CGA for safety. Pt ambulated to shower in similar fashion where pt completed showering from shower seat with supervision. Pt completed seated UB/LB dressing with CGA from EOB. Pt completed standing grooming tasks with supervision. Pt continues to express interest in returning to softball league; discussed functional implications related to visual deficits with pt verbalizing understanding. Discussed options for shower seat at home with pt requested shower chair vs TTB.  Pt left seated in w/c with alarm belt activated and all needs within reach.  Therapy Documentation Precautions:  Precautions Precautions: Fall Precaution Comments: diplopia Restrictions Weight Bearing Restrictions: No General:   Vital Signs:   Pain: Pt reports no pain during session.   Therapy/Group: Individual Therapy    Angelina Pih 04/20/2019, 11:08 AM

## 2019-04-20 NOTE — Care Management (Signed)
[facility]patient Rehabilitation Center Individual Statement of Services  Patient Name:  Jean Carlson  Date:  04/20/2019  Welcome to the Inpatient Rehabilitation Center.  Our goal is to provide you with an individualized program based on your diagnosis and situation, designed to meet your specific needs.  With this comprehensive rehabilitation program, you will be expected to participate in at least 3 hours of rehabilitation therapies Monday-Friday, with modified therapy programming on the weekends.  Your rehabilitation program will include the following services:  Physical Therapy (PT), Occupational Therapy (OT), Speech Therapy (ST), 24 hour per day rehabilitation nursing, Therapeutic Recreaction (TR), Neuropsychology, Case Management (Social Worker), Rehabilitation Medicine, Nutrition Services and Pharmacy Services  Weekly team conferences will be held on Tuesdays to discuss your progress.  Your Social Worker will talk with you frequently to get your input and to update you on team discussions.  Team conferences with you and your family in attendance may also be held.  Expected length of stay: 7 Days  Overall anticipated outcome: Independent Depending on your progress and recovery, your program may change. Your Social Worker will coordinate services and will keep you informed of any changes. Your Social Worker's name and contact numbers are listed  below.  The following services may also be recommended but are not provided by the Inpatient Rehabilitation Center:   Home Health Rehabiltiation Services  Outpatient Rehabilitation Services    Arrangements will be made to provide these services after discharge if needed.  Arrangements include referral to agencies that provide these services.  Your insurance has been verified to be: BCBS Your primary doctor is:  Altamease Oiler Redmon  Pertinent information will be shared with your doctor and your insurance company.  Social Worker:  Lavera Guise,  [place] [phone] or ([phone]   Information discussed with and copy given to patient by: Andria Rhein, 04/20/2019, 9:14 AM

## 2019-04-20 NOTE — Progress Notes (Signed)
Physical Therapy Session Note  Patient Details  Name: Jean Carlson MRN: [id number] Date of Birth: 25-Dec-2000  Today's Date: 04/20/2019 PT Individual Time: 7829-5621 1015-1130 PT Individual Time Calculation (min): 75 min and 75  Short Term Goals: Week 1:  PT Short Term Goal 1 (Week 1): =LTG due to ELOS Week 2:    Week 3:     Skilled Therapeutic Interventions/Progress Updates:    PAIN denies pain  Pt initially oob in wc and eager to participate.  Grandmother requesting to participate in MS education as offered.  Discussed w/pt & grandmother energy conservation/planning strategies, warning signs of overdoing it, planning of day to prevent overdoing it, warning signs of exaserbation, basics of MS disease, importance of healthy diet, avoiding overheating, fiber to promote regularity of bowels, common urinary issues, importance of regular exercise including stretchine/cardiovascular/strength in moderation, etc.  Following ed, pt w/questions regarding sexual health and deferred to MD.  Later in day discussed w/Dr. Richardson Dopp and relaxed ed needs which she then discussed setting up time to discuss w/pt.     Gait 159ft no AD w/cga, mild ataxia, mild unsteadyness, varying cadence.  Stretching - instructed w/and performed sidelying quad stretch, supine hamstring stretch, seated groin stretch x 1 min each .  Pt w/mild R hip flexor tightness noted.     Repeated STS holding slidingboard, holding slidingboard w/cones to promote balance and UE coordination w/transition, LE strength.  Gait weaving thru cones for balance challenge w/cga to close supervision. Retrieving cones from floor w/cga to close supervision.  Gait w//tight turns w/cga, loses balance mildly w/pivots but recovers w/cga only. Gait back to room w/supervision to cga, as described above.  Upon return to room, grandmother w/mult additional questions regarding MS, anxiety, disease progression, etc.  Educated pt/grandmother w/some issues  deferred to md for in depth discussion esp relating to prognosis/determining "type of MS", and sexual health.        PM session: PAIN Denies pain  Pt initially oob in wc.  STS w/supervision, gait greater than 546ft w/close supervision to cga w/turns, mild tremors LLE vs R, increased tone LUE requires cues to increase armswing/relax UE.  Gait pattern significantly improved vs am session w/less ataxia.  Pt encouraged to rest/cool down between activities and again reviewed importance of pacing activity/remaining cool/rest.  Mult rest breaks provided during session w/continued education during breaks.  Sidestepping 20 ft L/R x 4 each w/cga, uses line on floor as target for improved coordination/placement of lead foot. Standing feet together eyes open/closed supervision, increased sway w/eyes closed Standing tandem eyes open/closed, increased sway w/both, moresoe w/eyes closed  Horsehoes including pivoting L/R to obtain horeshoes placed in rail of hallway wall, backing to starting point, tossing w/alternating hands, and retrieving horeshoes from floor w/alternating hands . Also incorporated backing to start x 57ft w/cga, horshoe toss w/feet together and w/feet tandem.  Pt requesting to play "Wii" at end of session.  Message relayed to rec therapy to explore opportunities w/pt and also to discuss relaxation techniques due to pt appearing somewhat anxious in setting of ADHD.  Pt ambulated back to room w/supervision, cues for armswing.  Grandmother again present and relayed plan to meed w/MD in OPT as well as am education in regards to above issues.    Pt left oob in wc w/alarm belt set and needs in reach   Therapy Documentation Precautions:  Precautions Precautions: Fall Precaution Comments: diplopia Restrictions Weight Bearing Restrictions: No    Therapy/Group: Individual Therapy  Rada Hay, PT  Jerrilyn Cairo 04/20/2019, 2:46 PM

## 2019-04-20 NOTE — IPOC Note (Signed)
Overall Plan of Care [facility]) Patient Details Name: Jean Carlson MRN: [id number] DOB: Apr 27, 2000  Admitting Diagnosis: Demyelinating disorder [facility])  Hospital Problems: Principal Problem:   Demyelinating disorder [facility]) Active Problems:   Neurogenic bladder     Functional Problem List: Nursing Behavior, Bladder, Edema, Medication Management, Pain, Safety  PT Balance, Endurance, Motor, Safety, Sensory  OT Balance, Perception, Safety, Endurance, Motor, Sensory  SLP    TR         Basic ADL's: OT Grooming, Bathing, Dressing, Toileting     Advanced  ADL's: OT Simple Meal Preparation     Transfers: PT Bed Mobility, Bed to Chair, Car, Furniture, Futures trader, Metallurgist: PT Ambulation, Emergency planning/management officer, Stairs     Additional Impairments: OT Fuctional Use of Upper Extremity  SLP        TR      Anticipated Outcomes Item Anticipated Outcome  Self Feeding n/a  Swallowing      Basic self-care  mod I  Toileting  mod I   Bathroom Transfers mod I  Bowel/Bladder  Min assist  Transfers  mod I  Locomotion  mod I with LRAD  Communication     Cognition     Pain  <3 on a 0-10 pain scale  Safety/Judgment  Min assist with transfers   Therapy Plan: PT Intensity: Minimum of 1-2 x/day ,45 to 90 minutes PT Frequency: 5 out of 7 days PT Duration Estimated Length of Stay: 5-7 days OT Intensity: Minimum of 1-2 x/day, 45 to 90 minutes OT Frequency: 5 out of 7 days OT Duration/Estimated Length of Stay: ~7-10 days     Due to the current state of emergency, patients may not be receiving their 3-hours of Medicare-mandated therapy.   Team Interventions: Nursing Interventions Patient/Family Education, Pain Management, Bladder Management, Medication Management, Discharge Planning, Bowel Management, Psychosocial Support, Disease Management/Prevention  PT interventions Ambulation/gait training, Training and development officer, Community reintegration,  Discharge planning, Disease management/prevention, Functional mobility training, Neuromuscular re-education, Patient/family education, Psychosocial support, Stair training, Therapeutic Activities, Therapeutic Exercise, UE/LE Strength taining/ROM, UE/LE Coordination activities  OT Interventions Balance/vestibular training, Discharge planning, Pain management, Self Care/advanced ADL retraining, Therapeutic Activities, UE/LE Coordination activities, Functional mobility training, Patient/family education, Therapeutic Exercise, Visual/perceptual remediation/compensation, Community reintegration, DME/adaptive equipment instruction, Neuromuscular re-education, Psychosocial support, UE/LE Strength taining/ROM  SLP Interventions    TR Interventions    SW/CM Interventions Discharge Planning, Psychosocial Support, Patient/Family Education   Barriers to Discharge MD  Medical stability, Home enviroment access/loayout, Incontinence, Neurogenic bowel and bladder, Lack of/limited family support and MS slowed processing  Nursing Inaccessible home environment, Incontinence, Decreased caregiver support, Lack of/limited family support, Home environment access/layout, Medication compliance    PT      OT      SLP      SW       Team Discharge Planning: Destination: PT-Home ,OT- Home , SLP-  Projected Follow-up: PT-Outpatient PT, OT-  Outpatient OT, SLP-  Projected Equipment Needs: PT-To be determined, OT- To be determined, SLP-  Equipment Details: PT-TBD pending progress, none recommended at this time, OT-  Patient/family involved in discharge planning: PT- Patient,  OT-Patient, SLP-   MD ELOS: 5-7 days Medical Rehab Prognosis:  Good Assessment: Pt is a 19 yr old female with newly dx'd MS with generalized and LE weaknesss/p solumedrol- pt also has MS cognition- thinking will play softball in the next few weeks-  Also has urinary incontinence with dribbling- wants pelvic floor HEP.  Goals Mod  I to min assist  for bowel/bladder-    See Team Conference Notes for weekly updates to the plan of care

## 2019-04-21 ENCOUNTER — Inpatient Hospital Stay (HOSPITAL_COMMUNITY): Payer: BC Managed Care – PPO

## 2019-04-21 ENCOUNTER — Inpatient Hospital Stay (HOSPITAL_COMMUNITY): Payer: BC Managed Care – PPO | Admitting: Physical Therapy

## 2019-04-21 LAB — CBC WITH DIFFERENTIAL/PLATELET
Abs Immature Granulocytes: 0.03 10*3/uL (ref 0.00–0.07)
Basophils Absolute: 0 10*3/uL (ref 0.0–0.1)
Basophils Relative: 0 %
Eosinophils Absolute: 0.1 10*3/uL (ref 0.0–0.5)
Eosinophils Relative: 2 %
HCT: 40.6 % (ref 36.0–46.0)
Hemoglobin: 14.5 g/dL (ref 12.0–15.0)
Immature Granulocytes: 1 %
Lymphocytes Relative: 40 %
Lymphs Abs: 2.2 10*3/uL (ref 0.7–4.0)
MCH: 29.7 pg (ref 26.0–34.0)
MCHC: 35.7 g/dL (ref 30.0–36.0)
MCV: 83.2 fL (ref 80.0–100.0)
Monocytes Absolute: 0.7 10*3/uL (ref 0.1–1.0)
Monocytes Relative: 13 %
Neutro Abs: 2.4 10*3/uL (ref 1.7–7.7)
Neutrophils Relative %: 44 %
Platelets: 224 10*3/uL (ref 150–400)
RBC: 4.88 MIL/uL (ref 3.87–5.11)
RDW: 11.9 % (ref 11.5–15.5)
WBC: 5.4 10*3/uL (ref 4.0–10.5)
nRBC: 0 % (ref 0.0–0.2)

## 2019-04-21 LAB — BASIC METABOLIC PANEL
Anion gap: 9 (ref 5–15)
BUN: 7 mg/dL (ref 6–20)
CO2: 24 mmol/L (ref 22–32)
Calcium: 8.8 mg/dL — ABNORMAL LOW (ref 8.9–10.3)
Chloride: 105 mmol/L (ref 98–111)
Creatinine, Ser: 0.74 mg/dL (ref 0.44–1.00)
GFR calc Af Amer: >60 mL/min (ref >60)
GFR calc non Af Amer: >60 mL/min (ref >60)
Glucose, Bld: 84 mg/dL (ref 70–99)
Potassium: 3.5 mmol/L (ref 3.5–5.1)
Sodium: 138 mmol/L (ref 135–145)

## 2019-04-21 NOTE — Progress Notes (Signed)
[facility] PHYSICAL MEDICINE & REHABILITATION PROGRESS NOTE   Subjective/Complaints:  Pt reports slept OK- bowel working OK.   Denies pain; does have occ muscle spasms in neck.    ROS:  Pt denies SOB, abd pain, CP, N/V/C/D, and vision changes   Objective:   No results found. Recent Labs    04/21/19 0658  WBC 5.4  HGB 14.5  HCT 40.6  PLT 224   Recent Labs    04/21/19 0658  NA 138  K 3.5  CL 105  CO2 24  GLUCOSE 84  BUN 7  CREATININE 0.74  CALCIUM 8.8*    Intake/Output Summary (Last 24 hours) at 04/21/2019 1651 Last data filed at 04/21/2019 0911 Gross per 24 hour  Intake 490 ml  Output --  Net 490 ml     Physical Exam: Vital Signs Blood pressure 120/90, pulse (!) 105, temperature 98.3 F (36.8 C), resp. rate 16, height 5\' 4"  (1.626 m), weight 83.1 kg, SpO2 99 %.   Physical Exam Constitutional: laying supine in bed; just woke up, NAD  HENT:  Head:conjugate except to R Cardiovascular:RRR, no JVD Respiratory:CTA B/L- good air movement HK:VQQV, NT, ND, (+)BS Musculoskeletal:  Cervical back: Normal range of motion.  Tight in scalenes, upper traps and levators B/L  Comments: No obvious pain left knee with AROM/PROM. No joint effusion Neurological: Ox3  Mild left PD. Strength 5/5 bilateral UE. RLE 5/5. LLE 4+/5. Decreased LT LLE, foot more affected than lower leg. Decreased HTS LLE.  Hyperextending BL knees with gait-  Skin: Skin iswarmand dry.  Psychiatric:tangential; sleepy    Assessment/Plan: 1. 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  Physiatrist is providing close team supervision and 24 hour management of active medical problems listed below.  Physiatrist and rehab team continue to assess barriers to discharge/monitor patient progress toward functional and medical goals  Care Tool:  Bathing    Body  parts bathed by patient: Right arm, Left arm, Chest, Abdomen, Front perineal area, Buttocks, Right upper leg, Left upper leg, Right lower leg, Left lower leg, Face   Body parts bathed by helper: Right arm, Left upper leg, Right lower leg, Chest, Left arm, Abdomen, Front perineal area, Buttocks, Right upper leg, Face, Left lower leg     Bathing assist Assist Level: Supervision/Verbal cueing     Upper Body Dressing/Undressing Upper body dressing   What is the patient wearing?: Pull over shirt    Upper body assist Assist Level: Supervision/Verbal cueing    Lower Body Dressing/Undressing Lower body dressing      What is the patient wearing?: Underwear/pull up, Pants     Lower body assist Assist for lower body dressing: Supervision/Verbal cueing     Toileting Toileting    Toileting assist Assist for toileting: Supervision/Verbal cueing Assistive Device Comment: Walker   Transfers Chair/bed transfer  Transfers assist     Chair/bed transfer assist level: Supervision/Verbal cueing Chair/bed transfer assistive device: Museum/gallery exhibitions officer assist      Assist level: Supervision/Verbal cueing Assistive device: No Device Max distance: 300+   Walk 10 feet activity   Assist     Assist level: Supervision/Verbal cueing Assistive device: No Device   Walk 50 feet activity   Assist    Assist level: Supervision/Verbal cueing Assistive device: No Device    Walk 150 feet activity   Assist    Assist level: Supervision/Verbal  cueing Assistive device: No Device    Walk 10 feet on uneven surface  activity   Assist Walk 10 feet on uneven surfaces activity did not occur: Safety/medical concerns   Assist level: Minimal Assistance - Patient > 75% Assistive device: Other (comment)(no device)   Wheelchair     Assist Will patient use wheelchair at discharge?: No             Wheelchair 50 feet with 2 turns  activity    Assist            Wheelchair 150 feet activity     Assist          Blood pressure 120/90, pulse (!) 105, temperature 98.3 F (36.8 C), resp. rate 16, height 5\' 4"  (1.626 m), weight 83.1 kg, SpO2 99 %.  Medical Problem List and Plan: 1.Decreased functional mobilitysecondary to demyelinating disorder/multiple sclerosis.IV Solu-Medrol completed. Patient to follow-up Dr. at the outpatient neurologic center -patient may shower  3/24- explained to pt goal is to focus on letting her learn to walk again without falling- not ready for softball esp to play in games for at least a few months. Pt unhappy;   3/26- will educate pt every weekday AM on something new about MS- wait for trigger point injections of neck until f/u -ELOS/Goals: 8-10 days, supervision to mod I 2. Antithrombotics: -DVT/anticoagulation:SCDs -antiplatelet therapy: N/A 3. Pain Management:Tylenol as needed 4. Mood:Prozac 40 mg daily -antipsychotic agents: N/A 5. Neuropsych: This patientiscapable of making decisions on herown behalf. 6. Skin/Wound Care:Routine skin checks 7. Fluids/Electrolytes/Nutrition:Routine in and outs with follow-up chemistries 8. ADHD. Continue Concerta 30 mg daily 9. Tobacco abuse. Counseling 10. Hx of left pain after fall. Pt s/p MRI ordered by ortho on 04/07/19 which revealed bone contusion along the anterior aspect of lateral tibial plateau. 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 No ligament or meniscal tears.  -conservative care, pain relief, fall prevention/gait mechanics with PT -pt reports hx of left knee hyperextension (likely related to #1) during gait which predisposed to her pain/falls. -pt has knee brace at home given to her by ortho. Asked her to have it brought in. 11. Urinary urge incontinence with dribbling  3/23- will d/w pt further-  might benefit from Myrbetriq. Don't want to try Oxybutynin since pt already has distractibility and don't want to cause cognitive issues.  3/24- pt more interested in pelvic floor HEP- will send to pelvic floor specialist after d/c- explained if do exercises wrong, can make things worse.  3/25- will speak to PT about any exercises until sees outpatient  3/26- will need to see Pelvic floor PT-  12. Hyperkalemia  3/23- was repleted from 3.4- now 5.5- will see if will come back down since is due to repletion.   3/25- labs in AM  3/26- K+ 3.5- will recheck Monday 13. Constipation- neurogenic bowel - will con't Dulcolax pills and watch/monitor  14. MS- 3/24- pt asking if can drink while on MS meds- explained usually not, if in best case scenario, can drink ONE drink in 24 hours- pt unhappy about that-    3/26- discussed types of MS- relapsing remitting 1 progressive and common Sx's  LOS: 4 days A FACE TO FACE EVALUATION WAS PERFORMED  Kobey Sides 04/21/2019, 4:51 PM

## 2019-04-21 NOTE — Progress Notes (Signed)
Occupational Therapy Session Note  Patient Details  Name: Jean Carlson MRN: [id number] Date of Birth: 07-Feb-2000  Today's Date: 04/21/2019 OT Individual Time: 0215-0330 OT Individual Time Calculation (min): 75 min    Short Term Goals: Week 1:  OT Short Term Goal 1 (Week 1): LTG=STG  Skilled Therapeutic Interventions/Progress Updates:  Pt received supine in bed agreeable to OT intervention with grandmother present. Pt completed toileting tasks with supervision with no AD. Pt ambulated to day room with no AD and CGA for balance. Pt completed functional stair training task with pt stepping up/down onto aerobic step with CGA for balance. Education provided on stepping up with R leg and down with L leg. Pt completed various standing dynamic balance activities standing on airex mat with supervision- CGA for balance. Trialed use of weighted cuff on LUE during [facility] task noting slightly improved tremors and smooth functional reach. Pt completed functional IADL tasks where pt instructed to gather items on floor;education provided on safe stance for reaching items on floor with pt verbalizing understanding. Pt completed stair training in stairwell with MIN A- CGA for balance holding on to R railing. Pt required verbal cues to get both feet on step before advancing to next step. Pt ambulated back to room in similar fashion as previously indicated. Pt handed off to NT to gather vitals.   Therapy Documentation Precautions:  Precautions Precautions: Fall Precaution Comments: diplopia Restrictions Weight Bearing Restrictions: No General:   Vital Signs: Therapy Vitals Temp: 98.3 F (36.8 C) Pulse Rate: (!) 105 Resp: 16 BP: 120/90 Patient Position (if appropriate): Sitting Oxygen Therapy SpO2: 99 % O2 Device: Room Air Pain: Pt reports no pain during session.   Therapy/Group: Individual Therapy  Angelina Pih 04/21/2019, 4:23 PM

## 2019-04-21 NOTE — Progress Notes (Signed)
Occupational Therapy Session Note  Patient Details  Name: Jean Carlson MRN: [id number] Date of Birth: 09-01-2000  Today's Date: 04/21/2019 OT Individual Time: 0900-1015 OT Individual Time Calculation (min): 75 min    Short Term Goals: Week 1:  OT Short Term Goal 1 (Week 1): LTG=STG  Skilled Therapeutic Interventions/Progress Updates:  Pt received supine in bed agreeable to OT session. Pt reports having just woken up wanting to get dressed and complete grooming tasks. Pt able to retrieve clothes from bag on floor with no AD and supervision. Overall, pt requires CGA - supervision for UB/LB dressing from EOB and standing grooming tasks at sink. Pt continues to require initial CGA when standing from EOB to initially steady self. Pt ambulated from room to therapy gym with CGA needing verbal cues to slow down during turns for safety; pt reports no diplopia during functional mobility. Pt completed dynamic standing balance activity using dynavision to reach out of BOS to push various lighted buttons on airex ~ 4 mins needing CGA for balance. Pt completed x2 trials, first using BUEs and second using LUE only. Pt reports numbness and tingling when pushing buttons with L hand; avg score 95 and 1.89 reaction time. Pt completed BLE coordination/ balance activity where pt instructed to navigate ladder forwards/ sidestepping/ backwards with CGA- MINA. Pt reports feeling most unsteady when stepping backwards. Instructed pt on dynamic balance/FMC  task where pt stood on airex to play Jenga with L hand to facilitate [facility] and improved standing balance. Pt able to complete task with CGA and able to retrieve Jenga blocks without knocking tower over. Pt ambulated back to room with CGA where pt request to use bathroom. Pt left on toilet with NT and next PT aware of pt location.   Therapy Documentation Precautions:  Precautions Precautions: Fall Precaution Comments: diplopia Restrictions Weight Bearing Restrictions:  No General:   Vital Signs:  Pain: Pt reports no pain during session.   Therapy/Group: Individual Therapy  Angelina Pih 04/21/2019, 10:40 AM

## 2019-04-21 NOTE — Progress Notes (Signed)
Physical Therapy Session Note  Patient Details  Name: Jean Carlson MRN: [id number] Date of Birth: September 25, 2000  Today's Date: 04/21/2019 PT Individual Time: 1030-1150 PT Individual Time Calculation (min): 80 min   Short Term Goals: Week 1:  PT Short Term Goal 1 (Week 1): =LTG due to ELOS  Skilled Therapeutic Interventions/Progress Updates:   Pt received sitting toilet and agreeable to PT. Pt able to performed self care and clothing management without cues or assist. PT instructed pt in gait training through simulated community environment including hall of hospital, through food court, gift shop and over unlevel cement and bricked sidewalks, [phone] per bout with supervision assist.  Pt noted to have increasing extensor tone in the LLE with fatigue, requiring seated rest breaks, but no LOB throughout.   Pt transported to day room. Dynamic balance training while engaged in BUE fine motor dual task of Wii resort; archery x 2, sword play x 4, basketball x 1, table tennis x 2 and jet skis x 1. Distant supervision assist from PT for safety in maintaining balance with cues care awareness of obstcles in environment to prevent fall. Pt required multiple seated rest breaks, reporting increased body temp and tonal response in LLE and LUE.  Patient returned to room and left sitting EOB with grandmother present, with call bell in reach and all needs met.         Therapy Documentation Precautions:  Precautions Precautions: Fall Precaution Comments: diplopia Restrictions Weight Bearing Restrictions: No Pain: Pain Assessment Pain Scale: 0-10 Pain Score: 0-No pain   Therapy/Group: Individual Therapy  ALorie Phenix3/26/2021, 1:52 PM

## 2019-04-21 NOTE — Progress Notes (Addendum)
Pt is wanting to know if her insurance will cover a cooling vest? Who do we need to ask and how can we get her an answer before she leaves Monday? Pt states to call the [phone] # when there is an answer.

## 2019-04-22 ENCOUNTER — Encounter (HOSPITAL_COMMUNITY): Payer: BC Managed Care – PPO | Admitting: Occupational Therapy

## 2019-04-22 ENCOUNTER — Inpatient Hospital Stay (HOSPITAL_COMMUNITY): Payer: BC Managed Care – PPO | Admitting: Physical Therapy

## 2019-04-22 ENCOUNTER — Ambulatory Visit (HOSPITAL_COMMUNITY): Payer: BC Managed Care – PPO | Admitting: Physical Therapy

## 2019-04-22 DIAGNOSIS — E876 Hypokalemia: Secondary | ICD-10-CM

## 2019-04-22 DIAGNOSIS — F909 Attention-deficit hyperactivity disorder, unspecified type: Secondary | ICD-10-CM

## 2019-04-22 DIAGNOSIS — K592 Neurogenic bowel, not elsewhere classified: Secondary | ICD-10-CM

## 2019-04-22 DIAGNOSIS — N319 Neuromuscular dysfunction of bladder, unspecified: Secondary | ICD-10-CM

## 2019-04-22 NOTE — Progress Notes (Signed)
Physical Therapy Session Note  Patient Details  Name: Jean Carlson MRN: [id number] Date of Birth: 2000-02-10  Today's Date: 04/22/2019 PT Individual Time: 1112-1207 and 5176-1607 PT Individual Time Calculation (min): 55 min and 57 min  Short Term Goals: Week 1:  PT Short Term Goal 1 (Week 1): =LTG due to ELOS  Skilled Therapeutic Interventions/Progress Updates:    Session 1: Pt received sitting EOB with her grandmother and father present for family education. Pt agreeable to therapy session. Therapist educated pt/family on pt's CLOF, plan for follow-up OPPT, and generalized MS education regarding fatigue, heat sensitivity, and importance of regular exercise. Sit<>stands throughout session with distant supervision. Gait ~382ft to ortho gym, no AD, with supervision - therapist educating pt's family on standing on her left side for safety. Performed ambulatory simulated car transfer with supervision. Ambulated ~86ft up/down ramp, ~25ft x2 over mulch, and up/down curb step all without UE support and close supervision for safety - pt reports much improved ability to walk on the mulch. Ambulated ~167ft back to main therapy gym. Pt's father reports he has built a ramp to access their porch but that the pt will have 2 steps x2 to enter the home with close walls and doorways for UE support but no handrails. Ascended/descended 4 steps without UE support with CGA for steadying and pt demonstrating increased unsteadiness on descent. Therapist educated pt's family on proper positioning to provide assist. Ascended/descended 4 steps using light UE support on handrails while her grandmother demonstrated proper CGA/close supervision for pt's safety on steps. Gait ~11ft back to pt's room with supervision - 1x pt had minor LOB when stopping to turn and look back at her father but she was able to recover balance without physical assistance. Pt's family denies any questions/concerns at this time. Performed remainder of  therapy session focusing on pt's dynamic standing balance. Gait ~165ft x2 to/from therapy gym with supervision. Pt reports she enjoyed doing taekwondo therefore performed higher level dynamic standing balance task of standing on single LE while kicking boxing bag x12reps per LE with CGA/min assist for balance. Standing balance task on airex with L UE NMR task of placing small PEGs in board with close supervision for safety. Ambulated back to room and pt left supine in bed with needs in reach and bed alarm on.  Session 2: Pt received sitting cross legged in the bed with her grandmother present and pt agreeable to therapy session. Sit<>stands, no AD, with close supervision for safety but no unsteadiness. Ambulated ~359ft while performing higher level dynamic gait challenges of sudden stopping with turns and horizontal head turns all with CGA for safety but pt able to utilize balance strategies to maintain upright without assist. Standing in // bars performed: heel raises 2x15 reps with light UE support progress to none with close supervision for safety, standing squats 2x12reps with intermittent UE support to recover posterior LOB with CGA for safety. Gait ~183ft to day room with continued sudden stops and turning with close supervision. Performed standing balance and standing tolerance tasks using Wii [employer] games as pt enjoys playing this at home with close supervision but no LOB noted. Dynamic standing balance task of 1/2 circle cone taps focused on increased speed with CGA for steadying. Ambulated back to room with supervision and pt demonstrating increasing fatigue with more impaired L LE swing and stance phase mechanics. Pt left sitting in bed with her grandmother present and bed alarm on.  Therapy Documentation Precautions:  Precautions Precautions: Fall  Precaution Comments: diplopia Restrictions Weight Bearing Restrictions: No  Pain: Session 1: No reports of pain throughout  session.  Session 2: No reports of pain throughout session.   Therapy/Group: Individual Therapy  Tawana Scale, PT, DPT 04/22/2019, 8:03 AM

## 2019-04-22 NOTE — Progress Notes (Signed)
[place] PHYSICAL MEDICINE & REHABILITATION PROGRESS NOTE   Subjective/Complaints: Patient seen laying in bed this morning.  She states she slept well overnight.  She states she has questions regarding insurance coverage for "cooling vest".  ROS: Denies CP, SOB, N/V/D  Objective:   No results found. Recent Labs    04/21/19 0658  WBC 5.4  HGB 14.5  HCT 40.6  PLT 224   Recent Labs    04/21/19 0658  NA 138  K 3.5  CL 105  CO2 24  GLUCOSE 84  BUN 7  CREATININE 0.74  CALCIUM 8.8*    Intake/Output Summary (Last 24 hours) at 04/22/2019 1743 Last data filed at 04/22/2019 1500 Gross per 24 hour  Intake 640 ml  Output --  Net 640 ml     Physical Exam: Vital Signs Blood pressure 119/73, pulse 97, temperature 98.4 F (36.9 C), temperature source Oral, resp. rate 17, height 5\' 4"  (1.626 m), weight 83.1 kg, SpO2 98 %. Constitutional: No distress . Vital signs reviewed. HENT: Normocephalic.  Atraumatic. Eyes: EOMI. No discharge. Cardiovascular: No JVD. Respiratory: Normal effort.  No stridor. GI: Non-distended. Skin: Warm and dry.  Intact. Psych: Normal mood.  Normal behavior. Musc: No edema in extremities.  No tenderness in extremities. Neurological: Alert Motor: Grossly 5/5 throughout   Assessment/Plan: 1. 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  Physiatrist is providing close team supervision and 24 hour management of active medical problems listed below.  Physiatrist and rehab team continue to assess barriers to discharge/monitor patient progress toward functional and medical goals  Care Tool:  Bathing    Body parts bathed by patient: Right arm, Left arm, Chest, Abdomen, Front perineal area, Buttocks, Right upper leg, Left upper leg, Right lower leg, Left lower leg, Face   Body parts bathed by helper: Right arm, Left upper leg, Right  lower leg, Chest, Left arm, Abdomen, Front perineal area, Buttocks, Right upper leg, Face, Left lower leg     Bathing assist Assist Level: Supervision/Verbal cueing     Upper Body Dressing/Undressing Upper body dressing   What is the patient wearing?: Pull over shirt    Upper body assist Assist Level: Supervision/Verbal cueing    Lower Body Dressing/Undressing Lower body dressing      What is the patient wearing?: Underwear/pull up, Pants     Lower body assist Assist for lower body dressing: Supervision/Verbal cueing     Toileting Toileting    Toileting assist Assist for toileting: Independent Assistive Device Comment: Walker   Transfers Chair/bed transfer  Transfers assist     Chair/bed transfer assist level: Supervision/Verbal cueing Chair/bed transfer assistive device: assist      Assist level: Supervision/Verbal cueing Assistive device: No Device Max distance: 200'   Walk 10 feet activity   Assist     Assist level: Supervision/Verbal cueing Assistive device: No Device   Walk 50 feet activity   Assist    Assist level: Supervision/Verbal cueing Assistive device: No Device    Walk 150 feet activity   Assist    Assist level: Supervision/Verbal cueing Assistive device: No Device    Walk 10 feet on uneven surface  activity   Assist Walk 10 feet on uneven surfaces activity did not occur: Safety/medical concerns   Assist level: Minimal Assistance - Patient > 75% Assistive device: Other (comment)(no device)   Wheelchair  Assist Will patient use wheelchair at discharge?: No             Wheelchair 50 feet with 2 turns activity    Assist            Wheelchair 150 feet activity     Assist          Blood pressure 119/73, pulse 97, temperature 98.4 F (36.9 C), temperature source Oral, resp. rate 17, height 5\' 4"  (1.626 m), weight 83.1 kg, SpO2 98  %.  Medical Problem List and Plan: 1.Decreased functional mobilitysecondary to demyelinating disorder/multiple sclerosis.IV Solu-Medrol completed. Patient to follow-up Dr. Felecia Shelling at the outpatient neurologic center  Continue CIR  2. Antithrombotics: -DVT/anticoagulation:SCDs -antiplatelet therapy: N/A 3. Pain Management:Tylenol as needed Patient for. Mood:Prozac 40 mg daily -antipsychotic agents: N/A 5. Neuropsych: This patientiscapable of making decisions on herown behalf. 6. Skin/Wound Care:Routine skin checks 7. Fluids/Electrolytes/Nutrition:Routine in and outs.  BMP within acceptable range on 3/26 8. ADHD.  Continue Concerta 30 mg daily 9. Tobacco abuse. Counseling 10. Hx of left pain after fall. Pt s/p MRI ordered by ortho on 04/07/19 which revealed bone contusion along the anterior aspect of lateral tibial plateau. 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 No ligament or meniscal tears.  -conservative care, pain relief, fall prevention/gait mechanics with PT -pt reports hx of left knee hyperextension (likely related to #1) during gait which predisposed to her pain/falls. -pt has knee brace at home given to her by ortho.  Patient to bring in 12. Urinary urge incontinence with dribbling  3/24- pt more interested in pelvic floor HEP-plan to refer patient for pelvic floor specialist after d/c 12. Hypokalemia  Potassium 3.5 on 3/26, labs ordered for Monday 13. Constipation- neurogenic bowel - will con't Dulcolax pills and watch/monitor   Improving overall  LOS: 5 days A FACE TO FACE EVALUATION WAS PERFORMED  Zhania Shaheen Lorie Phenix 04/22/2019, 5:43 PM

## 2019-04-22 NOTE — Progress Notes (Signed)
Occupational Therapy Session Note  Patient Details  Name: Jean Carlson MRN: [id number] Date of Birth: Jun 08, 2000  Today's Date: 04/22/2019 OT Individual Time: 5027-7412 OT Individual Time Calculation (min): 60 min    Short Term Goals: Week 1:  OT Short Term Goal 1 (Week 1): LTG=STG  Skilled Therapeutic Interventions/Progress Updates:    Pt's family in for education session.  Pt was able to complete transfers from the bed to the toilet and complete toileting tasks with independence.  [name]  She was able to complete functional mobility down to the therapy gym as well as the tub/shower room.  She completed step in shower tub transfer with modified independence using the wall for support as needed.  Discussed with family the need for a shower seat initially at home for safety and the report already having.  She was then able to complete weight bearing activities in quadriped on the therapy mat for help in decreasing LUE ataxia.  She completed 1set of 10 reps for modified pushup as well as lifting the RUE and holding while supporting with the LUE for five second intervals.  She needed min assist to hold the RUE out as well as the LLE and maintain.  Transitioned to working on standing balance with use of the foam.  She was able to stand with supervision and complete reaching side to side, out in front as well as down toward the floor.  She could also complete head rotations as well as cervical extension without LOB.  Had her stand with eyes closed with feet apart as well as together.  Min guard assist needed with feet closer together.  Finished gym work with pt in sitting and therapist looking further at her vision.  Pt reports improvement in diplopia and that it is now only present in the far right peripheral field.  She demonstrated good AROM in all directions with 3 or less beats of nystagmus with gaze holding in all  lateral fields.  VOR was intact however she did exhibit delays and a catch up saccade needed with head turn to the right.  Slight catch up saccade also noted with with VOR cancellation test.  Feel these results may affect her mobility when trying to follow objects with her eyes while she is moving her head as well and walking.  With mobility and head turns while walking back to the room, pt did not report significant visual deficits and did not demonstrate LOB.  Discussed results with her father and grandfather and recommended continued therapy at discharge to further treat these deficits.  Pt left in the room at end of session with PT coming in for next visit.  Therapy Documentation Precautions:  Precautions Precautions: Fall Precaution Comments: diplopia Restrictions Weight Bearing Restrictions: No Pain: Pain Assessment Pain Scale: Faces Pain Score: 0-No pain ADL: See Care Tool Section for some details of mobility and selfcare  Therapy/Group: Individual Therapy  Talyn Dessert OTR/L 04/22/2019, 12:42 PM

## 2019-04-23 ENCOUNTER — Inpatient Hospital Stay (HOSPITAL_COMMUNITY): Payer: BC Managed Care – PPO | Admitting: Occupational Therapy

## 2019-04-23 ENCOUNTER — Inpatient Hospital Stay (HOSPITAL_COMMUNITY): Payer: BC Managed Care – PPO

## 2019-04-23 DIAGNOSIS — E6609 Other obesity due to excess calories: Secondary | ICD-10-CM

## 2019-04-23 DIAGNOSIS — Z6831 Body mass index (BMI) 31.0-31.9, adult: Secondary | ICD-10-CM

## 2019-04-23 NOTE — Progress Notes (Signed)
Physical Therapy Discharge Summary  Patient Details  Name: Jean Carlson MRN: [id number] Date of Birth: [birth year]  Today's Date: 04/23/2019 PT Individual Time: 0802-0900 PT Individual Time Calculation (min): 58 min    Patient has met 8 of 8 long term goals due to improved activity tolerance, improved balance, increased strength, and improved attention.  Patient to discharge at an ambulatory level Modified Independent.   Patient's care partner is independent to provide the necessary physical assistance at discharge. Pt's family present for hands on family education yesterday including gait, mobility and steps.   All goals met.   Recommendation:  Patient will benefit from ongoing skilled PT services in outpatient setting to continue to advance safe functional mobility, address ongoing impairments in balance, strength, and minimize fall risk.  Equipment: No equipment provided  Reasons for discharge: treatment goals met and discharge from hospital  Patient/family agrees with progress made and goals achieved: Yes  PT Treatment Interventions: Pt supine in bed upon PT arrival, agreeable to therapy tx and denies pain. Pt performs bed mobility independently, transferred to sitting and then to standing independently. Pt ambulates throughout the room today independently, ambulates to the sink and to get hair brush, brushes hair this morning. Pt ambulated throughout the unit >300 ft independently without AD. Pt performs car transfer Mod I using door to hold on to. Pt ambulated across unlevel surfaces this session - ramp and mulch and curb - mod I. Pt ambulated to the gym and ascended/descended steps without rails, supervision for safety with decreased eccentric knee control noted L LE. Pt ascended/descended 12 steps with single rail and mod I. Pt performed floor transfer this session without device to pull up on, supervision for safety, education on techniques and what to do in case of a fall. Pt  participated in berg balance test as detailed below, scored 50/56 and discussed these results with the patient.  Pt participated in dynamic balance training to perform the following - gait with head turns, backwards gait, sidestepping, gait with eyes closed, and tandem gait - supervision overall for higher level balance tasks. Pt performed 2 x 10squats with emphasis on knee control and symmetric LE weightbearing. Pt ambulated back to room and made Mod I in the room.   PT Discharge Precautions/Restrictions Precautions Precautions: Fall Precaution Comments: diplopia Restrictions Weight Bearing Restrictions: No Vital Signs Therapy Vitals Temp: 98.1 F (36.7 C) Temp Source: Oral Pulse Rate: 77 BP: 109/65 Patient Position (if appropriate): Lying Oxygen Therapy SpO2: 97 % O2 Device: Room Air Pain Pain Assessment Pain Scale: 0-10 Pain Score: 0-No pain Cognition Overall Cognitive Status: Within Functional Limits for tasks assessed Arousal/Alertness: Awake/alert Orientation Level: Oriented X4 Attention: Selective Selective Attention: Appears intact Memory: Appears intact Awareness: Appears intact Problem Solving: Appears intact Safety/Judgment: Appears intact Sensation Sensation Light Touch: Appears Intact Light Touch Impaired Details: Impaired LUE(occas) Proprioception: Impaired Detail(L knee) Additional Comments: pt reports improved sensation since eval, sensation intact bilateral LEs and feels the same both sides. Occasional L UE numbness Coordination Gross Motor Movements are Fluid and Coordinated: No Fine Motor Movements are Fluid and Coordinated: No Coordination and Movement Description: mild L ataxia noted Motor  Motor Motor - Discharge Observations: mild L hemiparesis, L knee instability  Mobility Bed Mobility Bed Mobility: Rolling Right;Rolling Left;Supine to Sit;Sit to Supine Rolling Right: Independent Rolling Left: Independent Supine to Sit: Independent Sit to  Supine: Independent Transfers Transfers: Sit to Stand;Stand to Sit;Stand Pivot Transfers Sit to Stand: Independent Stand to Sit: Independent Stand Pivot  Transfers: Independent Transfer (Assistive device): None Locomotion  Gait Ambulation: Yes Gait Assistance: Nurse, learning disability (Feet): 300 Feet Assistive device: None Gait Gait: Yes Gait Pattern: Impaired Gait Pattern: Ataxic;Decreased hip/knee flexion - left;Lateral hip instability Stairs / Additional Locomotion Stairs: Yes Stairs Assistance: Independent with assistive device Stair Management Technique: Two rails Number of Stairs: 12 Height of Stairs: 6 Wheelchair Mobility Wheelchair Mobility: No  Trunk/Postural Assessment  Cervical Assessment Cervical Assessment: Within Functional Limits Thoracic Assessment Thoracic Assessment: Within Functional Limits Lumbar Assessment Lumbar Assessment: Within Functional Limits Postural Control Protective Responses: delayed  Balance Balance Balance Assessed: Yes Standardized Balance Assessment Standardized Balance Assessment: Berg Balance Test Berg Balance Test Sit to Stand: Able to stand without using hands and stabilize independently Standing Unsupported: Able to stand safely 2 minutes Sitting with Back Unsupported but Feet Supported on Floor or Stool: Able to sit safely and securely 2 minutes Stand to Sit: Sits safely with minimal use of hands Transfers: Able to transfer safely, minor use of hands Standing Unsupported with Eyes Closed: Able to stand 10 seconds safely Standing Ubsupported with Feet Together: Able to place feet together independently and stand 1 minute safely From Standing, Reach Forward with Outstretched Arm: Can reach confidently >25 cm (10") From Standing Position, Pick up Object from Floor: Able to pick up shoe safely and easily From Standing Position, Turn to Look Behind Over each Shoulder: Looks behind from both sides and weight shifts well Turn 360  Degrees: Able to turn 360 degrees safely in 4 seconds or less Standing Unsupported, Alternately Place Feet on Step/Stool: Able to stand independently and safely and complete 8 steps in 20 seconds Standing Unsupported, One Foot in Front: Loses balance while stepping or standing Standing on One Leg: Able to lift leg independently and hold equal to or more than 3 seconds Total Score: 50 Extremity Assessment  RLE Assessment RLE Assessment: Within Functional Limits General Strength Comments: 5/5 grossly LLE Assessment LLE Assessment: Exceptions to [facility] General Strength Comments: 4+/5 grossly(impaired L knee control and strength seen functionally, mild hip flexor weakness compared to R LE, mild Plantar flexor weakness compared to R LE)    Netta Corrigan, PT, DPT, CSRS 04/23/2019, 8:38 AM

## 2019-04-23 NOTE — Progress Notes (Signed)
[facility] PHYSICAL MEDICINE & REHABILITATION PROGRESS NOTE   Subjective/Complaints: Patient seen laying in bed this morning.  She states she slept well overnight.  She denies complaints.  ROS: Denies CP, SOB, N/V/D  Objective:   No results found. Recent Labs    04/21/19 0658  WBC 5.4  HGB 14.5  HCT 40.6  PLT 224   Recent Labs    04/21/19 0658  NA 138  K 3.5  CL 105  CO2 24  GLUCOSE 84  BUN 7  CREATININE 0.74  CALCIUM 8.8*    Intake/Output Summary (Last 24 hours) at 04/23/2019 1259 Last data filed at 04/23/2019 0739 Gross per 24 hour  Intake 860 ml  Output --  Net 860 ml     Physical Exam: Vital Signs Blood pressure 109/65, pulse 77, temperature 98.1 F (36.7 C), temperature source Oral, resp. rate 17, height 5\' 4"  (1.626 m), weight 83.1 kg, SpO2 97 %.  Constitutional: No distress . Vital signs reviewed. HENT: Normocephalic.  Atraumatic. Eyes: EOMI. No discharge. Cardiovascular: No JVD. Respiratory: Normal effort.  No stridor. GI: Non-distended. Skin: Warm and dry.  Intact. Psych: Normal mood.  Normal behavior. Musc: No edema in extremities.  No tenderness in extremities. Neurological: Alert Motor: Bilateral upper extremities: 5/5 proximal distal Right lower extremity: Hip flexion, knee extension 4/5, ankle dorsiflexion 4+/5 Left lower extremity: Hip flexion, knee extension 4+/5, ankle dorsiflexion 4+/5  Assessment/Plan: 1. 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  Physiatrist is providing close team supervision and 24 hour management of active medical problems listed below.  Physiatrist and rehab team continue to assess barriers to discharge/monitor patient progress toward functional and medical goals  Care Tool:  Bathing    Body parts bathed by patient: Right arm, Left arm, Chest, Abdomen, Front perineal area, Buttocks,  Right upper leg, Left upper leg, Right lower leg, Left lower leg, Face   Body parts bathed by helper: Right arm, Left upper leg, Right lower leg, Chest, Left arm, Abdomen, Front perineal area, Buttocks, Right upper leg, Face, Left lower leg     Bathing assist Assist Level: Supervision/Verbal cueing     Upper Body Dressing/Undressing Upper body dressing   What is the patient wearing?: Pull over shirt    Upper body assist Assist Level: Supervision/Verbal cueing    Lower Body Dressing/Undressing Lower body dressing      What is the patient wearing?: Underwear/pull up, Pants     Lower body assist Assist for lower body dressing: Supervision/Verbal cueing     Toileting Toileting    Toileting assist Assist for toileting: Independent Assistive Device Comment: Walker   Transfers Chair/bed transfer  Transfers assist     Chair/bed transfer assist level: Independent Chair/bed transfer assistive device: assist      Assist level: Independent Assistive device: No Device Max distance: 200'   Walk 10 feet activity   Assist     Assist level: Independent Assistive device: No Device   Walk 50 feet activity   Assist    Assist level: Independent Assistive device: No Device    Walk 150 feet activity   Assist    Assist level: Independent Assistive device: No Device    Walk 10 feet on uneven surface  activity   Assist Walk 10 feet on uneven surfaces activity did not occur: Safety/medical concerns   Assist level: Independent Assistive device:  Other (comment)(no device)   Wheelchair     Assist Will patient use wheelchair at discharge?: No             Wheelchair 50 feet with 2 turns activity    Assist            Wheelchair 150 feet activity     Assist          Blood pressure 109/65, pulse 77, temperature 98.1 F (36.7 C), temperature source Oral, resp. rate 17, height 5\' 4"   (1.626 m), weight 83.1 kg, SpO2 97 %.  Medical Problem List and Plan: 1.Decreased functional mobilitysecondary to demyelinating disorder/multiple sclerosis.IV Solu-Medrol completed. Patient to follow-up Dr. Felecia Shelling at the outpatient neurologic center  Continue CIR  2. Antithrombotics: -DVT/anticoagulation:SCDs -antiplatelet therapy: N/A 3. Pain Management:Tylenol as needed Patient for. Mood:Prozac 40 mg daily -antipsychotic agents: N/A 5. Neuropsych: This patientiscapable of making decisions on herown behalf. 6. Skin/Wound Care:Routine skin checks 7. Fluids/Electrolytes/Nutrition:Routine in and outs.  BMP within acceptable range on 3/26 8. ADHD.  Continue Concerta 30 mg daily 9. Tobacco abuse. Counseling 10. Hx of left pain after fall. Pt s/p MRI ordered by ortho on 04/07/19 which revealed bone contusion along the anterior aspect of lateral tibial plateau. 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 No ligament or meniscal tears.  -conservative care, pain relief, fall prevention/gait mechanics with PT -pt reports hx of left knee hyperextension (likely related to #1) during gait which predisposed to her pain/falls. -pt has knee brace at home given to her by ortho.  Patient to bring in 22. Urinary urge incontinence with dribbling  3/24- pt more interested in pelvic floor HEP-plan to refer patient for pelvic floor specialist after d/c 12. Hypokalemia  Potassium 3.5 on 3/26, labs ordered for tomorrow 13. Constipation- neurogenic bowel - will con't Dulcolax pills and watch/monitor   Improving overall 14.  Obesity: Encourage weight loss  LOS: 6 days A FACE TO FACE EVALUATION WAS PERFORMED  Ormand Senn Lorie Phenix 04/23/2019, 12:59 PM

## 2019-04-23 NOTE — Progress Notes (Signed)
Occupational Therapy Discharge Summary  Patient Details  Name: Jean Carlson MRN: [id number] Date of Birth: [birth year]  Today's Date: 04/23/2019 OT Individual Time: 9735-3299 OT Individual Time Calculation (min): 54 min    Patient has met 9 of 9 long term goals due to improved activity tolerance, improved balance, postural control, ability to compensate for deficits and improved coordination.  Patient to discharge at overall Independent level.  Family has attended therapy sessions and asked appropriate questions.  Reasons goals not met:all goals met  Recommendation:  Patient will benefit from ongoing skilled OT services in outpatient setting to continue to advance functional skills in the area of BADL and iADL.  Equipment: No equipment provided  Reasons for discharge: treatment goals met  Patient/family agrees with progress made and goals achieved: Yes   OT Intervention: Upon entering the room, pt supine in bed with mother present in the room. Pt is currently mod I level within the room and OT reviewed expectations with focus on safety awareness with self care tasks. Pt ambulating throughout room to obtain clothing and needed items to shower. Pt bathing with sit <>stand from shower chair for bathing tasks at mod I level. Pt exiting the bathroom and dressing self from EOB with sit <>stand mod I. Pt performing grooming tasks standing at sink and then picking up dirty clothing and towels from floor at mod I level. OT discussed recommendation for outpatient OT services and discussed how discharge from hospital would occur tomorrow. OT answering questions until no further concern. All needs within reach upon exiting the room.   OT Discharge Precautions/Restrictions  Precautions Precautions: Fall Precaution Comments: diplopia Pain Pain Assessment Pain Scale: 0-10 Pain Score: 0-No pain ADL ADL Eating: Independent Upper Body Bathing: Setup Where Assessed-Upper Body Bathing:  Shower Lower Body Bathing: Contact guard Where Assessed-Lower Body Bathing: Shower Upper Body Dressing: Setup Where Assessed-Upper Body Dressing: Edge of bed Lower Body Dressing: Contact guard Where Assessed-Lower Body Dressing: Edge of bed Toilet Transfer: Minimal assistance Toilet Transfer Method: Wellsite geologist Transfer: Curator Method: Ambulating Vision Baseline Vision/History: Wears glasses Wears Glasses: At all times Patient Visual Report: Diplopia   Cognition Overall Cognitive Status: Within Functional Limits for tasks assessed Arousal/Alertness: Awake/alert Orientation Level: Oriented X4 Attention: Selective Selective Attention: Appears intact Memory: Appears intact Sensation Sensation Light Touch: Appears Intact Light Touch Impaired Details: Impaired LUE Proprioception: Impaired Detail Proprioception Impaired Details: Impaired LUE;Impaired LLE Additional Comments: pt reports improved sensation since eval, sensation intact bilateral LEs and feels the same both sides. Occasional L UE numbness Coordination Gross Motor Movements are Fluid and Coordinated: No Fine Motor Movements are Fluid and Coordinated: No Coordination and Movement Description: mild L ataxia noted Motor  Motor Motor: Hemiplegia Motor - Skilled Clinical Observations: L hemi Motor - Discharge Observations: mild L hemiparesis, L knee instability Mobility  Bed Mobility Bed Mobility: Rolling Right;Rolling Left;Supine to Sit;Sit to Supine Rolling Right: Independent Rolling Left: Independent Supine to Sit: Independent Sit to Supine: Independent Transfers Sit to Stand: Independent Stand to Sit: Independent  Trunk/Postural Assessment  Cervical Assessment Cervical Assessment: Within Functional Limits Thoracic Assessment Thoracic Assessment: Within Functional Limits Lumbar Assessment Lumbar Assessment: Within Functional Limits Postural Control Postural  Control: Deficits on evaluation Protective Responses: delayed  Balance Balance Balance Assessed: Yes Static Sitting Balance Static Sitting - Balance Support: No upper extremity supported;Feet supported Static Sitting - Level of Assistance: 7: Independent Dynamic Sitting Balance Dynamic Sitting - Balance Support: During functional activity Dynamic Sitting -  Level of Assistance: 6: Modified independent (Device/Increase time) Static Standing Balance Static Standing - Balance Support: During functional activity Static Standing - Level of Assistance: 6: Modified independent (Device/Increase time) Dynamic Standing Balance Dynamic Standing - Balance Support: During functional activity Dynamic Standing - Level of Assistance: 6: Modified independent (Device/Increase time) Extremity/Trunk Assessment RUE Assessment RUE Assessment: Within Functional Limits LUE Assessment LUE Assessment: Within Functional Limits Active Range of Motion (AROM) Comments: ROM WFL General Strength Comments: decr coordination - apraxia like but with time pt able to incorporate it in all functional tasks without cuing   Gypsy Decant 04/23/2019, 1:22 PM

## 2019-04-23 NOTE — Discharge Summary (Signed)
Physician Discharge Summary  Patient ID: Jean Carlson MRN: [id number] DOB/AGE: [birth year] 19 y.o.  Admit date: 04/17/2019 Discharge date: 04/24/2019  Discharge Diagnoses:  Principal Problem:   Demyelinating disorder [facility]) Active Problems:   Neurogenic bladder   Neurogenic bowel   Hypokalemia   Class 1 obesity due to excess calories without serious comorbidity with body mass index (BMI) of 31.0 to 31.9 in adult ADHD Tobacco abuse  Discharged Condition: Stable  Significant Diagnostic Studies: CT Head Wo Contrast  Result Date: 04/09/2019 CLINICAL DATA:  Left-sided neuro deficits EXAM: CT HEAD WITHOUT CONTRAST TECHNIQUE: Contiguous axial images were obtained from the base of the skull through the vertex without intravenous contrast. COMPARISON:  None. FINDINGS: Brain: No evidence of acute infarction, hemorrhage, hydrocephalus, extra-axial collection or mass lesion/mass effect. Vascular: No hyperdense vessel or unexpected calcification. Skull: Normal. Negative for fracture or focal lesion. Sinuses/Orbits: No acute finding. Other: None. IMPRESSION: No acute intracranial pathology. Electronically Signed   By: Lauralyn Primes M.D.   On: 04/09/2019 17:29   MR BRAIN W WO CONTRAST  Result Date: 04/09/2019 CLINICAL DATA:  Ataxia. Weakness and numbness of the left upper and lower extremities. Slurred speech. Double vision. Symptoms for several days. EXAM: MRI HEAD WITHOUT AND WITH CONTRAST MRI CERVICAL SPINE WITHOUT AND WITH CONTRAST TECHNIQUE: Multiplanar, multiecho pulse sequences of the brain and surrounding structures, and cervical spine, to include the craniocervical junction and cervicothoracic junction, were obtained without and with intravenous contrast. CONTRAST:  7.26mL GADAVIST GADOBUTROL 1 MMOL/ML IV SOLN COMPARISON:  Head CT 04/09/2019 FINDINGS: MRI HEAD FINDINGS Brain: There are multiple (15-20) hyperintense T2-weighted signal lesions scattered throughout the cerebral white matter, the  brainstem and the cervical spine. The largest lesions are within the right frontal periventricular white matter. The largest lesion measures 1.9 x 1.1 cm. There is no midline shift. There is mild edema surrounding the larger lesions. Multiple lesions involve the corpus callosum. There is no intracranial hemorrhage or extra-axial collection. Normal appearance of the dura. No hydrocephalus. Vascular: Normal flow voids. Skull: Normal calvarium. Sinuses/Orbits: Clear sinuses. No orbital abnormality visible. Other: None MRI CERVICAL SPINE FINDINGS Alignment: Straightening of normal cervical lordosis but otherwise normal alignment. Vertebrae: Normal marrow signal. Cord: There are multiple edematous, hyperintense T2-weighted signal lesions of the spinal cord, most notably at C2, C3 and C4-5. There is mild contrast enhancement at the C4-5 level. Posterior Fossa, vertebral arteries, paraspinal tissues: Negative. Disc levels: There is no disc herniation, spinal canal stenosis or neural foraminal stenosis. IMPRESSION: 1. 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 2. Multiple lesions within the brainstem may account for reported diplopia due to involvement of gaze coordination pathways. 3. No hemorrhage, midline shift or hydrocephalus. 4. No cervical spinal canal stenosis. Electronically Signed   By: Deatra Robinson M.D.   On: 04/09/2019 23:23   MR CERVICAL SPINE W WO CONTRAST  Result Date: 04/09/2019 CLINICAL DATA:  Ataxia. Weakness and numbness of the left upper and lower extremities. Slurred speech. Double vision. Symptoms for several days. EXAM: MRI HEAD WITHOUT AND WITH CONTRAST MRI CERVICAL SPINE WITHOUT AND WITH CONTRAST TECHNIQUE: Multiplanar, multiecho pulse sequences of the brain and surrounding structures, and cervical spine, to include the craniocervical  junction and cervicothoracic junction, were obtained without and with intravenous contrast. CONTRAST:  7.25mL GADAVIST GADOBUTROL 1 MMOL/ML IV SOLN COMPARISON:  Head CT 04/09/2019 FINDINGS: MRI HEAD FINDINGS Brain: There are multiple (  15-20) hyperintense T2-weighted signal lesions scattered throughout the cerebral white matter, the brainstem and the cervical spine. The largest lesions are within the right frontal periventricular white matter. The largest lesion measures 1.9 x 1.1 cm. There is no midline shift. There is mild edema surrounding the larger lesions. Multiple lesions involve the corpus callosum. There is no intracranial hemorrhage or extra-axial collection. Normal appearance of the dura. No hydrocephalus. Vascular: Normal flow voids. Skull: Normal calvarium. Sinuses/Orbits: Clear sinuses. No orbital abnormality visible. Other: None MRI CERVICAL SPINE FINDINGS Alignment: Straightening of normal cervical lordosis but otherwise normal alignment. Vertebrae: Normal marrow signal. Cord: There are multiple edematous, hyperintense T2-weighted signal lesions of the spinal cord, most notably at C2, C3 and C4-5. There is mild contrast enhancement at the C4-5 level. Posterior Fossa, vertebral arteries, paraspinal tissues: Negative. Disc levels: There is no disc herniation, spinal canal stenosis or neural foraminal stenosis. IMPRESSION: 1. 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 2. Multiple lesions within the brainstem may account for reported diplopia due to involvement of gaze coordination pathways. 3. No hemorrhage, midline shift or hydrocephalus. 4. No cervical spinal canal stenosis. Electronically Signed   By: Ulyses Jarred M.D.   On: 04/09/2019 23:23   DG Chest Port 1 View  Result Date: 04/10/2019 CLINICAL DATA:  Fall.  Weakness. EXAM: PORTABLE CHEST 1  VIEW COMPARISON:  None. FINDINGS: The heart size and mediastinal contours are within normal limits. Both lungs are clear. The visualized skeletal structures are unremarkable. IMPRESSION: No active disease. Electronically Signed   By: Ulyses Jarred M.D.   On: 04/10/2019 01:15    Labs:  Basic Metabolic Panel: Recent Labs  Lab 04/18/19 0511 04/21/19 0658  NA 137 138  K 5.5* 3.5  CL 102 105  CO2 26 24  GLUCOSE 81 84  BUN 15 7  CREATININE 0.92 0.74  CALCIUM 9.2 8.8*    CBC: Recent Labs  Lab 04/18/19 0511 04/21/19 0658  WBC 9.0 5.4  NEUTROABS 3.8 2.4  HGB 16.4* 14.5  HCT 46.9* 40.6  MCV 83.8 83.2  PLT 278 224    CBG: No results for input(s): GLUCAP in the last 168 hours.  Family history.  Maternal grandmother with hyperlipidemia and hypertension.  Paternal grandfather with diabetes and hypertension.  Denies any colon cancer esophageal cancer or rectal cancer  Brief HPI:   Jean Carlson is a 19 y.o. right-handed female with history of ADHD and maintained on Concerta as well as tobacco abuse.  Per chart review lives with grandmother.  1 level home.  Presented 04/09/2019 with double vision and numbness as well as weakness to the left side that progressed over 3 weeks.  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  Multiple lesions within the brainstem.  No hemorrhage midline shift or hydrocephalus.  No cervical spinal canal stenosis.  Admission chemistries unremarkable.  Patient completed a 5-day course of Solu-Medrol.  Therapy evaluations completed and patient was admitted for a comprehensive rehab program   Hospital Course: MARAYAH HIGDON was admitted to rehab 04/17/2019 for inpatient therapies to consist of PT, ST and OT at least three hours five days a week. Past admission physiatrist, therapy team and rehab RN have worked  together to provide customized collaborative inpatient rehab.  Pertaining to patient's demyelinating  disorder she had completed 5-day course of Solu-Medrol.  She would follow-up outpatient neurology services Dr. Epimenio Foot.  SCDs for DVT prophylaxis.  Mood stabilization with Prozac she was attending full therapies.  She did have a history of ADHD maintained on Concerta prior to admission.  Tobacco abuse receiving counsel regards to cessation of nicotine products.  Bouts of urinary urgency some incontinence with dribbling.  No dysuria or hematuria.  Discussion held for possible trial of oxybutynin that she declined at this time.  Bouts of constipation resolved with laxative assistance.   Blood pressures were monitored on TID basis and controlled  /She did have some stress urgency incontinence  Francis Dowse has made gains during rehab stay and is attending therapies  Francis Dowse will continue to receive follow up therapies   after discharge  Rehab course: During patient's stay in rehab weekly team conferences were held to monitor patient's progress, set goals and discuss barriers to discharge. At admission, patient required minimum assist ambulate 80 feet rolling walker, minimal assist sit to stand, supervision sit to supine.  Set up upper body bathing minimal assist lower body bathing minimal assist upper body dressing minimal assist lower body dressing  Physical exam.  Blood pressure 96/62 pulse 82 temperature 98.4 respirations 18 oxygen saturations 100% room air Constitutional.  Alert and oriented well-developed HEENT Head.  Normocephalic and atraumatic Eyes.  Pupils round and reactive to light no discharge without nystagmus Neck.  Supple nontender no JVD without thyromegaly Cardiac regular rate rhythm without any extra sounds or murmur heard Abdomen.  Soft nontender positive bowel sounds without rebound Respiratory effort normal no respiratory distress without wheeze Musculoskeletal. Cervical back normal range  of motion Neurological alert and oriented tracks all fields gaze slightly disconjugate when looking right positive diplopia with rightward gaze.  Mild left PD.  Strength 5 out of 5 bilateral upper extremities right lower extremity 5 out of 5 left lower extremity 4+ out of 5 decreased light touch left lower extremity.  Francis Dowse  has had improvement in activity tolerance, balance, postural control as well as ability to compensate for deficits. Francis Dowse has had improvement in functional use RUE/LUE  and RLE/LLE as well as improvement in awareness.  Working with energy conservation.  Patient performs bed mobility independently transferred to sitting and then standing independently.  Ambulates throughout the room independently ambulates to the sink and to brush her hair brushes her teeth.  Ambulates greater than 300 feet independently without assistive device.  Performs car transfers modified independent.  Ambulates across unlevel surfaces modified independent.  Gather his belongings for activities of daily living and homemaking.  Full teaching completed plan discharge to home       Disposition: Discharge to home    Diet: Regular  Special Instructions: No driving smoking or alcohol  Medications at discharge 1.  Tylenol as needed 2.  Dulcolax tablet daily 3.  Prozac 40 mg p.o. daily 4.  Concerta 36 mg p.o. daily    Follow-up Information    Lovorn, Aundra Millet, MD Follow up.   Specialty: Physical Medicine and Rehabilitation Why: Office to call for appointment Contact information: [address] [place] [address] [phone]        Asa Lente, MD Follow up.   Specialty: Neurology Why: Call for appointment Contact information: [address] [place] [address] [phone]           Signed: Charlton Amor 04/24/2019, 5:14 AM

## 2019-04-24 LAB — BASIC METABOLIC PANEL
Anion gap: 11 (ref 5–15)
BUN: 7 mg/dL (ref 6–20)
CO2: 24 mmol/L (ref 22–32)
Calcium: 9 mg/dL (ref 8.9–10.3)
Chloride: 104 mmol/L (ref 98–111)
Creatinine, Ser: 0.74 mg/dL (ref 0.44–1.00)
GFR calc Af Amer: >60 mL/min (ref >60)
GFR calc non Af Amer: >60 mL/min (ref >60)
Glucose, Bld: 81 mg/dL (ref 70–99)
Potassium: 3.8 mmol/L (ref 3.5–5.1)
Sodium: 139 mmol/L (ref 135–145)

## 2019-04-24 LAB — CBC WITH DIFFERENTIAL/PLATELET
Abs Immature Granulocytes: 0.02 10*3/uL (ref 0.00–0.07)
Basophils Absolute: 0 10*3/uL (ref 0.0–0.1)
Basophils Relative: 0 %
Eosinophils Absolute: 0.1 10*3/uL (ref 0.0–0.5)
Eosinophils Relative: 2 %
HCT: 40.4 % (ref 36.0–46.0)
Hemoglobin: 14.2 g/dL (ref 12.0–15.0)
Immature Granulocytes: 0 %
Lymphocytes Relative: 31 %
Lymphs Abs: 1.9 10*3/uL (ref 0.7–4.0)
MCH: 29 pg (ref 26.0–34.0)
MCHC: 35.1 g/dL (ref 30.0–36.0)
MCV: 82.4 fL (ref 80.0–100.0)
Monocytes Absolute: 0.7 10*3/uL (ref 0.1–1.0)
Monocytes Relative: 11 %
Neutro Abs: 3.5 10*3/uL (ref 1.7–7.7)
Neutrophils Relative %: 56 %
Platelets: 226 10*3/uL (ref 150–400)
RBC: 4.9 MIL/uL (ref 3.87–5.11)
RDW: 11.9 % (ref 11.5–15.5)
WBC: 6.2 10*3/uL (ref 4.0–10.5)
nRBC: 0 % (ref 0.0–0.2)

## 2019-04-24 MED ORDER — METHYLPHENIDATE HCL ER (OSM) 36 MG PO TBCR: 36.0000 mg | EXTENDED_RELEASE_TABLET | Freq: Every day | ORAL | 0 refills | Status: DC

## 2019-04-24 MED ORDER — ACETAMINOPHEN 325 MG PO TABS: 650.0000 mg | ORAL_TABLET | Freq: Four times a day (QID) | ORAL | Status: DC | PRN

## 2019-04-24 MED ORDER — BISACODYL 5 MG PO TBEC: 10.0000 mg | DELAYED_RELEASE_TABLET | Freq: Every day | ORAL | 0 refills | Status: DC

## 2019-04-24 MED ORDER — FLUOXETINE HCL 40 MG PO CAPS: 40.0000 mg | ORAL_CAPSULE | Freq: Every morning | ORAL | 3 refills | Status: DC

## 2019-04-24 NOTE — Progress Notes (Signed)
PA has been in to see patient. Pt was discharged . All education and everything needed has been completed.

## 2019-04-24 NOTE — Progress Notes (Signed)
Social Work Discharge Note   The overall goal for the admission was met for:   Discharge location: Yes, discharged home with grandmother  Length of Stay: Yes, 7 days  Discharge activity level: Yes, Independent   Home/community participation: Yes. Limited  Services provided included: MD, RD, PT, OT, SLP, RN, CM, TR, Pharmacy, Neuropsych and SW  Financial Services: Private Insurance: [employer]  Follow-up services arranged: Outpatient: [facility] for PT/OT/STeval  Comments (or additional information): Pt mother/father [phone]  Patient/Family verbalized understanding of follow-up arrangements: Yes  Individual responsible for coordination of the follow-up plan: Pt mother, grandmother and father to coordinate follow-up needs.  Confirmed correct DME delivered: Dyanne Iha 04/24/2019    Dyanne Iha

## 2019-04-24 NOTE — Progress Notes (Signed)
Social Work Patient ID: Jean Carlson, female   DOB: [birth year], 19 y.o.   MRN: [id number]   Pt and grandmother updated SW on 04/21/19 that her mother declined shower chair order, will follow up with family on Monday about reordering if necessary.

## 2019-04-24 NOTE — Discharge Instructions (Signed)
Inpatient Rehab Discharge Instructions  Jean Carlson Discharge date and time: No discharge date for patient encounter.   Activities/Precautions/ Functional Status: Activity: activity as tolerated Diet: regular diet Wound Care: none needed Functional status:  ___ No restrictions     ___ Walk up steps independently ___ 24/7 supervision/assistance   ___ Walk up steps with assistance ___ Intermittent supervision/assistance  ___ Bathe/dress independently ___ Walk with walker     ___ Bathe/dress with assistance ___ Walk Independently    ___ Shower independently ___ Walk with assistance    ___ Shower with assistance ___ No alcohol     ___ Return to work/school ________  COMMUNITY REFERRALS UPON DISCHARGE:    Outpatient: PT    OT    ST                 Agency: [facility] Phone: [phone]              Appointment Date/Time: April 1st at 8:00 AM  Medical Equipment/Items Ordered: DME ordered, family declined Public relations account executive)                                                 Agency/Supplier:  [employer] [phone]    Special Instructions: No driving smoking or alcohol   My questions have been answered and I understand these instructions. I will adhere to these goals and the provided educational materials after my discharge from the hospital.  Patient/Caregiver Signature _______________________________ Date __________  Clinician Signature _______________________________________ Date __________  Please bring this form and your medication list with you to all your follow-up doctor's appointments.

## 2019-04-26 ENCOUNTER — Telehealth: Payer: Self-pay | Admitting: Registered Nurse

## 2019-04-26 NOTE — Telephone Encounter (Signed)
Transitional Care call Transitional Questions answered by Mother [name]  Patient name: Jean Carlson  DOB: September 16, 2000 1. Are you/is patient experiencing any problems since coming home? No a. Are there any questions regarding any aspect of care? No 2. Are there any questions regarding medications administration/dosing? No a. Are meds being taken as prescribed? Yes b. "Patient should review meds with caller to confirm" Medication List Reviewed.  3. Have there been any falls? No 4. Has Home Health been to the house and/or have they contacted you? She has appointments scheduled with Neuro-Rehabilitation.  a. If not, have you tried to contact them? NA b. Can we help you contact them? No 5. Are bowels and bladder emptying properly? Yes a. Are there any unexpected incontinence issues? No b. If applicable, is patient following bowel/bladder programs? NA 6. Any fevers, problems with breathing, unexpected pain? No 7. Are there any skin problems or new areas of breakdown? No 8. Has the patient/family member arranged specialty MD follow up (ie cardiology/neurology/renal/surgical/etc.)?  HFU appointments are scheduled. a. Can we help arrange? NA 9. Does the patient need any other services or support that we can help arrange? No 10. Are caregivers following through as expected in assisting the patient? Yes 11. Has the patient quit smoking, drinking alcohol, or using drugs as recommended? (                        )  Appointment date/time 05/05/2019  arrival time 1:40 for 2:00 appointment with Dr. Berline Chough at [phone]

## 2019-04-26 NOTE — Telephone Encounter (Signed)
Placed a call to Ms. Joubert mother, no answer. Left message to return the call.

## 2019-04-27 ENCOUNTER — Encounter: Payer: BC Managed Care – PPO | Admitting: Occupational Therapy

## 2019-04-27 ENCOUNTER — Ambulatory Visit: Payer: BC Managed Care – PPO | Attending: Physician Assistant | Admitting: Physical Therapy

## 2019-04-27 ENCOUNTER — Other Ambulatory Visit: Payer: Self-pay

## 2019-04-27 ENCOUNTER — Encounter: Payer: Self-pay | Admitting: Physical Therapy

## 2019-04-27 DIAGNOSIS — R2689 Other abnormalities of gait and mobility: Secondary | ICD-10-CM

## 2019-04-27 DIAGNOSIS — R278 Other lack of coordination: Secondary | ICD-10-CM | POA: Insufficient documentation

## 2019-04-27 DIAGNOSIS — R2681 Unsteadiness on feet: Secondary | ICD-10-CM | POA: Diagnosis not present

## 2019-04-27 DIAGNOSIS — R296 Repeated falls: Secondary | ICD-10-CM

## 2019-04-27 DIAGNOSIS — R29818 Other symptoms and signs involving the nervous system: Secondary | ICD-10-CM | POA: Diagnosis not present

## 2019-04-27 DIAGNOSIS — R471 Dysarthria and anarthria: Secondary | ICD-10-CM | POA: Insufficient documentation

## 2019-04-27 DIAGNOSIS — M6281 Muscle weakness (generalized): Secondary | ICD-10-CM

## 2019-04-27 DIAGNOSIS — R41844 Frontal lobe and executive function deficit: Secondary | ICD-10-CM | POA: Insufficient documentation

## 2019-04-27 DIAGNOSIS — R41840 Attention and concentration deficit: Secondary | ICD-10-CM | POA: Insufficient documentation

## 2019-04-27 NOTE — Therapy (Signed)
[facility] [address] [place], [place], [address] Phone: [phone]   Fax:  [phone]  Physical Therapy Evaluation  Patient Details  Name: Jean Carlson MRN: [id number] Date of Birth: 2000-09-16 Referring Provider (PT): Charlton Amor, PA-C   Encounter Date: 04/27/2019  PT End of Session - 04/27/19 2133    Visit Number  1    Number of Visits  17    Date for PT Re-Evaluation  06/26/19    Authorization Type  BCBS    PT Start Time  0800    PT Stop Time  0853    PT Time Calculation (min)  53 min    Activity Tolerance  Patient tolerated treatment well    Behavior During Therapy  [facility] for tasks assessed/performed       Past Medical History:  Diagnosis Date  . ADHD     History reviewed. No pertinent surgical history.  There were no vitals filed for this visit.   Subjective Assessment - 04/27/19 0807    Subjective  Pt reports first symptoms began with impaired balance, speech difficulties and nystagmus.  Discharged home on Monday from [facility].  Pt reports ongoing weakness in LLE.  No falls since being home.  Still having issues with LUE coordination and speech issues.  Pt and mother have not noticed any cognitive issues.   Having some issues with peripheral vision but no longer having diplopia.    Patient is accompained by:  Family member    Pertinent History  ADHD    Patient Stated Goals  Play softball, play with the dog    Currently in Pain?  No/denies         [facility] PT Assessment - 04/27/19 0813      Assessment   Medical Diagnosis  MS    Referring Provider (PT)  Charlton Amor, PA-C    Onset Date/Surgical Date  04/20/19    Hand Dominance  Right    Next MD Visit  next week    Prior Therapy  [facility]      Precautions   Precautions  Other (comment)    Precaution Comments  ADHD      Balance Screen   Has the patient fallen in the past 6 months  Yes    How many times?  5-6    Has the patient had a  decrease in activity level because of a fear of falling?   Yes      Home Environment   Living Environment  Private residence    Living Arrangements  Parent    Available Help at Discharge  Family    Type of Home  House    Home Access  Stairs to enter;Ramped entrance    Entrance Stairs-Number of Steps  4    Entrance Stairs-Rails  Right    Home Layout  One level    Home Equipment  None    Additional Comments  mother is a Naval architect, is on the road for weeks at a time; grandmother will bring to sessions when mother is working      Prior Function   Level of Ship broker courses for college - career diploma, Chief Operating Officer degree [employer]    Leisure  play with dog, softball      Observation/Other Assessments   Focus on Therapeutic Outcomes (FOTO)   N/A      Sensation  Light Touch  Impaired Detail    Additional Comments  tingling in finger tips; more sensitive to temperatures      Coordination   Heel Shin Test  mild ataxia      Tone   Assessment Location  Right Lower Extremity;Left Lower Extremity      ROM / Strength   AROM / PROM / Strength  Strength      Strength   Overall Strength  Deficits    Overall Strength Comments  RLE: 4+/5, LLE: 4-/5      Ambulation/Gait   Ambulation/Gait  Yes    Ambulation/Gait Assistance  6: Modified independent (Device/Increase time)    Ambulation Distance (Feet)  230 Feet    Assistive device  None    Gait Pattern  Step-through pattern;Decreased stance time - left;Right genu recurvatum;Left genu recurvatum    Ambulation Surface  Level;Indoor    Stairs  Yes    Stairs Assistance  6: Modified independent (Device/Increase time)    Stair Management Technique  No rails;Alternating pattern;Forwards    Number of Stairs  4    Height of Stairs  6      Standardized Balance Assessment   Standardized Balance Assessment  Berg Balance Test      Berg Balance Test   Sit to Stand  Able to  stand without using hands and stabilize independently    Standing Unsupported  Able to stand safely 2 minutes    Sitting with Back Unsupported but Feet Supported on Floor or Stool  Able to sit safely and securely 2 minutes    Stand to Sit  Sits safely with minimal use of hands    Transfers  Able to transfer safely, minor use of hands    Standing Unsupported with Eyes Closed  Able to stand 10 seconds safely    Standing Unsupported with Feet Together  Able to place feet together independently and stand 1 minute safely    From Standing, Reach Forward with Outstretched Arm  Can reach confidently >25 cm (10")    From Standing Position, Pick up Object from Floor  Able to pick up shoe safely and easily    From Standing Position, Turn to Look Behind Over each Shoulder  Looks behind from both sides and weight shifts well    Turn 360 Degrees  Able to turn 360 degrees safely in 4 seconds or less    Standing Unsupported, Alternately Place Feet on Step/Stool  Able to complete 4 steps without aid or supervision    Standing Unsupported, One Foot in Front  Able to plae foot ahead of the other independently and hold 30 seconds    Standing on One Leg  Able to lift leg independently and hold > 10 seconds    Total Score  53    Berg comment:  53/56      Functional Gait  Assessment   Gait assessed   Yes    Gait Level Surface  Walks 20 ft in less than 7 sec but greater than 5.5 sec, uses assistive device, slower speed, mild gait deviations, or deviates 6-10 in outside of the 12 in walkway width.    Change in Gait Speed  Able to change speed, demonstrates mild gait deviations, deviates 6-10 in outside of the 12 in walkway width, or no gait deviations, unable to achieve a major change in velocity, or uses a change in velocity, or uses an assistive device.    Gait with Horizontal Head Turns  Performs head turns  smoothly with slight change in gait velocity (eg, minor disruption to smooth gait path), deviates 6-10 in  outside 12 in walkway width, or uses an assistive device.    Gait with Vertical Head Turns  Performs task with slight change in gait velocity (eg, minor disruption to smooth gait path), deviates 6 - 10 in outside 12 in walkway width or uses assistive device    Gait and Pivot Turn  Pivot turns safely within 3 sec and stops quickly with no loss of balance.    Step Over Obstacle  Is able to step over 2 stacked shoe boxes taped together (9 in total height) without changing gait speed. No evidence of imbalance.    Gait with Narrow Base of Support  Ambulates 4-7 steps.    Gait with Eyes Closed  Walks 20 ft, no assistive devices, good speed, no evidence of imbalance, normal gait pattern, deviates no more than 6 in outside 12 in walkway width. Ambulates 20 ft in less than 7 sec.    Ambulating Backwards  Walks 20 ft, no assistive devices, good speed, no evidence for imbalance, normal gait    Steps  Alternating feet, no rail.    Total Score  24    FGA comment:  24/30      RLE Tone   RLE Tone  Mild;Modified Ashworth      RLE Tone   Modified Ashworth Scale for Grading Hypertonia RLE  Slight increase in muscle tone, manifested by a catch, followed by minimal resistance throughout the remainder (less than half) of the ROM      LLE Tone   LLE Tone  Mild;Modified Ashworth      LLE Tone   Modified Ashworth Scale for Grading Hypertonia LLE  Slight increase in muscle tone, manifested by a catch, followed by minimal resistance throughout the remainder (less than half) of the ROM                Objective measurements completed on examination: See above findings.              PT Education - 04/27/19 2132    Education Details  clinical findings, PT POC, goals, aquatic therapy    Person(s) Educated  Patient;Parent(s)    Methods  Explanation    Comprehension  Verbalized understanding       PT Short Term Goals - 04/27/19 2140      PT SHORT TERM GOAL #1   Title  Pt will initiate and  demonstrate compliance with land based HEP.    Time  4    Period  Weeks    Status  New    Target Date  05/27/19      PT SHORT TERM GOAL #2   Title  Pt will improve BERG balance to 56/56    Baseline  53/56    Time  4    Period  Weeks    Status  New    Target Date  05/27/19      PT SHORT TERM GOAL #3   Title  Pt will improve FGA to 28/30    Baseline  24/30    Time  4    Period  Weeks    Status  New    Target Date  05/27/19        PT Long Term Goals - 04/27/19 2143      PT LONG TERM GOAL #1   Title  Pt will demonstrate compliance with land and aquatic HEP  Time  8    Period  Weeks    Status  New    Target Date  06/26/19      PT LONG TERM GOAL #2   Title  Pt will improve FGA to 30/30 to indicate low falls risk    Time  8    Period  Weeks    Status  New    Target Date  06/26/19      PT LONG TERM GOAL #3   Title  Pt will demonstrate ability to transfer safely floor <> stand MOD I multiple times    Time  8    Period  Weeks    Status  New    Target Date  06/26/19      PT LONG TERM GOAL #4   Title  Pt will demonstrate ability to catch/throw softball and jog in grass x 200' without LOB    Time  8    Period  Weeks    Status  New    Target Date  06/26/19             Plan - 04/27/19 2135    Clinical Impression Statement  Pt is a 19 year old female referred to Neuro OPPT for evaluation of new diagnosis of demyelinating disease of the central nervous system/multiple sclerosis.  Pt's PMH is significant for the following: ADHD. The following deficits were noted during pt's exam: mild hypertonicity, impaired strength in bilat LE L > R weakness, impaired balance and impaired gait.  Pt's BERG and FGA score indicates pt is at low risk for falls. Pt would benefit from skilled PT to address these impairments and functional limitations to maximize functional mobility independence and reduce falls risk.    Personal Factors and Comorbidities  Comorbidity 1    Comorbidities   ADHD    Examination-Activity Limitations  Locomotion Level;Stairs    Examination-Participation Restrictions  Community Activity;Driving    Stability/Clinical Decision Making  Stable/Uncomplicated    Clinical Decision Making  Low    Rehab Potential  Excellent    PT Frequency  2x / week    PT Duration  8 weeks    PT Treatment/Interventions  ADLs/Self Care Home Management;Aquatic Therapy;Electrical Stimulation;Gait training;Stair training;Functional mobility training;Therapeutic activities;Therapeutic exercise;Balance training;Neuromuscular re-education;Patient/family education;Orthotic Fit/Training    PT Next Visit Plan  Go over aquatic information sheet; initiate HEP focusing on high level balance training and LE strengthening; balance on compliant surfaces.  Softball outside on grass.  Trial Bioness for strengthening.    Recommended Other Services  aquatic, Bioness    Consulted and Agree with Plan of Care  Patient;Family member/caregiver    Family Member Consulted  Mother       Patient will benefit from skilled therapeutic intervention in order to improve the following deficits and impairments:  Abnormal gait, Decreased balance, Decreased coordination, Decreased strength, Difficulty walking, Impaired tone  Visit Diagnosis: Repeated falls  Other symptoms and signs involving the nervous system  Other abnormalities of gait and mobility  Unsteadiness on feet  Muscle weakness (generalized)     Problem List Patient Active Problem List   Diagnosis Date Noted  . Class 1 obesity due to excess calories without serious comorbidity with body mass index (BMI) of 31.0 to 31.9 in adult   . Neurogenic bowel   . Hypokalemia   . Neurogenic bladder 04/18/2019  . Demyelinating disorder ([place]) 04/12/2019  . ADHD 04/12/2019  . MS (multiple sclerosis) ([place]) 04/10/2019  . Tobacco abuse 04/10/2019  Dierdre Highman, PT, DPT 04/27/19    9:47 PM    [facility] [address] [place], [place], [address] Phone: [phone]   Fax:  [phone]  Name: Jean Carlson MRN: [id number] Date of Birth: 2000-11-02

## 2019-05-02 ENCOUNTER — Other Ambulatory Visit: Payer: Self-pay

## 2019-05-02 ENCOUNTER — Ambulatory Visit: Payer: BC Managed Care – PPO | Admitting: Occupational Therapy

## 2019-05-02 ENCOUNTER — Encounter: Payer: Self-pay | Admitting: Physical Therapy

## 2019-05-02 ENCOUNTER — Ambulatory Visit: Payer: BC Managed Care – PPO | Admitting: Physical Therapy

## 2019-05-02 DIAGNOSIS — R2689 Other abnormalities of gait and mobility: Secondary | ICD-10-CM | POA: Diagnosis not present

## 2019-05-02 DIAGNOSIS — R2681 Unsteadiness on feet: Secondary | ICD-10-CM | POA: Diagnosis not present

## 2019-05-02 DIAGNOSIS — R29818 Other symptoms and signs involving the nervous system: Secondary | ICD-10-CM | POA: Diagnosis not present

## 2019-05-02 DIAGNOSIS — R296 Repeated falls: Secondary | ICD-10-CM | POA: Diagnosis not present

## 2019-05-02 DIAGNOSIS — R278 Other lack of coordination: Secondary | ICD-10-CM | POA: Diagnosis not present

## 2019-05-02 DIAGNOSIS — M6281 Muscle weakness (generalized): Secondary | ICD-10-CM | POA: Diagnosis not present

## 2019-05-02 DIAGNOSIS — R471 Dysarthria and anarthria: Secondary | ICD-10-CM | POA: Diagnosis not present

## 2019-05-02 DIAGNOSIS — R41844 Frontal lobe and executive function deficit: Secondary | ICD-10-CM | POA: Diagnosis not present

## 2019-05-02 DIAGNOSIS — R41840 Attention and concentration deficit: Secondary | ICD-10-CM

## 2019-05-02 NOTE — Patient Instructions (Signed)
Access Code: [email or web address] URL: [email or web address] Date: 05/02/2019 Prepared by: Modena Morrow  Exercises Single Leg Stance - 2 x daily - 7 x weekly - 3 reps - 10 seconds hold Braided Sidestepping - 1 x daily - 7 x weekly - 4 sets - 10 reps Forward Backward Tandem Walking - 1 x daily - 7 x weekly - 4 sets - 10 reps Step Taps on High Step - 1 x daily - 7 x weekly - 10 reps Seated Ankle Eversion with Resistance - 1 x daily - 7 x weekly - 10 reps - 3 sets Seated Ankle Dorsiflexion with Resistance - 1 x daily - 7 x weekly - 10 reps - 3 sets

## 2019-05-02 NOTE — Therapy (Addendum)
[facility] [address] [place], [place], [address] Phone: [phone]   Fax:  [phone]  Occupational Therapy Evaluation  Patient Details  Name: Jean Carlson MRN: [id number] Date of Birth: 2000/12/12 Referring Provider (OT): Dr. Berline Chough   Encounter Date: 05/02/2019  OT End of Session - 05/02/19 1015    Visit Number  1    Number of Visits  17    Date for OT Re-Evaluation  07/01/19    Authorization Type  BCBS    OT Start Time  0805    OT Stop Time  0842    OT Time Calculation (min)  37 min    Activity Tolerance  Patient tolerated treatment well    Behavior During Therapy  [facility] for tasks assessed/performed       Past Medical History:  Diagnosis Date  . ADHD     Past Surgical History:  Procedure Laterality Date  . WISDOM TOOTH EXTRACTION     x4    There were no vitals filed for this visit.  Subjective Assessment - 05/02/19 0806    Subjective   Pt reports decreased LUE coordination    Pertinent History  demylenating disease/ MS, ADHD, Depression, tobacco use    Patient Stated Goals  improve left hand coordination    Currently in Pain?  No/denies                           OT Short Term Goals - 05/02/19 1000      OT SHORT TERM GOAL #1   Title  I with HEP for coordination and UE strength-06/01/19    Time  4    Period  Weeks    Status  New    Target Date  06/01/19      OT SHORT TERM GOAL #2   Title  Pt will verbalize understanding of energy conservation techniques    Time  4    Period  Weeks    Status  New      OT SHORT TERM GOAL #3   Title  Pt will verbalize understanding of compensatory strategies for short term memory and cognitive deficits.    Time  4    Period  Weeks    Status  New      OT SHORT TERM GOAL #4   Title  Pt will verbalize understanding of benefits of a routine/ daily schedule and utilize modified independently    Time  4    Period  Weeks    Status  New       OT SHORT TERM GOAL #5   Title  Check grip strength and set goal prn    Time  4    Period  Weeks    Status  New      OT SHORT TERM GOAL #6   Title  --    Time  --    Period  --    Status  --        OT Long Term Goals - 05/02/19 1003      OT LONG TERM GOAL #1   Title  Pt will demonstrate ability to perform a cognitive task and physical task simultaneously with 90% or better accuracy in prep for driving.    Time  8    Period  Weeks    Status  New    Target Date  07/01/19      OT LONG TERM  GOAL #2   Title  Pt will demonstrate improved fine motor coordination for ADLS as evidenced by decreasing 9 hole peg test score to 35 secs or less    Baseline  RUE 27.85, LUE 39.19    Time  8    Period  Weeks    Status  New      OT LONG TERM GOAL #3   Title  Pt will demonstrate improved LUE functional use for ADLs as evidenced by increasing box/ blocks score to 48 blocks or greater.    Baseline  RUE 53, LUE 43    Time  8    Period  Weeks    Status  New      OT LONG TERM GOAL #4   Title  Pt will verbalize understanding of MS related resources    Time  8    Period  Weeks    Status  New      OT LONG TERM GOAL #5   Title  Pt will perform simple stove top cooking task modified I demonstrating good safety awareness.    Time  4    Period  Weeks    Status  New            Plan - 05/02/19 0946    Clinical Impression Statement  Patient is a 19 y.o. year old female with history of ADHD and tobacco abuse.  Pt. presented 04/09/2019 with double vision and numbness as well as weakness to the left side that progressed over the last 3 weeks.  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  Multiple lesions within the brainstem. Patient completed a 5-day course of Solu-Medrol.  Patient transferred to [facility] on 04/17/2019, and pt d/c home with family  04/24/19 .  Pt presents with the following deficits: decreased coordination, decreased strength, decreased ROM, decreased LUE functional use, cognitive deficits, decreased balance and functional mobility which impedes performance of ADLs/ IADLS. Pt can benefit from skilled occupational therapy to address these deficits in order to maximize pt's safety and independence with daily activities.    OT Occupational Profile and History  Detailed Assessment- Review of Records and additional review of physical, cognitive, psychosocial history related to current functional performance    Occupational performance deficits (Please refer to evaluation for details):  ADL's;IADL's;Leisure;Social Participation;Education    Body Structure / Function / Physical Skills  ADL;Strength;Mobility;Balance;UE functional use;FMC;Coordination;Gait;GMC;Decreased knowledge of precautions;Decreased knowledge of use of DME;Dexterity;IADL;Sensation    Cognitive Skills  Attention;Energy/Drive;Memory;Safety Awareness;Sequencing;Thought    Rehab Potential  Good    Clinical Decision Making  Limited treatment options, no task modification necessary    Comorbidities Affecting Occupational Performance:  May have comorbidities impacting occupational performance    Modification or Assistance to Complete Evaluation   No modification of tasks or assist necessary to complete eval    OT Frequency  2x / week    OT Duration  8 weeks    OT Treatment/Interventions  Self-care/ADL training;Therapeutic exercise;Functional Mobility Training;Balance training;Manual Therapy;Neuromuscular education;Aquatic Therapy;Energy conservation;Therapeutic activities;Cognitive remediation/compensation;Cryotherapy;Paraffin;Visual/perceptual remediation/compensation;Fluidtherapy;Moist Heat;Passive range of motion;Patient/family education    Plan  HEP for fine motor coordination, test grip strength and set goal prn    Consulted and Agree with Plan of Care  Patient;Family  member/caregiver    Family Member Consulted  mother       Patient will benefit from skilled therapeutic intervention in order to improve the following deficits  and impairments:   Body Structure / Function / Physical Skills: ADL, Strength, Mobility, Balance, UE functional use, FMC, Coordination, Gait, GMC, Decreased knowledge of precautions, Decreased knowledge of use of DME, Dexterity, IADL, Sensation Cognitive Skills: Attention, Energy/Drive, Memory, Safety Awareness, Sequencing, Thought     Visit Diagnosis: Muscle weakness (generalized) - Plan: Ot plan of care cert/re-cert  Other lack of coordination - Plan: Ot plan of care cert/re-cert  Attention and concentration deficit - Plan: Ot plan of care cert/re-cert  Frontal lobe and executive function deficit - Plan: Ot plan of care cert/re-cert  Unsteadiness on feet - Plan: Ot plan of care cert/re-cert    Problem List Patient Active Problem List   Diagnosis Date Noted  . High risk medication use 05/03/2019  . Vitamin D deficiency 05/03/2019  . Depression with anxiety 05/03/2019  . Class 1 obesity due to excess calories without serious comorbidity with body mass index (BMI) of 31.0 to 31.9 in adult   . Neurogenic bowel   . Hypokalemia   . Neurogenic bladder 04/18/2019  . Demyelinating disorder ([facility]) 04/12/2019  . ADHD 04/12/2019  . Multiple sclerosis ([facility]) 04/10/2019  . Tobacco abuse 04/10/2019    Ciara Kagan 05/03/2019, 1:44 PM  [facility] [address] [place], [place], [address] Phone: [phone]   Fax:  [phone]  Name: Jean Carlson MRN: [id number] Date of Birth: 2000-07-24

## 2019-05-03 ENCOUNTER — Ambulatory Visit: Payer: BC Managed Care – PPO | Admitting: Neurology

## 2019-05-03 ENCOUNTER — Ambulatory Visit: Payer: BC Managed Care – PPO | Admitting: Physical Therapy

## 2019-05-03 ENCOUNTER — Encounter: Payer: Self-pay | Admitting: Neurology

## 2019-05-03 ENCOUNTER — Telehealth: Payer: Self-pay | Admitting: *Deleted

## 2019-05-03 VITALS — BP 119/73 | HR 85 | Temp 97.4°F | Ht 64.0 in | Wt 186.0 lb

## 2019-05-03 DIAGNOSIS — Z0001 Encounter for general adult medical examination with abnormal findings: Secondary | ICD-10-CM | POA: Diagnosis not present

## 2019-05-03 DIAGNOSIS — G35 Multiple sclerosis: Secondary | ICD-10-CM

## 2019-05-03 DIAGNOSIS — R41844 Frontal lobe and executive function deficit: Secondary | ICD-10-CM | POA: Diagnosis not present

## 2019-05-03 DIAGNOSIS — R471 Dysarthria and anarthria: Secondary | ICD-10-CM | POA: Diagnosis not present

## 2019-05-03 DIAGNOSIS — Z5181 Encounter for therapeutic drug level monitoring: Secondary | ICD-10-CM | POA: Diagnosis not present

## 2019-05-03 DIAGNOSIS — R41840 Attention and concentration deficit: Secondary | ICD-10-CM | POA: Diagnosis not present

## 2019-05-03 DIAGNOSIS — R296 Repeated falls: Secondary | ICD-10-CM

## 2019-05-03 DIAGNOSIS — Z0389 Encounter for observation for other suspected diseases and conditions ruled out: Secondary | ICD-10-CM | POA: Diagnosis not present

## 2019-05-03 DIAGNOSIS — E559 Vitamin D deficiency, unspecified: Secondary | ICD-10-CM

## 2019-05-03 DIAGNOSIS — R29818 Other symptoms and signs involving the nervous system: Secondary | ICD-10-CM | POA: Diagnosis not present

## 2019-05-03 DIAGNOSIS — M6281 Muscle weakness (generalized): Secondary | ICD-10-CM

## 2019-05-03 DIAGNOSIS — R2681 Unsteadiness on feet: Secondary | ICD-10-CM | POA: Diagnosis not present

## 2019-05-03 DIAGNOSIS — R278 Other lack of coordination: Secondary | ICD-10-CM | POA: Diagnosis not present

## 2019-05-03 DIAGNOSIS — F418 Other specified anxiety disorders: Secondary | ICD-10-CM

## 2019-05-03 DIAGNOSIS — F909 Attention-deficit hyperactivity disorder, unspecified type: Secondary | ICD-10-CM

## 2019-05-03 DIAGNOSIS — R2689 Other abnormalities of gait and mobility: Secondary | ICD-10-CM

## 2019-05-03 DIAGNOSIS — N319 Neuromuscular dysfunction of bladder, unspecified: Secondary | ICD-10-CM | POA: Diagnosis not present

## 2019-05-03 DIAGNOSIS — Z79899 Other long term (current) drug therapy: Secondary | ICD-10-CM | POA: Diagnosis not present

## 2019-05-03 DIAGNOSIS — G35D Multiple sclerosis, unspecified: Secondary | ICD-10-CM

## 2019-05-03 NOTE — Progress Notes (Signed)
[facility]  PATIENT: Jean Carlson DOB: [birth year]  REFERRING DOCTOR OR PCP:  Milus Height, PA SOURCE: Patient, notes from [facility], imaging and lab reports, MRI images personally reviewed.  _________________________________   HISTORICAL  CHIEF COMPLAINT:  Chief Complaint  Patient presents with  . New Patient (Initial Visit)    RM 74 with mother (temp:96.9). Internal referral from Milus Height, [place] for MS.  Was at [facility] 04/17/19-04/24/19.  Had MRI completed at [facility], can be viewed in Epic. Received 5 days IV steroids while in the hospital.  No family history of MS. Doing outpt therapy at [facility] next door. She is doing PT/OT/ST 2x/week. For PT she alternates and does one visit in the office and one at the pool. She will do a total of 8 wks of therapy.   . Multiple Sclerosis    Reports that she has tremor in left hand/head. Mother has noticed short term memory issues, some issues with balance still but has improved a lot since she was in the hospital. Has issues with peripheral vision in right eye.   [name] Anxiety    Mother reports that she has a lot of anxiety. They previously had her on cannabis. Wanting to see if Dr. Epimenio Foot will prescribe this. Advised he does not but they can discuss further with MD. Advised there are other options to help manage anxiety.     HISTORY OF PRESENT ILLNESS:  I had the pleasure seeing your patient, Jean Carlson, at the MS center at [facility] for neurologic consultation regarding her recent diagnosis of multiple sclerosis.  She is a 19 year old woman who had the onset of slurred speech,left hand tremor and clumsiness, abnormal eye movements on the right and urinary incontinence.   She went to the ED.    She was admitted and received 5 days of IV Solu-Medrol.   While in the hospital she started to note improvement and she has continued to improve.    In retrospect, in February, she had vertigo x 3  weeks and had several falls, one hyperextending her left knee.   She saw orthopedics.   Also in late 2020, she had a couple weeks of fluctuating numbness in her left hand.    Currently, she is doing better.   She feels her gait is almost back to baseline.  She can go up and down stairs without holding the bannister.    The left knee still bothers her.   She notes mild left hand weakness and numbness.     She notes diplopia when she looks far right but this is much better.   She has no facial numbness.      Her bladder is doing better now with resolution of the urgency and frequency.    She has no nocturia.  Her mom notes that Buffey's short term memory is worse.   She also has reduced attention (has ADD) and is more distractible.    Her sleep is good thought she had insomnia with steroids.    She is not experiencing much fatigue.  She has had issues with depression and anxiety in the past.     She is on methylphenidate 36 mg and has been on a while.  She did worse on Vyvanse in the past.   She also is on Prozac 40 mg.      I personally reviewed the MRIs of the brain and cervical spine dated 04/09/2019.  The MRI of the  brain shows multiple T2/FLAIR hyperintense foci in the hemispheres, brainstem and cerebellum.  2 foci in the pons and medulla enhance after contrast.  There are also several foci in the hemispheres that enhanced.  There are several foci within the spinal cord.  These are located at C2, more to the right, C3, towards the right and centrally and C4-C5 centrally.  There is subtle enhancement of the C4C5 focus.    There is no family history of MS or other autoimmune diseases.   REVIEW OF SYSTEMS: Constitutional: No fevers, chills, sweats, or change in appetite. Eyes: No visual changes, double vision, eye pain Ear, nose and throat: No hearing loss, ear pain, nasal congestion, sore throat Cardiovascular: No chest pain, palpitations Respiratory: No shortness of breath at rest or with  exertion.   No wheezes GastrointestinaI: No nausea, vomiting, diarrhea, abdominal pain, fecal incontinence Genitourinary: No dysuria, urinary retention or frequency.  No incontinence or nocturia.   Musculoskeletal: No neck pain, back pain Integumentary: No rash, pruritus, skin lesions Neurological: as above Psychiatric: As above Endocrine: No palpitations, diaphoresis, change in appetite, change in weigh or increased thirst Hematologic/Lymphatic: No anemia, purpura, petechiae. Allergic/Immunologic: No itchy/runny eyes, nasal congestion, recent allergic reactions, rashes  ALLERGIES: Allergies  Allergen Reactions  . Amoxicillin Other (See Comments)    CAUSED BAD NIGHTMARES  . Codeine Nausea Only    SEVERE NAUSEA  . Vyvanse [Lisdexamfetamine] Other (See Comments)    "Caused depression to the point of self-harm"    HOME MEDICATIONS:  Current Outpatient Medications:  .  acetaminophen (TYLENOL) 325 MG tablet, Take 2 tablets (650 mg total) by mouth every 6 (six) hours as needed for mild pain (or Fever >/= 101)., Disp:  , Rfl:  .  bisacodyl (DULCOLAX) 5 MG EC tablet, Take 2 tablets (10 mg total) by mouth daily after supper., Disp: 30 tablet, Rfl: 0 .  FLUoxetine (PROZAC) 40 MG capsule, Take 1 capsule (40 mg total) by mouth in the morning., Disp: 30 capsule, Rfl: 3 .  methylphenidate 36 MG PO CR tablet, Take 1 tablet (36 mg total) by mouth daily., Disp: 30 tablet, Rfl: 0  PAST MEDICAL HISTORY: Past Medical History:  Diagnosis Date  . ADHD     PAST SURGICAL HISTORY: Past Surgical History:  Procedure Laterality Date  . WISDOM TOOTH EXTRACTION     x4    FAMILY HISTORY: Family History  Problem Relation Age of Onset  . Hyperlipidemia Maternal Grandmother   . Hypertension Maternal Grandmother   . Diabetes Paternal Grandfather   . Hypertension Paternal Grandfather   . Heart failure Paternal Grandfather     SOCIAL HISTORY:  Social History   Socioeconomic History  . Marital  status: Single    Spouse name: Not on file  . Number of children: Not on file  . Years of education: Not on file  . Highest education level: Not on file  Occupational History  . Not on file  Tobacco Use  . Smoking status: Current Every Day Smoker  . Smokeless tobacco: Never Used  Substance and Sexual Activity  . Alcohol use: Not Currently  . Drug use: Never  . Sexual activity: Not on file  Other Topics Concern  . Not on file  Social History Narrative   Right handed   Caffeine use: tea and soda daily   Social Determinants of Health   Financial Resource Strain:   . Difficulty of Paying Living Expenses:   Food Insecurity:   . Worried About Radiation protection practitioner  of Food in the Last Year:   . Ran Out of Food in the Last Year:   Transportation Needs:   . Lack of Transportation (Medical):   [name] Lack of Transportation (Non-Medical):   Physical Activity:   . Days of Exercise per Week:   . Minutes of Exercise per Session:   Stress:   . Feeling of Stress :   Social Connections:   . Frequency of Communication with Friends and Family:   . Frequency of Social Gatherings with Friends and Family:   . Attends Religious Services:   . Active Member of Clubs or Organizations:   . Attends Banker Meetings:   [name] Marital Status:   Intimate Partner Violence:   . Fear of Current or Ex-Partner:   . Emotionally Abused:   [name] Physically Abused:   . Sexually Abused:      PHYSICAL EXAM  Vitals:   05/03/19 0854  BP: 119/73  Pulse: 85  Temp: (!) 97.4 F (36.3 C)  Weight: 186 lb (84.4 kg)  Height: 5\' 4"  (1.626 m)    Body mass index is 31.93 kg/m.   Hearing Screening   125Hz  250Hz  500Hz  1000Hz  2000Hz  3000Hz  4000Hz  6000Hz  8000Hz   Right ear:           Left ear:             Visual Acuity Screening   Right eye Left eye Both eyes  Without correction:     With correction: 20/30 20/30 20/20     General: The patient is well-developed and well-nourished and in no acute  distress  HEENT:  Head is Coleman/AT.  Sclera are anicteric.  Funduscopic exam shows normal optic discs and retinal vessels.  Neck: No carotid bruits are noted.  The neck is nontender.  Cardiovascular: The heart has a regular rate and rhythm with a normal S1 and S2. There were no murmurs, gallops or rubs.    Skin: Extremities are without rash or  edema.  Musculoskeletal:  Back is nontender  Neurologic Exam  Mental status: The patient is alert and oriented x 3 at the time of the examination. The patient has apparent normal recent and remote memory, with an apparently normal attention span and concentration ability.   Speech is normal.  Cranial nerves: Extraocular movements are full. Pupils are equal, round, and reactive to light and accomodation.  Visual fields are full.  Facial symmetry is present. There is good facial sensation to soft touch bilaterally.Facial strength is normal.  Trapezius and sternocleidomastoid strength is normal. No dysarthria is noted.  The tongue is midline, and the patient has symmetric elevation of the soft palate. No obvious hearing deficits are noted.  Motor:  Muscle bulk is normal.   Tone is normal. Strength is  5 / 5 in all 4 extremities.   Sensory: Sensory testing is intact to soft touch and vibration sensation in arms and mild reduced vibration in left leg.  Coordination: Cerebellar testing reveals good finger-nose-finger and heel-to-shin bilaterally.  Gait and station: Station is normal.   Gait has a slightly reduced stride.  Tandem gait is wide.. Romberg is negative.   Reflexes: Deep tendon reflexes are symmetric but increase at the knees with spread and at the ankles with nonsustained clonus.   Plantar responses are flexor.    DIAGNOSTIC DATA (LABS, IMAGING, TESTING) - I reviewed patient records, labs, notes, testing and imaging myself where available.  Lab Results  Component Value Date   WBC 6.2 04/24/2019  HGB 14.2 04/24/2019   HCT 40.4 04/24/2019    MCV 82.4 04/24/2019   PLT 226 04/24/2019      Component Value Date/Time   NA 139 04/24/2019 0607   K 3.8 04/24/2019 0607   CL 104 04/24/2019 0607   CO2 24 04/24/2019 0607   GLUCOSE 81 04/24/2019 0607   BUN 7 04/24/2019 0607   CREATININE 0.74 04/24/2019 0607   CALCIUM 9.0 04/24/2019 0607   PROT 6.0 (L) 04/18/2019 0511   ALBUMIN 3.5 04/18/2019 0511   AST 30 04/18/2019 0511   ALT 43 04/18/2019 0511   ALKPHOS 51 04/18/2019 0511   BILITOT 1.1 04/18/2019 0511   GFRNONAA >60 04/24/2019 0607   GFRAA >60 04/24/2019 0607    Lab Results  Component Value Date   TSH 2.107 04/09/2019       ASSESSMENT AND PLAN  Multiple sclerosis (HCC) - Plan: Other/Misc lab test, HIV Antibody (routine testing w rflx), Hepatitis B surface antigen, Hepatitis B core antibody, total, QuantiFERON-TB Gold Plus, Varicella zoster antibody, IgG, Hepatitis B surface antibody,qualitative, Hepatitis C antibody, Stratify JCV Antibody Test ([facility]), VITAMIN D 25 Hydroxy (Vit-D Deficiency, Fractures)  High risk medication use - Plan: Other/Misc lab test, HIV Antibody (routine testing w rflx), Hepatitis B surface antigen, Hepatitis B core antibody, total, QuantiFERON-TB Gold Plus, Varicella zoster antibody, IgG, Hepatitis B surface antibody,qualitative, Hepatitis C antibody, Stratify JCV Antibody Test ([facility])  Vitamin D deficiency - Plan: VITAMIN D 25 Hydroxy (Vit-D Deficiency, Fractures)  Neurogenic bladder  Attention deficit hyperactivity disorder (ADHD), unspecified ADHD type  Depression with anxiety   In summary, Ms. Macadams is a 19 year old woman with with recent neurologic symptoms who was found to have an MRI consistent with multiple sclerosis.  Combination of her symptom presentation over the last few months and the MRI findings meet the McDonald criteria for relapsing remitting MS.  Since the 5 days of IV Solu-Medrol, symptoms have improved and she is currently close to her baseline.  She is presenting  with a higher than average level of aggressiveness due to multiple spinal cord and brainstem lesions.   Therefore, we need to initiate therapy with a highly effective disease modifying drug.  We went over options including Tysabri, ocrelizumab, Zeposia and Mayzent.  I recommended that if she is JCV antibody negative that she initiate Tysabri and if she is JCV antibody positive that she should initiate Ocrevus.  We will need to rule out chronic infection such as hepatitis B and HIV and TB.  We will also check vitamin D and supplement with prescription strength if significantly low.  Otherwise she will just take OTC supplements.  We will let her know the results when they return, probably towards the end of next week.  In the interim I gave her information that she could read up on about the different therapies.  When we get the final results of the tests we will go over the options to decide the DMT.  She also has had a fair amount of anxiety and depression, worsened by the recent diagnosis.  If symptoms do not improve, consider referral to behavioral health.  She will return to see [place] in 3 months or sooner for new or worsening neurologic symptoms.  Thank you for asking me to see Ms. Finlay.  Please let me know if I can be of further assistance with her or other patients in the future.   Bentlie Withem A. Epimenio Foot, MD, [facility] 05/03/2019, 12:51 PM Certified in Neurology, Clinical Neurophysiology, Sleep Medicine  and Neuroimaging  [facility] [address] [place], [place] [address] [phone]

## 2019-05-03 NOTE — Telephone Encounter (Signed)
Placed JCV lab in [facility] lock box for routine lab pick up. Results pending. 

## 2019-05-03 NOTE — Addendum Note (Signed)
Addended by: Keene Breath B on: 05/03/2019 01:44 PM   Modules accepted: Orders

## 2019-05-03 NOTE — Therapy (Signed)
[facility] [address] [place], [place], [address] Phone: [phone]   Fax:  [phone]  Physical Therapy Treatment  Patient Details  Name: Jean Carlson MRN: [id number] Date of Birth: 2000-09-02 Referring Provider (PT): Charlton Amor, PA-C   Encounter Date: 05/02/2019  PT End of Session - 05/02/19 1019    Visit Number  2    Number of Visits  17    Date for PT Re-Evaluation  06/26/19    Authorization Type  BCBS    PT Start Time  0932    PT Stop Time  1018    PT Time Calculation (min)  46 min    Activity Tolerance  Patient tolerated treatment well    Behavior During Therapy  [facility] for tasks assessed/performed       Past Medical History:  Diagnosis Date  . ADHD     Past Surgical History:  Procedure Laterality Date  . WISDOM TOOTH EXTRACTION     x4    There were no vitals filed for this visit.  Subjective Assessment - 05/02/19 0932    Subjective  Denies any falls or changes since last visit.    Patient is accompained by:  Family member    Pertinent History  ADHD    Patient Stated Goals  Play softball, play with the dog    Currently in Pain?  No/denies                       [facility] Adult PT Treatment/Exercise - 05/03/19 0001      High Level Balance   High Level Balance Activities  Braiding;Backward walking;Tandem walking    High Level Balance Comments  in // bars for SLS x 10 sec x 3 reps each side, tandem stance x 10 sec x 3 reps each direction, braiding  8' x 8 reps, tandem walking 8' x 6 reps and backward tandem 8' x 6 reps.  Provided as HEP.      Self-Care   Self-Care  Other Self-Care Comments    Other Self-Care Comments   Began discussion of importance of energy conservation with MS      Exercises   Exercises  Ankle      Ankle Exercises: Seated   Other Seated Ankle Exercises  ankle eversion x 15 reps bil sides with red theraband and ankle dorsiflexion bil x 15 reps with  red theraband  Provided as part of HEP             PT Education - 05/02/19 1018    Education Details  HEP, Information sheet to prepare for aquatic sessions    Person(s) Educated  Patient;Parent(s)    Methods  Explanation;Demonstration;Verbal cues;Handout    Comprehension  Verbalized understanding;Returned demonstration       PT Short Term Goals - 04/27/19 2140      PT SHORT TERM GOAL #1   Title  Pt will initiate and demonstrate compliance with land based HEP.    Time  4    Period  Weeks    Status  New    Target Date  05/27/19      PT SHORT TERM GOAL #2   Title  Pt will improve BERG balance to 56/56    Baseline  53/56    Time  4    Period  Weeks    Status  New    Target Date  05/27/19      PT SHORT TERM  GOAL #3   Title  Pt will improve FGA to 28/30    Baseline  24/30    Time  4    Period  Weeks    Status  New    Target Date  05/27/19        PT Long Term Goals - 04/27/19 2143      PT LONG TERM GOAL #1   Title  Pt will demonstrate compliance with land and aquatic HEP    Time  8    Period  Weeks    Status  New    Target Date  06/26/19      PT LONG TERM GOAL #2   Title  Pt will improve FGA to 30/30 to indicate low falls risk    Time  8    Period  Weeks    Status  New    Target Date  06/26/19      PT LONG TERM GOAL #3   Title  Pt will demonstrate ability to transfer safely floor <> stand MOD I multiple times    Time  8    Period  Weeks    Status  New    Target Date  06/26/19      PT LONG TERM GOAL #4   Title  Pt will demonstrate ability to catch/throw softball and jog in grass x 200' without LOB    Time  8    Period  Weeks    Status  New    Target Date  06/26/19            Plan - 05/03/19 1034    Clinical Impression Statement  Skilled session focused on balance and strengthening activities and preparing HEP.  Educated on aquatic program and has first aquatic session 05/03/19.  Cont PT per POC.    Personal Factors and Comorbidities   Comorbidity 1    Comorbidities  ADHD    Examination-Activity Limitations  Locomotion Level;Stairs    Examination-Participation Restrictions  Community Activity;Driving    Stability/Clinical Decision Making  Stable/Uncomplicated    Rehab Potential  Excellent    PT Frequency  2x / week    PT Duration  8 weeks    PT Treatment/Interventions  ADLs/Self Care Home Management;Aquatic Therapy;Electrical Stimulation;Gait training;Stair training;Functional mobility training;Therapeutic activities;Therapeutic exercise;Balance training;Neuromuscular re-education;Patient/family education;Orthotic Fit/Training    PT Next Visit Plan  Review HEP and progress as needed.  Softball outside on grass.  Trial Bioness for strengthening.    Consulted and Agree with Plan of Care  Patient;Family member/caregiver    Family Member Consulted  Mother       Patient will benefit from skilled therapeutic intervention in order to improve the following deficits and impairments:  Abnormal gait, Decreased balance, Decreased coordination, Decreased strength, Difficulty walking, Impaired tone  Visit Diagnosis: Muscle weakness (generalized)  Repeated falls  Other symptoms and signs involving the nervous system  Other abnormalities of gait and mobility  Other lack of coordination     Problem List Patient Active Problem List   Diagnosis Date Noted  . Class 1 obesity due to excess calories without serious comorbidity with body mass index (BMI) of 31.0 to 31.9 in adult   . Neurogenic bowel   . Hypokalemia   . Neurogenic bladder 04/18/2019  . Demyelinating disorder ([place]) 04/12/2019  . ADHD 04/12/2019  . MS (multiple sclerosis) ([place]) 04/10/2019  . Tobacco abuse 04/10/2019    Narda Bonds, PTA [place] 05/03/19 10:36 AM Phone: [phone] Fax:  [phone]   [facility] [address] [place], [place],  [address] Phone: [phone]   Fax:  [phone]  Name: Jean Carlson MRN: [id number] Date of Birth: [birth year]

## 2019-05-04 ENCOUNTER — Encounter: Payer: Self-pay | Admitting: Physical Therapy

## 2019-05-04 NOTE — Therapy (Signed)
[facility] [address] [place], [place], [address] Phone: [phone]   Fax:  [phone]  Physical Therapy Treatment  Patient Details  Name: Jean Carlson MRN: [id number] Date of Birth: 03-04-2000 Referring Provider (PT): Cathlyn Parsons, PA-C   Encounter Date: 05/03/2019  PT End of Session - 05/04/19 1142    Visit Number  3    Number of Visits  17    Date for PT Re-Evaluation  06/26/19    Authorization Type  BCBS    PT Start Time  1415    PT Stop Time  1500    PT Time Calculation (min)  45 min    Equipment Utilized During Treatment  Other (comment)   ankle buoyancy cuffs, neck noodle, pool noodle   Activity Tolerance  Patient tolerated treatment well    Behavior During Therapy  [facility] for tasks assessed/performed       Past Medical History:  Diagnosis Date  . ADHD     Past Surgical History:  Procedure Laterality Date  . WISDOM TOOTH EXTRACTION     x4    There were no vitals filed for this visit.  Subjective Assessment - 05/04/19 1141    Subjective  Denies any falls or changes.  Had visit with Dr Felecia Shelling this morning.  He wants pt to have IV infusions.    Patient is accompained by:  Family member    Pertinent History  ADHD    Patient Stated Goals  Play softball, play with the dog    Currently in Pain?  No/denies       Aquatic therapy at [facility] - pool temp 86.7degrees  Patient seen for aquatic therapy today. Treatment took place in water 3.5-4 feet deep depending upon activity. Pt entered and exited the pool via step negotiation step over step sequence with use of bil. hand rails with supervision:   Runners stretch bil LE's and toes/feet up edge of pool x 30 seconds each bil LE's.  Pt performed gait training in pool forwards 53m x 2 repswithout UE support, 87m x 2 marchingthen 43m x 2 repsbackwards. Performed side stepping 58m x 2 then side stepping squatsx 50m x 2.Cues for  technique/sequence.  Standing with 1-0 UE support of pool edge for bil LE marching, hip extension, hip abd, hamstring curl with buoyancy cuffs. Pt able to perform 15 reps each side.   Squats while standing at pool edge for UE support x 15 repsthen 15 reps without UE assist. Performed single leg squats x 15 reps each sidewith UE support.  Supine with neck noodle and pool noodle under arms for bicyling x 21m x 2.  PTA providing min support to stay in this position.  In same position with PTA assisting pt to place bil feet on pool edge and moving into bil knee flexion to push off against wall.  Performed 10 reps.   Forward<>backward step weight shifting without UE support.   Pt requires buoyancy for support with balance and viscosity for resistance for strengthening exercises; buoyancy is also needed for off loading body to assist with exercises.   PT Education - 05/04/19 1147    Education Details  continue to stress energy conservation    Person(s) Educated  Patient;Parent(s)    Methods  Explanation    Comprehension  Verbalized understanding;Need further instruction       PT Short Term Goals - 04/27/19 2140      PT SHORT TERM GOAL #1   Title  Pt will initiate and demonstrate compliance with land based HEP.    Time  4    Period  Weeks    Status  New    Target Date  05/27/19      PT SHORT TERM GOAL #2   Title  Pt will improve BERG balance to 56/56    Baseline  53/56    Time  4    Period  Weeks    Status  New    Target Date  05/27/19      PT SHORT TERM GOAL #3   Title  Pt will improve FGA to 28/30    Baseline  24/30    Time  4    Period  Weeks    Status  New    Target Date  05/27/19        PT Long Term Goals - 04/27/19 2143      PT LONG TERM GOAL #1   Title  Pt will demonstrate compliance with land and aquatic HEP    Time  8    Period  Weeks    Status  New    Target Date  06/26/19      PT LONG TERM GOAL #2   Title  Pt will improve FGA to 30/30 to  indicate low falls risk    Time  8    Period  Weeks    Status  New    Target Date  06/26/19      PT LONG TERM GOAL #3   Title  Pt will demonstrate ability to transfer safely floor <> stand MOD I multiple times    Time  8    Period  Weeks    Status  New    Target Date  06/26/19      PT LONG TERM GOAL #4   Title  Pt will demonstrate ability to catch/throw softball and jog in grass x 200' without LOB    Time  8    Period  Weeks    Status  New    Target Date  06/26/19            Plan - 05/04/19 1144    Clinical Impression Statement  Pt tolerated first aquatic session well today.  Needs cues to slow down and focus on movements.  Continue PT per POC.    Personal Factors and Comorbidities  Comorbidity 1    Comorbidities  ADHD    Examination-Activity Limitations  Locomotion Level;Stairs    Examination-Participation Restrictions  Community Activity;Driving    Stability/Clinical Decision Making  Stable/Uncomplicated    Rehab Potential  Excellent    PT Frequency  2x / week    PT Duration  8 weeks    PT Treatment/Interventions  ADLs/Self Care Home Management;Aquatic Therapy;Electrical Stimulation;Gait training;Stair training;Functional mobility training;Therapeutic activities;Therapeutic exercise;Balance training;Neuromuscular re-education;Patient/family education;Orthotic Fit/Training    PT Next Visit Plan  Review HEP and progress as needed.  Softball outside on grass.  Trial Bioness for strengthening.    Consulted and Agree with Plan of Care  Patient;Family member/caregiver    Family Member Consulted  Mother       Patient will benefit from skilled therapeutic intervention in order to improve the following deficits and impairments:  Abnormal gait, Decreased balance, Decreased coordination, Decreased strength, Difficulty walking, Impaired tone  Visit Diagnosis: Repeated falls  Other symptoms and signs involving the nervous system  Other abnormalities of gait and  mobility  Muscle weakness (generalized)     Problem List  Patient Active Problem List   Diagnosis Date Noted  . High risk medication use 05/03/2019  . Vitamin D deficiency 05/03/2019  . Depression with anxiety 05/03/2019  . Class 1 obesity due to excess calories without serious comorbidity with body mass index (BMI) of 31.0 to 31.9 in adult   . Neurogenic bowel   . Hypokalemia   . Neurogenic bladder 04/18/2019  . Demyelinating disorder (HCC) 04/12/2019  . ADHD 04/12/2019  . Multiple sclerosis (HCC) 04/10/2019  . Tobacco abuse 04/10/2019    Newell Coral, PTA [facility] 05/04/19 11:54 AM Phone: [phone] Fax: [phone]   [facility] [address] [place], [place], [address] Phone: [phone]   Fax:  [phone]  Name: Jean Carlson MRN: [id number] Date of Birth: 10-08-2000

## 2019-05-05 ENCOUNTER — Other Ambulatory Visit: Payer: Self-pay

## 2019-05-05 ENCOUNTER — Encounter
Payer: BC Managed Care – PPO | Attending: Physical Medicine and Rehabilitation | Admitting: Physical Medicine and Rehabilitation

## 2019-05-05 ENCOUNTER — Encounter: Payer: Self-pay | Admitting: Physical Medicine and Rehabilitation

## 2019-05-05 VITALS — BP 119/77 | HR 81 | Temp 97.7°F | Ht 64.0 in | Wt 184.0 lb

## 2019-05-05 DIAGNOSIS — N319 Neuromuscular dysfunction of bladder, unspecified: Secondary | ICD-10-CM | POA: Insufficient documentation

## 2019-05-05 DIAGNOSIS — G379 Demyelinating disease of central nervous system, unspecified: Secondary | ICD-10-CM | POA: Insufficient documentation

## 2019-05-05 DIAGNOSIS — G35 Multiple sclerosis: Secondary | ICD-10-CM | POA: Insufficient documentation

## 2019-05-05 NOTE — Progress Notes (Addendum)
Subjective:    Patient ID: Jean Carlson, female    DOB: [birth year], 19 y.o.   MRN: [id number]  HPI   Patient is a 19 yr old female with initial MS- s/p initial MS exacerbation-    Has seen Neuro- discussed MS meds, didn't make decision. Getting labs to make sure no TB, JCV, etc.  Thinking about Tysabri - 2 pills and and another one that every 6 months IV infusion- that starts with "O".  Has seen therapy- doing Outpatient PT, OT and SLP.  Because of slurs words when tired, mainly.   Walking without assistance; doing laundry, doing dishes, making bed; putting up clothes, taking out trash, doing light housework.   Fatigue is doing well, until does 1 too many things-  Takes a few hours to recover- 30-120 minutes- depends on how hard pushing.   Working on balance and strengthening up ankles and leg- everything doing well- no pain.   Sleeps 8-10 hours/night- normal for her.  Feels well rested when wakes up.    Quit smoking!   Playing in backyard [place]ella.   Having urinary retention   Pain Inventory Average Pain 0 Pain Right Now 0 My pain is no pain  In the last 24 hours, has pain interfered with the following? General activity 0 Relation with others 0 Enjoyment of life 0 What TIME of day is your pain at its worst? no pain Sleep (in general) Good  Pain is worse with: no pain Pain improves with: no pain Relief from Meds: no pain  Mobility walk without assistance ability to climb steps?  yes do you drive?  yes  Function not employed: date last employed .  Neuro/Psych tremor  Prior Studies transitional  Physicians involved in your care transitional   Family History  Problem Relation Age of Onset  . Hyperlipidemia Maternal Grandmother   . Hypertension Maternal Grandmother   . Diabetes Paternal Grandfather   . Hypertension Paternal Grandfather   . Heart failure Paternal Grandfather    Social History   Socioeconomic History  . Marital status:  Single    Spouse name: Not on file  . Number of children: Not on file  . Years of education: Not on file  . Highest education level: Not on file  Occupational History  . Not on file  Tobacco Use  . Smoking status: Current Every Day Smoker  . Smokeless tobacco: Never Used  Substance and Sexual Activity  . Alcohol use: Not Currently  . Drug use: Never  . Sexual activity: Not on file  Other Topics Concern  . Not on file  Social History Narrative   Right handed   Caffeine use: tea and soda daily   Social Determinants of Health   Financial Resource Strain:   . Difficulty of Paying Living Expenses:   Food Insecurity:   . Worried About Charity fundraiser in the Last Year:   . Arboriculturist in the Last Year:   Transportation Needs:   . Film/video editor (Medical):   [name] Lack of Transportation (Non-Medical):   Physical Activity:   . Days of Exercise per Week:   . Minutes of Exercise per Session:   Stress:   . Feeling of Stress :   Social Connections:   . Frequency of Communication with Friends and Family:   . Frequency of Social Gatherings with Friends and Family:   . Attends Religious Services:   . Active Member of Clubs or Organizations:   .  Attends Banker Meetings:   [name] Marital Status:    Past Surgical History:  Procedure Laterality Date  . WISDOM TOOTH EXTRACTION     x4   Past Medical History:  Diagnosis Date  . ADHD    Temp 97.7 F (36.5 C)   Ht 5\' 4"  (1.626 m)   Wt 184 lb (83.5 kg)   BMI 31.58 kg/m   Opioid Risk Score:   Fall Risk Score:  `1  Depression screen PHQ 2/9  No flowsheet data found.  Review of Systems  Constitutional: Negative.   HENT: Negative.   Eyes: Negative.   Respiratory: Negative.   Cardiovascular: Negative.   Gastrointestinal: Positive for constipation.  Endocrine: Negative.   Genitourinary: Negative.   Musculoskeletal: Negative.   Skin: Negative.   Allergic/Immunologic: Negative.   Neurological: Positive  for tremors.  Hematological: Negative.   Psychiatric/Behavioral: Negative.   All other systems reviewed and are negative.      Objective:   Physical Exam Awake, alert, appropriate, accompanied by BF, NAD Mother on cell phone EOMI B/L no nystagmus No facial droop No facial sensation differences Coordination decreased Finger to nose and rapid alternating movement L>R       Assessment & Plan:  Patient is a 19 yr old female with MS- here for f/u.   1. Discussed diet- and low sugar/Miditeranean diet- as a great idea.   2. Discussed 20 spoons theory- and explained what it means. Chronic fatigue with MS is expected.    3. Went over my role- bowel, bladder, spasticity, gait, walking, etc, assistive devices,   4. If feeling "funny"- Call Neuro- if gets worse before Neuro appointment, ER visit.   5. Discussed intimacy-for 20 minutes. In detail.   6. Discussed THC, CBD oil- not appropriate at this time.   7.  Discussed no hot water! Stay away from hot tubs.   8. F/u in 6 months  9. Sent a referral to Urology- pt has MS, new onset and used to have urinary urgency and incontinence due to "gotta go", but now is having urinary retention- difficulty voiding and emptying- which is since she's been home- will place Neuro-Urology consult for Dr 26 due to underlying MS dx.    I spent a total of 40 minutes on patient - more than 20 minutes discussing diet/exercise and low inflammation levels and 20 minutes on intimacy.

## 2019-05-05 NOTE — Patient Instructions (Addendum)
Patient is a 19 yr old female with MS- here for f/u.   1. Discussed diet- and low sugar/Miditeranean diet- as a great idea.   2. Discussed 20 spoons theory- and explained what it means. Chronic fatigue with MS is expected.    3. Went over my role- bowel, bladder, spasticity, gait, walking, etc, assistive devices,   4. If feeling "funny"- Call Neuro- if gets worse before Neuro appointment, ER visit.   5. Discussed intimacy-for 20 minutes. In detail.   6. Discussed THC, CBD oil- not appropriate at this time.   7.  Discussed no hot water! Stay away from hot tubs.   8. F/u in 6 months  9. Pelvic floor specialist can be done after therapy  10. Needs Urology consult- Dr Sherron Monday- for Bladder retention- Turn water on- bladder tapping-

## 2019-05-08 ENCOUNTER — Ambulatory Visit: Payer: BC Managed Care – PPO | Admitting: Physical Therapy

## 2019-05-08 ENCOUNTER — Other Ambulatory Visit: Payer: Self-pay

## 2019-05-08 ENCOUNTER — Encounter: Payer: Self-pay | Admitting: Physical Therapy

## 2019-05-08 DIAGNOSIS — R2681 Unsteadiness on feet: Secondary | ICD-10-CM | POA: Diagnosis not present

## 2019-05-08 DIAGNOSIS — R471 Dysarthria and anarthria: Secondary | ICD-10-CM | POA: Diagnosis not present

## 2019-05-08 DIAGNOSIS — M6281 Muscle weakness (generalized): Secondary | ICD-10-CM | POA: Diagnosis not present

## 2019-05-08 DIAGNOSIS — R278 Other lack of coordination: Secondary | ICD-10-CM | POA: Diagnosis not present

## 2019-05-08 DIAGNOSIS — R296 Repeated falls: Secondary | ICD-10-CM | POA: Diagnosis not present

## 2019-05-08 DIAGNOSIS — R2689 Other abnormalities of gait and mobility: Secondary | ICD-10-CM | POA: Diagnosis not present

## 2019-05-08 DIAGNOSIS — R29818 Other symptoms and signs involving the nervous system: Secondary | ICD-10-CM

## 2019-05-08 DIAGNOSIS — R41844 Frontal lobe and executive function deficit: Secondary | ICD-10-CM | POA: Diagnosis not present

## 2019-05-08 DIAGNOSIS — R41840 Attention and concentration deficit: Secondary | ICD-10-CM | POA: Diagnosis not present

## 2019-05-08 NOTE — Therapy (Signed)
[facility] [address] [place], [place], [address] Phone: [phone]   Fax:  [phone]  Physical Therapy Treatment  Patient Details  Name: Jean Carlson MRN: [id number] Date of Birth: 08-26-2000 Referring Provider (PT): Charlton Amor, PA-C   Encounter Date: 05/08/2019  PT End of Session - 05/08/19 1945    Visit Number  4    Number of Visits  17    Date for PT Re-Evaluation  06/26/19    Authorization Type  BCBS    PT Start Time  1500    PT Stop Time  1545    PT Time Calculation (min)  45 min    Equipment Utilized During Treatment  Other (comment)   ankle buoyancy cuffs, pool noodle   Activity Tolerance  Patient tolerated treatment well    Behavior During Therapy  [facility] for tasks assessed/performed       Past Medical History:  Diagnosis Date  . ADHD     Past Surgical History:  Procedure Laterality Date  . WISDOM TOOTH EXTRACTION     x4    There were no vitals filed for this visit.  Subjective Assessment - 05/08/19 1943    Subjective  Denies any falls or changes.  Threw softball yesterday.  Was a little tired afterwards.    Patient is accompained by:  Family member    Pertinent History  ADHD    Patient Stated Goals  Play softball, play with the dog    Currently in Pain?  No/denies       Aquatic therapy at [facility] - pool temp 86.7degrees  Patient seen for aquatic therapy today. Treatment took place in water 3.5-4 feet deep depending upon activity. Pt entered and exited the pool via step negotiation step over step sequence with use of bil. hand rails with supervision:   Runners stretch bil LE's and toes/feet up edge of pool x 30 seconds each bil LE's both at beginning and end of session.  Pt performed gait training in pool forwards 54m x 2 repswithout UE support, 7m x 2 working on increased armswing, 73m x 2 marchingthen 62m x 2 repsbackwards. Performed side stepping 58m x 2 thenside  stepping squatsx 60m x 2.Cues for technique/sequence.  Jogging 44m x 2 with rest break in between.  Standing with1-0UE support of pool edge for bil LE marching, hip extension, hip abd, hamstring curl and straight leg hip flexion with buoyancy cuffs. Pt able to perform 15 reps each side.   Squats while standing at pool edge for UE support x 15 repswithout UE assist. Performed single leg squats x 15 reps each sidewith UE support. Performed 1/2 jumping jacks x 15 reps x 2 sets.  Supine with pool noodle under arms for bicyling x 79m x 2.  PTA providing closed supervision while in this position.  In same position with PTA assisting pt to place bil feet on pool edge and moving into bil knee flexion to push off against wall without pool noodle.  Performed 10 reps.   Forward<>backward step weight shifting without UE support.  Pt requires buoyancy for support with balance and viscosity for resistance for strengthening exercises; buoyancy is also needed for off loading body to assist with exercises.   PT Short Term Goals - 04/27/19 2140      PT SHORT TERM GOAL #1   Title  Pt will initiate and demonstrate compliance with land based HEP.    Time  4    Period  Weeks    Status  New    Target Date  05/27/19      PT SHORT TERM GOAL #2   Title  Pt will improve BERG balance to 56/56    Baseline  53/56    Time  4    Period  Weeks    Status  New    Target Date  05/27/19      PT SHORT TERM GOAL #3   Title  Pt will improve FGA to 28/30    Baseline  24/30    Time  4    Period  Weeks    Status  New    Target Date  05/27/19        PT Long Term Goals - 04/27/19 2143      PT LONG TERM GOAL #1   Title  Pt will demonstrate compliance with land and aquatic HEP    Time  8    Period  Weeks    Status  New    Target Date  06/26/19      PT LONG TERM GOAL #2   Title  Pt will improve FGA to 30/30 to indicate low falls risk    Time  8    Period  Weeks    Status  New    Target  Date  06/26/19      PT LONG TERM GOAL #3   Title  Pt will demonstrate ability to transfer safely floor <> stand MOD I multiple times    Time  8    Period  Weeks    Status  New    Target Date  06/26/19      PT LONG TERM GOAL #4   Title  Pt will demonstrate ability to catch/throw softball and jog in grass x 200' without LOB    Time  8    Period  Weeks    Status  New    Target Date  06/26/19            Plan - 05/08/19 1946    Clinical Impression Statement  Skilled session focused on strength, balance, endurance and gait/advanced gait.  Needed several rest breaks today during session but tolerated increased skill/workload of activities.  Cont PT per POC.    Personal Factors and Comorbidities  Comorbidity 1    Comorbidities  ADHD    Examination-Activity Limitations  Locomotion Level;Stairs    Examination-Participation Restrictions  Community Activity;Driving    Stability/Clinical Decision Making  Stable/Uncomplicated    Rehab Potential  Excellent    PT Frequency  2x / week    PT Duration  8 weeks    PT Treatment/Interventions  ADLs/Self Care Home Management;Aquatic Therapy;Electrical Stimulation;Gait training;Stair training;Functional mobility training;Therapeutic activities;Therapeutic exercise;Balance training;Neuromuscular re-education;Patient/family education;Orthotic Fit/Training    PT Next Visit Plan  Review HEP and progress as needed.  Softball outside on grass.  Trial Bioness for strengthening.    Consulted and Agree with Plan of Care  Patient;Family member/caregiver    Family Member Consulted  Mother       Patient will benefit from skilled therapeutic intervention in order to improve the following deficits and impairments:  Abnormal gait, Decreased balance, Decreased coordination, Decreased strength, Difficulty walking, Impaired tone  Visit Diagnosis: Repeated falls  Other symptoms and signs involving the nervous system  Other abnormalities of gait and  mobility  Muscle weakness (generalized)     Problem List Patient Active Problem List   Diagnosis Date Noted  . High risk medication  use 05/03/2019  . Vitamin D deficiency 05/03/2019  . Depression with anxiety 05/03/2019  . Class 1 obesity due to excess calories without serious comorbidity with body mass index (BMI) of 31.0 to 31.9 in adult   . Neurogenic bowel   . Hypokalemia   . Neurogenic bladder 04/18/2019  . Demyelinating disorder (HCC) 04/12/2019  . ADHD 04/12/2019  . Multiple sclerosis (HCC) 04/10/2019  . Tobacco abuse 04/10/2019    Newell Coral, PTA [facility] 05/08/19 7:55 PM Phone: [phone] Fax: [phone]   [facility] [address] [place], [place], [address] Phone: [phone]   Fax:  [phone]  Name: Jean Carlson MRN: [id number] Date of Birth: 2000-06-11

## 2019-05-09 ENCOUNTER — Other Ambulatory Visit: Payer: Self-pay

## 2019-05-09 ENCOUNTER — Ambulatory Visit: Payer: BC Managed Care – PPO

## 2019-05-09 DIAGNOSIS — R41844 Frontal lobe and executive function deficit: Secondary | ICD-10-CM | POA: Diagnosis not present

## 2019-05-09 DIAGNOSIS — R278 Other lack of coordination: Secondary | ICD-10-CM | POA: Diagnosis not present

## 2019-05-09 DIAGNOSIS — R41840 Attention and concentration deficit: Secondary | ICD-10-CM | POA: Diagnosis not present

## 2019-05-09 DIAGNOSIS — R296 Repeated falls: Secondary | ICD-10-CM | POA: Diagnosis not present

## 2019-05-09 DIAGNOSIS — R471 Dysarthria and anarthria: Secondary | ICD-10-CM

## 2019-05-09 DIAGNOSIS — R2689 Other abnormalities of gait and mobility: Secondary | ICD-10-CM | POA: Diagnosis not present

## 2019-05-09 DIAGNOSIS — M6281 Muscle weakness (generalized): Secondary | ICD-10-CM | POA: Diagnosis not present

## 2019-05-09 DIAGNOSIS — R29818 Other symptoms and signs involving the nervous system: Secondary | ICD-10-CM | POA: Diagnosis not present

## 2019-05-09 DIAGNOSIS — R2681 Unsteadiness on feet: Secondary | ICD-10-CM | POA: Diagnosis not present

## 2019-05-09 LAB — QUANTIFERON-TB GOLD PLUS
QuantiFERON Mitogen Value: >10 IU/mL
QuantiFERON Nil Value: 0.02 IU/mL
QuantiFERON TB1 Ag Value: 0.04 IU/mL
QuantiFERON TB2 Ag Value: 0.02 IU/mL
QuantiFERON-TB Gold Plus: NEGATIVE

## 2019-05-09 LAB — HEPATITIS B SURFACE ANTIGEN: Hepatitis B Surface Ag: NEGATIVE

## 2019-05-09 LAB — CYP2C9 GENOTYPING SIPONIMOD

## 2019-05-09 LAB — VITAMIN D 25 HYDROXY (VIT D DEFICIENCY, FRACTURES): Vit D, 25-Hydroxy: 23.1 ng/mL — ABNORMAL LOW (ref 30.0–100.0)

## 2019-05-09 LAB — VARICELLA ZOSTER ANTIBODY, IGG: Varicella zoster IgG: 870 index (ref >165)

## 2019-05-09 LAB — HEPATITIS B SURFACE ANTIBODY,QUALITATIVE: Hep B Surface Ab, Qual: NONREACTIVE

## 2019-05-09 LAB — HIV ANTIBODY (ROUTINE TESTING W REFLEX): HIV Screen 4th Generation wRfx: NONREACTIVE

## 2019-05-09 LAB — HEPATITIS B CORE ANTIBODY, TOTAL: Hep B Core Total Ab: NEGATIVE

## 2019-05-09 LAB — HEPATITIS C ANTIBODY: Hep C Virus Ab: <0.1 s/co ratio (ref 0.0–0.9)

## 2019-05-09 NOTE — Patient Instructions (Addendum)
   Ways to make your speech more clear:  Slow down  Louder speech  Overarticulate (open your mouth more when you talk)  Pause between your words

## 2019-05-09 NOTE — Therapy (Signed)
[facility] [address] [place], [place], [address] Phone: [phone]   Fax:  [phone]  Speech Language Pathology Evaluation  Patient Details  Name: Jean Carlson MRN: [id number] Date of Birth: Dec 03, 2000 Referring Provider (SLP): Anguilli, D. (referral); Lucile Shutters, [place] (documentation)   Encounter Date: 05/09/2019  End of Session - 05/09/19 0847    Visit Number  1    Number of Visits  13    Date for SLP Re-Evaluation  07/07/19    SLP Start Time  0805    SLP Stop Time   0846    SLP Time Calculation (min)  41 min    Activity Tolerance  Patient tolerated treatment well       Past Medical History:  Diagnosis Date  . ADHD     Past Surgical History:  Procedure Laterality Date  . WISDOM TOOTH EXTRACTION     x4    There were no vitals filed for this visit.  Subjective Assessment - 05/09/19 0810    Subjective  "It gets worse later on in the evening depending upon how much I've - exerted myself, until I go to bed."    Currently in Pain?  No/denies         SLP Evaluation [facility] - 05/09/19 9211      SLP Visit Information   SLP Received On  05/09/19    Referring Provider (SLP)  Charisse Klinefelter, D. (referral); Lucile Shutters, [place] (documentation)    Onset Date  February 2021    Medical Diagnosis  MS      Subjective   Patient/Family Stated Goal  Improve talking when more difficult to be understood      General Information   HPI  Pt with progressive balance difficulties progressing to hands and then with vision and speech problems. Pt with premorbid dx of ADHD, which pt and mother state is at baseline. Pt using more compensations now for ADHD than previous to MS dx.      Prior Functional Status   Cognitive/Linguistic Baseline  Within functional limits    Type of Home  House     Lives With  Family    Available Support  Family    Vocation  Student      Cognition   Overall Cognitive Status  History of  cognitive impairments - at baseline   Pt with premorbid dx of ADHD   Behaviors  Other (comment)   pt now using compensations for ADHD - & was not premorbidly     Oral Motor/Sensory Function   Overall Oral Motor/Sensory Function  Appears within functional limits for tasks assessed    Labial ROM  Within Functional Limits    Labial Symmetry  Within Functional Limits    Labial Strength  Reduced Left    Labial Sensation  Within Functional Limits    Labial Coordination  WFL    Lingual ROM  Within Functional Limits    Lingual Symmetry  Within Functional Limits    Lingual Strength  Reduced Left    Lingual Sensation  Within Functional Limits    Lingual Coordination  Reduced      Motor Speech   Overall Motor Speech  Appears within functional limits for tasks assessed    Articulation  Impaired   very slight ataxic dysarthria   Level of Impairment  Phrase    Intelligibility  100% Intelligible today    Effective Techniques  Slow rate;Increased vocal intensity;Over-articulate;Pause  SLP Education - 05/09/19 0846    Education Details  Speech intelligibility strategies, abdominal breathing    Person(s) Educated  Patient;Parent(s)    Methods  Explanation    Comprehension  Verbalized understanding       SLP Short Term Goals - 05/09/19 1745      SLP SHORT TERM GOAL #1   Title  pt will demo speech intelligibilty strategies in 10 minutes simple conversation x2 sessions    Time  3    Period  Weeks    Status  New      SLP SHORT TERM GOAL #2   Title  pt will complete speech quality of life measure    Time  1    Period  --   visit      SLP Long Term Goals - 05/09/19 1747      SLP LONG TERM GOAL #1   Title  pt will use compensatory strategies for 95% speech intelligibility in 15 minutes mod complex conversation x3 sessions    Time  6    Period  Weeks   or 13 total sessions, for all LTGs     SLP LONG TERM GOAL #2   Title  pt will report a higher  QOL score than in first session    Time  6    Period  Weeks    Status  New      SLP LONG TERM GOAL #3   Title  pt will report using at least one functional strategy for stamina management re: voice/speech intelligibility between 3 sessions    Time  6    Period  Weeks    Status  New       Plan - 05/09/19 1743    Clinical Impression Statement  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 Pt with premorbid dx of ADHD;at baseline per pt and mother. Pt would like to obtain and practice some speech intelligibility strategies to use when her speech becomes more dificult for family and friends to understand.    Speech Therapy Frequency  2x / week   6 weeks   Duration  --   6 weeks, or 13 total visits   Treatment/Interventions  Functional tasks;Compensatory techniques;SLP instruction and feedback;Patient/family education;Internal/external aids;Multimodal communcation approach    Potential to Achieve Goals  Good       Patient will benefit from skilled therapeutic intervention in order to improve the following deficits and impairments:   Dysarthria and anarthria - Plan: SLP plan of care cert/re-cert    Problem List Patient Active Problem List   Diagnosis Date Noted  . High risk medication use 05/03/2019  . Vitamin D deficiency 05/03/2019  . Depression with anxiety 05/03/2019  . Class 1 obesity due to excess calories without serious comorbidity with body mass index (BMI) of 31.0 to 31.9 in adult   . Neurogenic bowel   . Hypokalemia   . Neurogenic bladder 04/18/2019  . Demyelinating disorder ([place]) 04/12/2019  . ADHD 04/12/2019  . Multiple sclerosis ([place]) 04/10/2019  . Tobacco abuse 04/10/2019    [facility] 05/09/2019, 5:50 PM  [place] [address] [place], [place], [address] Phone: [phone]   Fax:  [phone]  Name: Jean Carlson MRN:  [id number] Date of Birth: 2001/01/24

## 2019-05-10 ENCOUNTER — Ambulatory Visit: Payer: BC Managed Care – PPO | Admitting: Physical Therapy

## 2019-05-10 ENCOUNTER — Encounter: Payer: Self-pay | Admitting: Physical Therapy

## 2019-05-10 DIAGNOSIS — R296 Repeated falls: Secondary | ICD-10-CM | POA: Diagnosis not present

## 2019-05-10 DIAGNOSIS — R41840 Attention and concentration deficit: Secondary | ICD-10-CM | POA: Diagnosis not present

## 2019-05-10 DIAGNOSIS — R2689 Other abnormalities of gait and mobility: Secondary | ICD-10-CM

## 2019-05-10 DIAGNOSIS — R2681 Unsteadiness on feet: Secondary | ICD-10-CM | POA: Diagnosis not present

## 2019-05-10 DIAGNOSIS — R29818 Other symptoms and signs involving the nervous system: Secondary | ICD-10-CM | POA: Diagnosis not present

## 2019-05-10 DIAGNOSIS — R278 Other lack of coordination: Secondary | ICD-10-CM | POA: Diagnosis not present

## 2019-05-10 DIAGNOSIS — R471 Dysarthria and anarthria: Secondary | ICD-10-CM | POA: Diagnosis not present

## 2019-05-10 DIAGNOSIS — M6281 Muscle weakness (generalized): Secondary | ICD-10-CM | POA: Diagnosis not present

## 2019-05-10 DIAGNOSIS — R41844 Frontal lobe and executive function deficit: Secondary | ICD-10-CM | POA: Diagnosis not present

## 2019-05-10 NOTE — Therapy (Signed)
[facility] [address] [place], [place], [address] Phone: [phone]   Fax:  [phone]  Physical Therapy Treatment  Patient Details  Name: Jean Carlson MRN: [id number] Date of Birth: [birth year] Referring Provider (PT): Charlton Amor, PA-C   Encounter Date: 05/10/2019  PT End of Session - 05/10/19 1131    Visit Number  5    Number of Visits  17    Date for PT Re-Evaluation  06/26/19    Authorization Type  BCBS    PT Start Time  [phone]    PT Stop Time  0930    PT Time Calculation (min)  44 min    Activity Tolerance  Patient tolerated treatment well    Behavior During Therapy  [facility] for tasks assessed/performed       Past Medical History:  Diagnosis Date  . ADHD     Past Surgical History:  Procedure Laterality Date  . WISDOM TOOTH EXTRACTION     x4    There were no vitals filed for this visit.  Subjective Assessment - 05/10/19 0852    Subjective  Denies any falls. Drove to therapy today.  Is having softball practice tonight.  Has ordered a cooling vest to wear when she palys but it hasnt arrived yet.    Patient is accompained by:  Family member    Pertinent History  ADHD    Patient Stated Goals  Play softball, play with the dog    Currently in Pain?  Other (Comment)   Having tingling in her hands and "itching" feeling in her forearms         [facility] Adult PT Treatment/Exercise - 05/10/19 0001      High Level Balance   High Level Balance Activities  Side stepping;Braiding;Tandem walking;Other (comment)   in // bars;also performed backward tandem walking.       Self-Care   Self-Care  Other Self-Care Comments    Other Self-Care Comments   continue to reinforce need for energy conservation with activities.  Discussed options to reduce energy expenditure related to softball (having alternate runner after hitting, picking position with least running, not playing consecutive innings).  Also discussed  importance of supportive shoes and options of Fleet Feet vs Omega for assessment of proper supportive shoes.  See education instructions for details as well.      Exercises   Exercises  Knee/Hip      Knee/Hip Exercises: Standing   Lateral Step Up  Both;1 set;15 reps;Hand Hold: 1;Step Height: 6"    Forward Step Up  15 reps;Hand Hold: 1;Step Height: 6"    Forward Step Up Limitations  cues to control bil knee hyperextension    Step Down  Both;1 set;15 reps;Hand Hold: 1;Step Height: 6"    SLS  3 reps 10 sec with intermittent UE support    Other Standing Knee Exercises  standing hamstring curl bil with red theraband x 10-provided as HEP.             PT Education - 05/10/19 1130    Education Details  Additions to HEP, Fleet Feet vs Omega for supportive shoes, energy conservation    Person(s) Educated  Patient    Methods  Explanation;Demonstration;Verbal cues;Handout    Comprehension  Verbalized understanding;Returned demonstration       PT Short Term Goals - 04/27/19 2140      PT SHORT TERM GOAL #1   Title  Pt will initiate and demonstrate compliance with land based  HEP.    Time  4    Period  Weeks    Status  New    Target Date  05/27/19      PT SHORT TERM GOAL #2   Title  Pt will improve BERG balance to 56/56    Baseline  53/56    Time  4    Period  Weeks    Status  New    Target Date  05/27/19      PT SHORT TERM GOAL #3   Title  Pt will improve FGA to 28/30    Baseline  24/30    Time  4    Period  Weeks    Status  New    Target Date  05/27/19        PT Long Term Goals - 04/27/19 2143      PT LONG TERM GOAL #1   Title  Pt will demonstrate compliance with land and aquatic HEP    Time  8    Period  Weeks    Status  New    Target Date  06/26/19      PT LONG TERM GOAL #2   Title  Pt will improve FGA to 30/30 to indicate low falls risk    Time  8    Period  Weeks    Status  New    Target Date  06/26/19      PT LONG TERM GOAL #3   Title  Pt will  demonstrate ability to transfer safely floor <> stand MOD I multiple times    Time  8    Period  Weeks    Status  New    Target Date  06/26/19      PT LONG TERM GOAL #4   Title  Pt will demonstrate ability to catch/throw softball and jog in grass x 200' without LOB    Time  8    Period  Weeks    Status  New    Target Date  06/26/19            Plan - 05/10/19 1132    Clinical Impression Statement  Skilled session focused on strength, balance and education.  Pt reports having return of tingling in hands at times and is afraid the steroids she received in hospital are wearing off.  Still swaiting decision form MD regarding continued steriod injections/IV.  Pt continues with bil knee hyperextension.  cont PT per POC.    Personal Factors and Comorbidities  Comorbidity 1    Comorbidities  ADHD    Examination-Activity Limitations  Locomotion Level;Stairs    Examination-Participation Restrictions  Community Activity;Driving    Stability/Clinical Decision Making  Stable/Uncomplicated    Rehab Potential  Excellent    PT Frequency  2x / week    PT Duration  8 weeks    PT Treatment/Interventions  ADLs/Self Care Home Management;Aquatic Therapy;Electrical Stimulation;Gait training;Stair training;Functional mobility training;Therapeutic activities;Therapeutic exercise;Balance training;Neuromuscular re-education;Patient/family education;Orthotic Fit/Training    PT Next Visit Plan  Continue balance and strength and agility. Softball outside on grass.  Trial Bioness for strengthening.    Consulted and Agree with Plan of Care  Patient       Patient will benefit from skilled therapeutic intervention in order to improve the following deficits and impairments:  Abnormal gait, Decreased balance, Decreased coordination, Decreased strength, Difficulty walking, Impaired tone  Visit Diagnosis: Repeated falls  Other symptoms and signs involving the nervous system  Other abnormalities of gait and  mobility  Muscle weakness (generalized)     Problem List Patient Active Problem List   Diagnosis Date Noted  . High risk medication use 05/03/2019  . Vitamin D deficiency 05/03/2019  . Depression with anxiety 05/03/2019  . Class 1 obesity due to excess calories without serious comorbidity with body mass index (BMI) of 31.0 to 31.9 in adult   . Neurogenic bowel   . Hypokalemia   . Neurogenic bladder 04/18/2019  . Demyelinating disorder ([place]) 04/12/2019  . ADHD 04/12/2019  . Multiple sclerosis ([place]) 04/10/2019  . Tobacco abuse 04/10/2019    Narda Bonds, PTA [place] 05/10/19 11:35 AM Phone: [phone] Fax: [place] [address] [place], [place], [address] Phone: [phone]   Fax:  [phone]  Name: Jean Carlson MRN: [id number] Date of Birth: 05-18-2000

## 2019-05-10 NOTE — Patient Instructions (Signed)
Fleet Feet on [employer] on [address]  Have them assess your feet for the most supportive shoes to address the ankle instability  Access Code: [email or web address] URL: [email or web address] Date: 05/10/2019 Prepared by: Modena Morrow  Exercises Single Leg Stance - 1 x daily - 5 x weekly - 3 reps - 10 seconds hold Braided Sidestepping - 1 x daily - 5 x weekly - 4 sets - 10 reps Forward Backward Tandem Walking - 1 x daily - 5 x weekly - 4 sets - 10 reps Step Taps on High Step - 1 x daily - 5 x weekly - 10 reps Seated Ankle Eversion with Resistance - 1 x daily - 5 x weekly - 10 reps - 2 sets Seated Ankle Dorsiflexion with Resistance - 1 x daily - 5 x weekly - 10 reps - 2 sets Step Up - 1 x daily - 5 x weekly - 10 reps - 3-5 seconds hold Lateral Step Up - 1 x daily - 5 x weekly - 10 reps - 3-5 seconds hold Forward Step Down - 1 x daily - 5 x weekly - 10 reps Standing Hamstring Curl with Resistance - 1 x daily - 5 x weekly - 10 reps

## 2019-05-11 ENCOUNTER — Telehealth: Payer: Self-pay | Admitting: Physical Medicine and Rehabilitation

## 2019-05-11 NOTE — Telephone Encounter (Signed)
[facility] called about referral to their office.  The referral that was entered didn't mention why she was being referred there.  I looked at note and it wasn't clear as to why we were.  I looked at patients AVS and it did list a reason there.  Can you update your note to add the reason why the referral was being sent?  Thank you.

## 2019-05-11 NOTE — Telephone Encounter (Signed)
Sorry- it came up at the last minute- her Urinary retnetion issues- placed on note, why I was placing consult- if you can add that to her referral- thanks

## 2019-05-15 ENCOUNTER — Ambulatory Visit: Payer: BC Managed Care – PPO | Admitting: Physical Therapy

## 2019-05-15 ENCOUNTER — Encounter: Payer: Self-pay | Admitting: Physical Therapy

## 2019-05-15 ENCOUNTER — Other Ambulatory Visit: Payer: Self-pay

## 2019-05-15 DIAGNOSIS — R29818 Other symptoms and signs involving the nervous system: Secondary | ICD-10-CM

## 2019-05-15 DIAGNOSIS — R2689 Other abnormalities of gait and mobility: Secondary | ICD-10-CM | POA: Diagnosis not present

## 2019-05-15 DIAGNOSIS — M6281 Muscle weakness (generalized): Secondary | ICD-10-CM | POA: Diagnosis not present

## 2019-05-15 DIAGNOSIS — R471 Dysarthria and anarthria: Secondary | ICD-10-CM | POA: Diagnosis not present

## 2019-05-15 DIAGNOSIS — R41844 Frontal lobe and executive function deficit: Secondary | ICD-10-CM | POA: Diagnosis not present

## 2019-05-15 DIAGNOSIS — R41840 Attention and concentration deficit: Secondary | ICD-10-CM | POA: Diagnosis not present

## 2019-05-15 DIAGNOSIS — R296 Repeated falls: Secondary | ICD-10-CM | POA: Diagnosis not present

## 2019-05-15 DIAGNOSIS — R2681 Unsteadiness on feet: Secondary | ICD-10-CM | POA: Diagnosis not present

## 2019-05-15 DIAGNOSIS — R278 Other lack of coordination: Secondary | ICD-10-CM | POA: Diagnosis not present

## 2019-05-15 NOTE — Therapy (Signed)
[facility] [address] [place], [place], [address] Phone: [phone]   Fax:  [phone]  Physical Therapy Treatment  Patient Details  Name: Jean Carlson MRN: [id number] Date of Birth: Feb 27, 2000 Referring Provider (PT): Cathlyn Parsons, PA-C   Encounter Date: 05/15/2019  PT End of Session - 05/15/19 1835    Visit Number  6    Number of Visits  17    Date for PT Re-Evaluation  06/26/19    Authorization Type  BCBS    PT Start Time  1330    PT Stop Time  1415    PT Time Calculation (min)  45 min    Equipment Utilized During Treatment  Other (comment)   buoyancy cuffs, pool noodle, arm bar bells   Activity Tolerance  Patient tolerated treatment well    Behavior During Therapy  [facility] for tasks assessed/performed       Past Medical History:  Diagnosis Date  . ADHD     Past Surgical History:  Procedure Laterality Date  . WISDOM TOOTH EXTRACTION     x4    There were no vitals filed for this visit.  Subjective Assessment - 05/15/19 1834    Subjective  Denies any falls or changes. Played 1/2 her game of softball last week.    Patient is accompained by:  Family member    Pertinent History  ADHD    Patient Stated Goals  Play softball, play with the dog    Currently in Pain?  No/denies       Aquatic therapy at [facility] - pool temp 86.7degrees  Patient seen for aquatic therapy today. Treatment took place in water 3.5-4 feet deep depending upon activity. Pt entered and exited the pool via step negotiation stepoverstep sequence with use of bil. hand rails with supervision:   Runners stretch bil LE's and toes/feet up edge of pool x 30 seconds each bil LE's both at beginning and end of session.  Pt performed gait training in pool forwards 46m x 2 repswithout UE support, 71m x 2 working on increased armswing, 14m x 2 marchingthen 76m x 2 repsbackwards. Performed side stepping squatsx 65m x 2.Cues for  technique/sequence.  Jogging 49m x 2 with rest break in between.  Standing withoutUE support LE marching, hip extension, hip abd, hamstring curl and straight leg hip flexionwith buoyancy cuffs. Pt able to perform 15 reps each side.   Squats x 15repswithout UE assist then single leg x 15 reps each.  Supine with pool noodle under arms for bicyling x 60m x 2. PTA providing closed supervision while in this position. In same position without pool noodle pt able to place bil feet on pool edge and moving into bil knee flexion to push off against wall with only supervision of PTA. Performed 10 reps.   Ai Chi postures of enclosing, soothing and freeing x 15 reps each.  Swam under water x 15' x 4 reps with supervision.  Pt requires buoyancy for support with balance and viscosity for resistance for strengthening exercises; buoyancy is also needed for off loading body to assist with exercises.    PT Short Term Goals - 04/27/19 2140      PT SHORT TERM GOAL #1   Title  Pt will initiate and demonstrate compliance with land based HEP.    Time  4    Period  Weeks    Status  New    Target Date  05/27/19  PT SHORT TERM GOAL #2   Title  Pt will improve BERG balance to 56/56    Baseline  53/56    Time  4    Period  Weeks    Status  New    Target Date  05/27/19      PT SHORT TERM GOAL #3   Title  Pt will improve FGA to 28/30    Baseline  24/30    Time  4    Period  Weeks    Status  New    Target Date  05/27/19        PT Long Term Goals - 04/27/19 2143      PT LONG TERM GOAL #1   Title  Pt will demonstrate compliance with land and aquatic HEP    Time  8    Period  Weeks    Status  New    Target Date  06/26/19      PT LONG TERM GOAL #2   Title  Pt will improve FGA to 30/30 to indicate low falls risk    Time  8    Period  Weeks    Status  New    Target Date  06/26/19      PT LONG TERM GOAL #3   Title  Pt will demonstrate ability to transfer safely floor <>  stand MOD I multiple times    Time  8    Period  Weeks    Status  New    Target Date  06/26/19      PT LONG TERM GOAL #4   Title  Pt will demonstrate ability to catch/throw softball and jog in grass x 200' without LOB    Time  8    Period  Weeks    Status  New    Target Date  06/26/19            Plan - 05/15/19 1836    Clinical Impression Statement  Skilled session focused on strength, balance and endurance.  Pt continues to progress with aquatic sessions and is reporting return to softball.  Cont PT per POC.    Personal Factors and Comorbidities  Comorbidity 1    Comorbidities  ADHD    Examination-Activity Limitations  Locomotion Level;Stairs    Examination-Participation Restrictions  Community Activity;Driving    Stability/Clinical Decision Making  Stable/Uncomplicated    Rehab Potential  Excellent    PT Frequency  2x / week    PT Duration  8 weeks    PT Treatment/Interventions  ADLs/Self Care Home Management;Aquatic Therapy;Electrical Stimulation;Gait training;Stair training;Functional mobility training;Therapeutic activities;Therapeutic exercise;Balance training;Neuromuscular re-education;Patient/family education;Orthotic Fit/Training    PT Next Visit Plan  Continue balance and strength and agility. Softball outside on grass.  Trial Bioness for strengthening.    Consulted and Agree with Plan of Care  Patient       Patient will benefit from skilled therapeutic intervention in order to improve the following deficits and impairments:  Abnormal gait, Decreased balance, Decreased coordination, Decreased strength, Difficulty walking, Impaired tone  Visit Diagnosis: Repeated falls  Other symptoms and signs involving the nervous system  Other abnormalities of gait and mobility  Muscle weakness (generalized)     Problem List Patient Active Problem List   Diagnosis Date Noted  . High risk medication use 05/03/2019  . Vitamin D deficiency 05/03/2019  . Depression with  anxiety 05/03/2019  . Class 1 obesity due to excess calories without serious comorbidity with body mass index (BMI)  of 31.0 to 31.9 in adult   . Neurogenic bowel   . Hypokalemia   . Neurogenic bladder 04/18/2019  . Demyelinating disorder ([facility]) 04/12/2019  . ADHD 04/12/2019  . Multiple sclerosis ([facility]) 04/10/2019  . Tobacco abuse 04/10/2019    Newell Coral, PTA [facility] 05/15/19 6:45 PM Phone: [phone] Fax: [phone]   [facility] [address] [place], [place], [address] Phone: [phone]   Fax:  [phone]  Name: Jean Carlson MRN: [id number] Date of Birth: 2000/05/30

## 2019-05-15 NOTE — Telephone Encounter (Signed)
JCV ab drawn on 05/03/19 positive, index: 3.32

## 2019-05-16 ENCOUNTER — Telehealth: Payer: Self-pay | Admitting: *Deleted

## 2019-05-16 NOTE — Telephone Encounter (Signed)
-----   Message from Asa Lente, MD sent at 05/15/2019  5:39 PM EDT ----- Labs are fine for Ocrevus - - if we have the form we can send it in, otherise she can come in to sign

## 2019-05-16 NOTE — Telephone Encounter (Signed)
Called and spoke with mother, Jean Carlson (on [place]). Relayed Dr. Bonnita Hollow message. She will have her daughter stop by today to sign start form for Ocrevus. She is aware her JCV was positive, Tysabri not a option.

## 2019-05-17 ENCOUNTER — Telehealth: Payer: Self-pay | Admitting: *Deleted

## 2019-05-17 NOTE — Telephone Encounter (Signed)
Faxed complete/signed Ocrevus start form to [employer] at [phone]. Received fax confirmation. Gave completed start form to [facility] for them to process. Included office visit notes,signed order for [facility], labs, MRI. Sent copy of start form to be scanned to epic.   

## 2019-05-19 ENCOUNTER — Ambulatory Visit: Payer: BC Managed Care – PPO | Admitting: Physical Therapy

## 2019-05-19 ENCOUNTER — Ambulatory Visit: Payer: BC Managed Care – PPO

## 2019-05-19 ENCOUNTER — Encounter: Payer: Self-pay | Admitting: Physical Therapy

## 2019-05-19 ENCOUNTER — Other Ambulatory Visit: Payer: Self-pay

## 2019-05-19 ENCOUNTER — Ambulatory Visit: Payer: BC Managed Care – PPO | Admitting: Occupational Therapy

## 2019-05-19 DIAGNOSIS — R296 Repeated falls: Secondary | ICD-10-CM | POA: Diagnosis not present

## 2019-05-19 DIAGNOSIS — R471 Dysarthria and anarthria: Secondary | ICD-10-CM

## 2019-05-19 DIAGNOSIS — R278 Other lack of coordination: Secondary | ICD-10-CM

## 2019-05-19 DIAGNOSIS — R41844 Frontal lobe and executive function deficit: Secondary | ICD-10-CM

## 2019-05-19 DIAGNOSIS — R2689 Other abnormalities of gait and mobility: Secondary | ICD-10-CM | POA: Diagnosis not present

## 2019-05-19 DIAGNOSIS — R41840 Attention and concentration deficit: Secondary | ICD-10-CM | POA: Diagnosis not present

## 2019-05-19 DIAGNOSIS — R29818 Other symptoms and signs involving the nervous system: Secondary | ICD-10-CM

## 2019-05-19 DIAGNOSIS — M6281 Muscle weakness (generalized): Secondary | ICD-10-CM

## 2019-05-19 DIAGNOSIS — R2681 Unsteadiness on feet: Secondary | ICD-10-CM | POA: Diagnosis not present

## 2019-05-19 NOTE — Patient Instructions (Signed)
    Memory Compensation Strategies  1. Use "WARM" strategy.  W= write it down  A= associate it  R= repeat it  M= make a mental note  2.   You can keep a Glass blower/designer.  Use a 3-ring notebook with sections for the following: calendar, important names and phone numbers,  medications, doctors' names/phone numbers, lists/reminders, and a section to journal what you did  each day.   3.    Use a calendar to write appointments down.  4.    Write yourself a schedule for the day.  This can be placed on the calendar or in a separate section of the Memory Notebook.  Keeping a  regular schedule can help memory.  5.    Use medication organizer with sections for each day or morning/evening pills.  You may need help loading it  6.    Keep a basket, or pegboard by the door.  Place items that you need to take out with you in the basket or on the pegboard.  You may also want to  include a message board for reminders.  7.    Use sticky notes.  Place sticky notes with reminders in a place where the task is performed.  For example: " turn off the  stove" placed by the stove, "lock the door" placed on the door at eye level, " take your medications" on  the bathroom mirror or by the place where you normally take your medications.  8.    Use alarms/timers.  Use while cooking to remind yourself to check on food or as a reminder to take your medicine, or as a  reminder to make a call, or as a reminder to perform another task, etc.           Coordination Activities  Perform the following activities for 20 minutes 1 times per day with both hand(s).   Rotate ball in fingertips (clockwise and counter-clockwise).  Toss ball between hands.  Toss ball in air and catch with the same hand.  Flip cards 1 at a time as fast as you can.  Deal cards with your thumb (Hold deck in hand and push card off top with thumb).  Rotate card in hand (clockwise and counter-clockwise).  Pick up coins and  stack.  Pick up coins one at a time until you get 5-10 in your hand, then move coins from palm to fingertips to stack one at a time.   1. Grip Strengthening (Resistive Putty)   Squeeze putty using thumb and all fingers. Repeat _20___ times. Do __2__ sessions per day.   2. Roll putty into tube on table and pinch between each finger and thumb x 10 reps each. (can do ring and small finger together)     Copyright  VHI. All rights reserved.

## 2019-05-19 NOTE — Therapy (Signed)
[facility] [address] [place], [place], [address] Phone: [phone]   Fax:  [phone]  Speech Language Pathology Treatment  Patient Details  Name: Jean Carlson MRN: [id number] Date of Birth: February 13, 2000 Referring Provider (SLP): [place], D. (referral); Dena Billet, [place] (documentation)   Encounter Date: 05/19/2019  End of Session - 05/19/19 1721    Visit Number  2    Number of Visits  13    Date for SLP Re-Evaluation  07/07/19    SLP Start Time  30    SLP Stop Time   1100    SLP Time Calculation (min)  40 min    Activity Tolerance  Patient tolerated treatment well       Past Medical History:  Diagnosis Date  . ADHD     Past Surgical History:  Procedure Laterality Date  . WISDOM TOOTH EXTRACTION     x4    There were no vitals filed for this visit.  Subjective Assessment - 05/19/19 1027    Subjective  "I had PT first and then OT."    Currently in Pain?  No/denies            ADULT SLP TREATMENT - 05/19/19 1029      General Information   Behavior/Cognition  Alert;Cooperative;Pleasant mood      Treatment Provided   Treatment provided  Cognitive-Linquistic      Cognitive-Linquistic Treatment   Treatment focused on  Dysarthria    Skilled Treatment  PT QOL score is 30 - good to excellent QOL. Pt read sentences focusing on strategies for clearer speech with 85% success exaggeration of articulation and slowed rate. Pt's speech did improve in quality when using the strategies, however SLP questions pt's use of these until she experiences more difficulty.       Assessment / Recommendations / Plan   Plan  Continue with current plan of care      Progression Toward Goals   Progression toward goals  Progressing toward goals       SLP Education - 05/19/19 1721    Education Details  reviewed speech intelligbility strategies    Person(s) Educated  Patient    Methods  Explanation;Verbal  cues;Demonstration    Comprehension  Verbalized understanding;Returned demonstration;Need further instruction;Verbal cues required       SLP Short Term Goals - 05/19/19 1723      SLP SHORT TERM GOAL #1   Title  pt will demo speech intelligibilty strategies in 10 minutes simple conversation x2 sessions    Time  3    Period  Weeks    Status  On-going      SLP SHORT TERM GOAL #2   Title  pt will complete speech quality of life measure    Status  Achieved       SLP Long Term Goals - 05/19/19 1723      SLP LONG TERM GOAL #1   Title  pt will use compensatory strategies for 95% speech intelligibility in 15 minutes mod complex conversation x3 sessions    Time  6    Period  Weeks   or 13 total sessions, for all LTGs   Status  On-going      SLP LONG TERM GOAL #2   Title  pt will report a higher QOL score than in first session    Time  6    Period  Weeks    Status  On-going      SLP  LONG TERM GOAL #3   Title  pt will report using at least one functional strategy for stamina management re: voice/speech intelligibility between 3 sessions    Time  6    Period  Weeks    Status  On-going       Plan - 05/19/19 1722    Clinical Impression Statement  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 Pt with premorbid dx of ADHD;at baseline per pt and mother. Pt practiced some speech intelligibility strategies to use in reading sentences today.    Speech Therapy Frequency  2x / week   6 weeks   Duration  --   6 weeks, or 13 total visits   Treatment/Interventions  Functional tasks;Compensatory techniques;SLP instruction and feedback;Patient/family education;Internal/external aids;Multimodal communcation approach    Potential to Achieve Goals  Good       Patient will benefit from skilled therapeutic intervention in order to improve the following deficits and impairments:   Dysarthria and anarthria    Problem  List Patient Active Problem List   Diagnosis Date Noted  . High risk medication use 05/03/2019  . Vitamin D deficiency 05/03/2019  . Depression with anxiety 05/03/2019  . Class 1 obesity due to excess calories without serious comorbidity with body mass index (BMI) of 31.0 to 31.9 in adult   . Neurogenic bowel   . Hypokalemia   . Neurogenic bladder 04/18/2019  . Demyelinating disorder (HCC) 04/12/2019  . ADHD 04/12/2019  . Multiple sclerosis (HCC) 04/10/2019  . Tobacco abuse 04/10/2019    [facility] ,MS, CCC-SLP  05/19/2019, 5:24 PM  [facility] [address] [place], [place], [address] Phone: [phone]   Fax:  [phone]   Name: LUVA METZGER MRN: [id number] Date of Birth: 2000/12/31

## 2019-05-19 NOTE — Therapy (Signed)
[facility] [address] [place], [place], [address] Phone: [phone]   Fax:  [phone]  Occupational Therapy Treatment  Patient Details  Name: Jean Carlson MRN: [id number] Date of Birth: September 06, 2000 Referring Provider (OT): Dr. Berline Chough   Encounter Date: 05/19/2019  OT End of Session - 05/19/19 0852    Visit Number  2    Number of Visits  17    Date for OT Re-Evaluation  07/01/19    Authorization Type  BCBS    OT Start Time  0849    OT Stop Time  0927    OT Time Calculation (min)  38 min    Activity Tolerance  Patient tolerated treatment well    Behavior During Therapy  [facility] for tasks assessed/performed       Past Medical History:  Diagnosis Date  . ADHD     Past Surgical History:  Procedure Laterality Date  . WISDOM TOOTH EXTRACTION     x4    There were no vitals filed for this visit.  Subjective Assessment - 05/19/19 0852    Subjective   Pt reports she has returned to softball    Currently in Pain?  No/denies                           OT Treatment/ Education - 05/19/19 0924    Education Details  memory compensation strategies, coordination HEP, green putty HEP,    Person(s) Educated  Patient    Methods  Explanation;Demonstration;Handout;Verbal cues    Comprehension  Verbalized understanding;Returned demonstration;Verbal cues required       OT Short Term Goals - 05/19/19 0912      OT SHORT TERM GOAL #1   Title  I with HEP for coordination and UE strength-06/01/19    Time  4    Period  Weeks    Status  On-going    Target Date  06/01/19      OT SHORT TERM GOAL #2   Title  Pt will verbalize understanding of energy conservation techniques    Time  4    Period  Weeks    Status  On-going      OT SHORT TERM GOAL #3   Title  Pt will verbalize understanding of compensatory strategies for short term memory and cognitive deficits.    Time  4    Period  Weeks    Status   On-going      OT SHORT TERM GOAL #4   Title  Pt will verbalize understanding of benefits of a routine/ daily schedule and utilize modified independently    Time  4    Period  Weeks    Status  On-going      OT SHORT TERM GOAL #5   Title  Pt will increase LUE grip strength by 5 lbs for improved LUE functional use during ADLs.    Baseline  RUE 76.2, LUE 56.2    Time  4    Period  Weeks    Status  New        OT Long Term Goals - 05/19/19 0912      OT LONG TERM GOAL #1   Title  Pt will demonstrate ability to perform a cognitive task and physical task simultaneously with 90% or better accuracy in prep for driving.    Time  8    Period  Weeks    Status  On-going  OT LONG TERM GOAL #2   Title  Pt will demonstrate improved fine motor coordination for ADLS as evidenced by decreasing 9 hole peg test score to 35 secs or less    Baseline  RUE 27.85, LUE 39.19    Time  8    Period  Weeks    Status  On-going      OT LONG TERM GOAL #3   Title  Pt will demonstrate improved LUE functional use for ADLs as evidenced by increasing box/ blocks score to 48 blocks or greater.    Baseline  RUE 53, LUE 43    Time  8    Period  Weeks    Status  On-going      OT LONG TERM GOAL #4   Title  Pt will verbalize understanding of MS related resources    Time  8    Period  Weeks    Status  On-going      OT LONG TERM GOAL #5   Title  Pt will perform simple stove top cooking task modified I demonstrating good safety awareness.    Time  4    Period  Weeks    Status  On-going            Plan - 05/19/19 0981    Clinical Impression Statement  Pt is progresssing towards goals. She reports overall she has improved however she still continues to have some decreased dexterity.    OT Occupational Profile and History  Detailed Assessment- Review of Records and additional review of physical, cognitive, psychosocial history related to current functional performance    Occupational performance  deficits (Please refer to evaluation for details):  ADL's;IADL's;Leisure;Social Participation;Education    Body Structure / Function / Physical Skills  ADL;Strength;Mobility;Balance;UE functional use;FMC;Coordination;Gait;GMC;Decreased knowledge of precautions;Decreased knowledge of use of DME;Dexterity;IADL;Sensation    Cognitive Skills  Attention;Energy/Drive;Memory;Safety Awareness;Sequencing;Thought    Rehab Potential  Good    Clinical Decision Making  Limited treatment options, no task modification necessary    Comorbidities Affecting Occupational Performance:  May have comorbidities impacting occupational performance    Modification or Assistance to Complete Evaluation   No modification of tasks or assist necessary to complete eval    OT Frequency  2x / week    OT Duration  8 weeks    OT Treatment/Interventions  Self-care/ADL training;Therapeutic exercise;Functional Mobility Training;Balance training;Manual Therapy;Neuromuscular education;Aquatic Therapy;Energy conservation;Therapeutic activities;Cognitive remediation/compensation;Cryotherapy;Paraffin;Visual/perceptual remediation/compensation;Fluidtherapy;Moist Heat;Passive range of motion;Patient/family education    Plan  HEP for fine motor coordination, test grip strength and set goal prn    Consulted and Agree with Plan of Care  Patient;Family member/caregiver    Family Member Consulted  mother       Patient will benefit from skilled therapeutic intervention in order to improve the following deficits and impairments:   Body Structure / Function / Physical Skills: ADL, Strength, Mobility, Balance, UE functional use, FMC, Coordination, Gait, GMC, Decreased knowledge of precautions, Decreased knowledge of use of DME, Dexterity, IADL, Sensation Cognitive Skills: Attention, Energy/Drive, Memory, Safety Awareness, Sequencing, Thought     Visit Diagnosis: Other lack of coordination  Other symptoms and signs involving the nervous  system  Other abnormalities of gait and mobility  Muscle weakness (generalized)  Attention and concentration deficit  Frontal lobe and executive function deficit    Problem List Patient Active Problem List   Diagnosis Date Noted  . High risk medication use 05/03/2019  . Vitamin D deficiency 05/03/2019  . Depression with anxiety 05/03/2019  . Class  1 obesity due to excess calories without serious comorbidity with body mass index (BMI) of 31.0 to 31.9 in adult   . Neurogenic bowel   . Hypokalemia   . Neurogenic bladder 04/18/2019  . Demyelinating disorder (HCC) 04/12/2019  . ADHD 04/12/2019  . Multiple sclerosis (HCC) 04/10/2019  . Tobacco abuse 04/10/2019    Bettyann Birchler 05/19/2019, 9:28 AM Keene Breath, OTR/L [phone] Phone: [phone] 9:29 AM 05/19/19 [facility] [address] [place], [place], [address] Phone: [phone]   Fax:  [phone]  Name: Jean Carlson MRN: [id number] Date of Birth: 2000-03-01

## 2019-05-20 NOTE — Therapy (Signed)
[facility] [address] [place], [place], [address] Phone: [phone]   Fax:  [phone]  Physical Therapy Treatment  Patient Details  Name: Jean Carlson MRN: [id number] Date of Birth: 03-Aug-2000 Referring Provider (PT): Cathlyn Parsons, PA-C   Encounter Date: 05/19/2019  PT End of Session - 05/19/19 0935    Visit Number  7    Number of Visits  17    Date for PT Re-Evaluation  06/26/19    Authorization Type  BCBS    PT Start Time  0931    PT Stop Time  1012    PT Time Calculation (min)  41 min    Equipment Utilized During Treatment  Gait belt    Activity Tolerance  Patient tolerated treatment well    Behavior During Therapy  [facility] for tasks assessed/performed       Past Medical History:  Diagnosis Date  . ADHD     Past Surgical History:  Procedure Laterality Date  . WISDOM TOOTH EXTRACTION     x4    There were no vitals filed for this visit.  Subjective Assessment - 05/19/19 0932    Subjective  No new complaints. Now playing right center in [place] where she does not have to run much. With batting she only goes to first running, others she can walk most times. She can sub someone in for her after hitting if she needs too. Reports her coach pays attention and will pull her out if she appears to be tired or overheated. Has her cooling vest, however the weather has been cooler as well so not sure how well it is working.    Pertinent History  ADHD    Patient Stated Goals  Play softball, play with the dog    Pain Score  0-No pain            [facility] Adult PT Treatment/Exercise - 05/19/19 1884      Ambulation/Gait   Ambulation/Gait  Yes    Ambulation/Gait Assistance  6: Modified independent (Device/Increase time)    Ambulation Distance (Feet)  --   around gym with session   Assistive device  None    Gait Pattern  Step-through pattern;Decreased stance time - left;Right genu recurvatum;Left genu  recurvatum    Ambulation Surface  Level;Indoor      Neuro Re-ed    Neuro Re-ed Details   for balance/muscle re-ed/coordination: gait around track working on speed changes and scanning all directions with min guard assist. mild shortness of breath after 3 laps.  in parallel bars: with BOSU- alternating fwd step ups with contralateral march for 10 reps each, lateral step ups with contralateral march for 2 sets 5 reps, bil UE support with cues for "soft knees"; on inerted BOSU- rocking fwd/bwd, then laterally for 10 reps each, mini squats for 10 reps. bil UE support with cues on posture, technique and "soft knees"          Balance Exercises - 05/19/19 0945      Balance Exercises: Standing   Standing Eyes Closed  Narrow base of support (BOS);Wide (BOA);Head turns;Foam/compliant surface;Other reps (comment);30 secs;Limitations    Standing Eyes Closed Limitations  standing across blue foam beam: feet together for EC no head movements, progressing to feet apart for EC head movements left<>right, up<>down. min guard assist for balance    Balance Beam  standing across blue foam beam, no UE support: alternating fwd stepping to floor/back onto beam, then alternating bwd  stepping to floor/back onto beam. min guard assist with cues for increased step length/step height.      Tandem Gait  Forward;Retro;Foam/compliant surface;3 reps;Limitations    Tandem Gait Limitations  on blue foam beam for 3 laps each with intermittent touch to bars for balance    Sidestepping  Foam/compliant support;3 reps;Limitations    Sidestepping Limitations  on blue foam beam for 3 laps each way, intermittent touch to bars.          PT Short Term Goals - 04/27/19 2140      PT SHORT TERM GOAL #1   Title  Pt will initiate and demonstrate compliance with land based HEP.    Time  4    Period  Weeks    Status  New    Target Date  05/27/19      PT SHORT TERM GOAL #2   Title  Pt will improve BERG balance to 56/56     Baseline  53/56    Time  4    Period  Weeks    Status  New    Target Date  05/27/19      PT SHORT TERM GOAL #3   Title  Pt will improve FGA to 28/30    Baseline  24/30    Time  4    Period  Weeks    Status  New    Target Date  05/27/19        PT Long Term Goals - 04/27/19 2143      PT LONG TERM GOAL #1   Title  Pt will demonstrate compliance with land and aquatic HEP    Time  8    Period  Weeks    Status  New    Target Date  06/26/19      PT LONG TERM GOAL #2   Title  Pt will improve FGA to 30/30 to indicate low falls risk    Time  8    Period  Weeks    Status  New    Target Date  06/26/19      PT LONG TERM GOAL #3   Title  Pt will demonstrate ability to transfer safely floor <> stand MOD I multiple times    Time  8    Period  Weeks    Status  New    Target Date  06/26/19      PT LONG TERM GOAL #4   Title  Pt will demonstrate ability to catch/throw softball and jog in grass x 200' without LOB    Time  8    Period  Weeks    Status  New    Target Date  06/26/19            Plan - 05/19/19 0936    Clinical Impression Statement  Today's skilled session continued to focus on dynamic gait and high level balance reactions. Continued to reinforce energy conservation through out session.  The pt is progressing toward goals and should benefit from continued PT to progress toward unmet goals.    Personal Factors and Comorbidities  Comorbidity 1    Comorbidities  ADHD    Examination-Activity Limitations  Locomotion Level;Stairs    Examination-Participation Restrictions  Community Activity;Driving    Stability/Clinical Decision Making  Stable/Uncomplicated    Rehab Potential  Excellent    PT Frequency  2x / week    PT Duration  8 weeks    PT Treatment/Interventions  ADLs/Self Care Home Management;Aquatic Therapy;Lobbyist  Stimulation;Gait training;Stair training;Functional mobility training;Therapeutic activities;Therapeutic exercise;Balance training;Neuromuscular  re-education;Patient/family education;Orthotic Fit/Training    PT Next Visit Plan  Continue balance and strength and agility. Softball outside on grass.  Trial Bioness for strengthening.    Consulted and Agree with Plan of Care  Patient       Patient will benefit from skilled therapeutic intervention in order to improve the following deficits and impairments:  Abnormal gait, Decreased balance, Decreased coordination, Decreased strength, Difficulty walking, Impaired tone  Visit Diagnosis: Repeated falls  Other symptoms and signs involving the nervous system  Other abnormalities of gait and mobility  Muscle weakness (generalized)     Problem List Patient Active Problem List   Diagnosis Date Noted  . High risk medication use 05/03/2019  . Vitamin D deficiency 05/03/2019  . Depression with anxiety 05/03/2019  . Class 1 obesity due to excess calories without serious comorbidity with body mass index (BMI) of 31.0 to 31.9 in adult   . Neurogenic bowel   . Hypokalemia   . Neurogenic bladder 04/18/2019  . Demyelinating disorder (HCC) 04/12/2019  . ADHD 04/12/2019  . Multiple sclerosis (HCC) 04/10/2019  . Tobacco abuse 04/10/2019    Sallyanne Kuster, PTA, [facility] [address], [address] [place], [place] [address] [phone] 05/20/19, 1:00 PM   Name: Jean Carlson MRN: [id number] Date of Birth: January 23, 2001

## 2019-05-22 ENCOUNTER — Ambulatory Visit: Payer: BC Managed Care – PPO | Admitting: Physical Therapy

## 2019-05-22 ENCOUNTER — Encounter: Payer: Self-pay | Admitting: Physical Therapy

## 2019-05-22 ENCOUNTER — Other Ambulatory Visit: Payer: Self-pay

## 2019-05-22 DIAGNOSIS — R296 Repeated falls: Secondary | ICD-10-CM

## 2019-05-22 DIAGNOSIS — R278 Other lack of coordination: Secondary | ICD-10-CM | POA: Diagnosis not present

## 2019-05-22 DIAGNOSIS — R41844 Frontal lobe and executive function deficit: Secondary | ICD-10-CM | POA: Diagnosis not present

## 2019-05-22 DIAGNOSIS — R29818 Other symptoms and signs involving the nervous system: Secondary | ICD-10-CM | POA: Diagnosis not present

## 2019-05-22 DIAGNOSIS — R41840 Attention and concentration deficit: Secondary | ICD-10-CM | POA: Diagnosis not present

## 2019-05-22 DIAGNOSIS — M6281 Muscle weakness (generalized): Secondary | ICD-10-CM

## 2019-05-22 DIAGNOSIS — R2681 Unsteadiness on feet: Secondary | ICD-10-CM | POA: Diagnosis not present

## 2019-05-22 DIAGNOSIS — R471 Dysarthria and anarthria: Secondary | ICD-10-CM | POA: Diagnosis not present

## 2019-05-22 DIAGNOSIS — R2689 Other abnormalities of gait and mobility: Secondary | ICD-10-CM

## 2019-05-22 NOTE — Therapy (Signed)
[facility] [address] [place], [place], [address] Phone: [phone]   Fax:  [phone]  Physical Therapy Treatment  Patient Details  Name: Jean Carlson MRN: [id number] Date of Birth: 2000/10/26 Referring Provider (PT): Charlton Amor, PA-C   Encounter Date: 05/22/2019  PT End of Session - 05/22/19 1812    Visit Number  8    Number of Visits  17    Date for PT Re-Evaluation  06/26/19    Authorization Type  BCBS    PT Start Time  1415    PT Stop Time  1500    PT Time Calculation (min)  45 min    Equipment Utilized During Treatment  Other (comment)   pool noodle, arm barbells, buoyancy cuffs   Activity Tolerance  Patient tolerated treatment well    Behavior During Therapy  [facility] for tasks assessed/performed       Past Medical History:  Diagnosis Date  . ADHD     Past Surgical History:  Procedure Laterality Date  . WISDOM TOOTH EXTRACTION     x4    There were no vitals filed for this visit.  Subjective Assessment - 05/22/19 1811    Subjective  Denies any falls or changes. Played softball Friday for 2 games but only played 2 innings of each.  Still has difficulty running and vision impacting catching/seeing ball.    Pertinent History  ADHD    Patient Stated Goals  Play softball, play with the dog    Currently in Pain?  No/denies       Aquatic therapy at [facility] - pool temp 86.7degrees  Patient seen for aquatic therapy today. Treatment took place in water 3.5-4 feet deep depending upon activity. Pt entered and exited the pool via ramp and supervisions.   Runners stretch bil LE's and toes/feet up edge of pool x 30 seconds each bil LE'sboth at beginning and end of session.  Pt performed gait training in pool forwards 48m x 2 repswithout UE support,41m x 2 working on increased armswing,60m x 2 marchingthen 57m x 2 repsbackwards. Performed side stepping squatsx 72m x 2 with arm bar bells for  resistance.Cues for technique/sequence.  Jogging 44m x 2 with rest break in between.  Standing withoutUE support LE marching, hip extension, hip abd, hamstring curland straight leg hip flexionwith buoyancy cuffs. Pt able to perform 15 reps alternating LE's each side. Performed 15 reps each of hip flexion pushing straight into hip extension x 15 reps each side.  Squats x 15repswithout UE assist then single leg x 15 reps each.  Supine (without use of  pool noodle today) for bicyling x 20m x 2. PTA providingclosed supervision whilein this position. In same position without for hip abd/add along with UE abd/ add x 66m x 2.   Ai Chi postures of enclosing, soothing and freeing x 15 reps each.  Swam under water x 15' x 6 reps with supervision.  Pt requires buoyancy for support with balance and viscosity for resistance for strengthening exercises; buoyancy is also needed for off loading body to assist with exercises.   PT Education - 05/22/19 1815    Education Details  Energy conservation    Person(s) Educated  Patient    Methods  Explanation    Comprehension  Verbalized understanding;Other (comment)   needs continued reinforcement      PT Short Term Goals - 04/27/19 2140      PT SHORT TERM GOAL #1   Title  Pt will initiate and demonstrate compliance with land based HEP.    Time  4    Period  Weeks    Status  New    Target Date  05/27/19      PT SHORT TERM GOAL #2   Title  Pt will improve BERG balance to 56/56    Baseline  53/56    Time  4    Period  Weeks    Status  New    Target Date  05/27/19      PT SHORT TERM GOAL #3   Title  Pt will improve FGA to 28/30    Baseline  24/30    Time  4    Period  Weeks    Status  New    Target Date  05/27/19        PT Long Term Goals - 04/27/19 2143      PT LONG TERM GOAL #1   Title  Pt will demonstrate compliance with land and aquatic HEP    Time  8    Period  Weeks    Status  New    Target Date   06/26/19      PT LONG TERM GOAL #2   Title  Pt will improve FGA to 30/30 to indicate low falls risk    Time  8    Period  Weeks    Status  New    Target Date  06/26/19      PT LONG TERM GOAL #3   Title  Pt will demonstrate ability to transfer safely floor <> stand MOD I multiple times    Time  8    Period  Weeks    Status  New    Target Date  06/26/19      PT LONG TERM GOAL #4   Title  Pt will demonstrate ability to catch/throw softball and jog in grass x 200' without LOB    Time  8    Period  Weeks    Status  New    Target Date  06/26/19            Plan - 05/22/19 1813    Clinical Impression Statement  Skilled aquatic session continues to focus on strength, balance, endurance and flexibility.  Pt progressing well.  Works hard during sessions.  Continue to educate pt regarding energy conservation and needs reinforcement.  Cont PT per POC.    Personal Factors and Comorbidities  Comorbidity 1    Comorbidities  ADHD    Examination-Activity Limitations  Locomotion Level;Stairs    Examination-Participation Restrictions  Community Activity;Driving    Stability/Clinical Decision Making  Stable/Uncomplicated    Rehab Potential  Excellent    PT Frequency  2x / week    PT Duration  8 weeks    PT Treatment/Interventions  ADLs/Self Care Home Management;Aquatic Therapy;Electrical Stimulation;Gait training;Stair training;Functional mobility training;Therapeutic activities;Therapeutic exercise;Balance training;Neuromuscular re-education;Patient/family education;Orthotic Fit/Training    PT Next Visit Plan  Continue balance and strength and agility. Softball outside on grass.  Trial Bioness for strengthening.    Consulted and Agree with Plan of Care  Patient       Patient will benefit from skilled therapeutic intervention in order to improve the following deficits and impairments:  Abnormal gait, Decreased balance, Decreased coordination, Decreased strength, Difficulty walking, Impaired  tone  Visit Diagnosis: Repeated falls  Other symptoms and signs involving the nervous system  Other abnormalities of gait and mobility  Muscle weakness (generalized)  Problem List Patient Active Problem List   Diagnosis Date Noted  . High risk medication use 05/03/2019  . Vitamin D deficiency 05/03/2019  . Depression with anxiety 05/03/2019  . Class 1 obesity due to excess calories without serious comorbidity with body mass index (BMI) of 31.0 to 31.9 in adult   . Neurogenic bowel   . Hypokalemia   . Neurogenic bladder 04/18/2019  . Demyelinating disorder (HCC) 04/12/2019  . ADHD 04/12/2019  . Multiple sclerosis (HCC) 04/10/2019  . Tobacco abuse 04/10/2019    Newell Coral, PTA [facility] 05/22/19 6:20 PM Phone: [phone] Fax: [phone]   [facility] [address] [place], [place], [address] Phone: [phone]   Fax:  [phone]  Name: Jean Carlson MRN: [id number] Date of Birth: 2000-11-16

## 2019-05-23 ENCOUNTER — Ambulatory Visit: Payer: BC Managed Care – PPO

## 2019-05-23 ENCOUNTER — Ambulatory Visit: Payer: BC Managed Care – PPO | Admitting: Occupational Therapy

## 2019-05-23 ENCOUNTER — Encounter: Payer: Self-pay | Admitting: Occupational Therapy

## 2019-05-23 DIAGNOSIS — R296 Repeated falls: Secondary | ICD-10-CM | POA: Diagnosis not present

## 2019-05-23 DIAGNOSIS — R278 Other lack of coordination: Secondary | ICD-10-CM

## 2019-05-23 DIAGNOSIS — R2689 Other abnormalities of gait and mobility: Secondary | ICD-10-CM | POA: Diagnosis not present

## 2019-05-23 DIAGNOSIS — M6281 Muscle weakness (generalized): Secondary | ICD-10-CM

## 2019-05-23 DIAGNOSIS — R471 Dysarthria and anarthria: Secondary | ICD-10-CM | POA: Diagnosis not present

## 2019-05-23 DIAGNOSIS — R41840 Attention and concentration deficit: Secondary | ICD-10-CM

## 2019-05-23 DIAGNOSIS — R2681 Unsteadiness on feet: Secondary | ICD-10-CM | POA: Diagnosis not present

## 2019-05-23 DIAGNOSIS — R29818 Other symptoms and signs involving the nervous system: Secondary | ICD-10-CM

## 2019-05-23 DIAGNOSIS — R41844 Frontal lobe and executive function deficit: Secondary | ICD-10-CM

## 2019-05-23 NOTE — Therapy (Signed)
[facility] [address] [place], [place], [address] Phone: [phone]   Fax:  [phone]  Occupational Therapy Treatment  Patient Details  Name: Jean Carlson MRN: [id number] Date of Birth: 14-May-2000 Referring Provider (OT): Dr. Berline Chough   Encounter Date: 05/23/2019  OT End of Session - 05/23/19 0947    Visit Number  3    Number of Visits  17    Date for OT Re-Evaluation  07/01/19    Authorization Type  BCBS    OT Start Time  [phone]    OT Stop Time  1014    OT Time Calculation (min)  38 min    Activity Tolerance  Patient tolerated treatment well    Behavior During Therapy  [facility] for tasks assessed/performed       Past Medical History:  Diagnosis Date  . ADHD     Past Surgical History:  Procedure Laterality Date  . WISDOM TOOTH EXTRACTION     x4    There were no vitals filed for this visit.  Subjective Assessment - 05/23/19 0936    Subjective   Pt reports she was very fatigued after multiple therapies the other day    Currently in Pain?  No/denies                Treatment: Copying small peg design with LUE for coordination, cognition  and visual scanning pt copied design correctly and she demonstrates improved coordination, removing pegs with in hand manipulation. Environmental scanning task as pt reports she has returned to driving. Pt missed 6 items on first pass, she located 4 additional cards on second pass, then pt located remaining 2 items ( 10/16 located initally 63% accuracy) Therapist encouraged pt to exercise caution with driving as a result of pt errors with environmental scanning. Pt verbalized understanding. Gripper set a level 3 to pick up 1 inch blocks for sustained grip, min difficulty and several rest breaks required.              OT Short Term Goals - 05/19/19 0912      OT SHORT TERM GOAL #1   Title  I with HEP for coordination and UE strength-06/01/19    Time  4    Period  Weeks    Status  On-going    Target Date  06/01/19      OT SHORT TERM GOAL #2   Title  Pt will verbalize understanding of energy conservation techniques    Time  4    Period  Weeks    Status  On-going      OT SHORT TERM GOAL #3   Title  Pt will verbalize understanding of compensatory strategies for short term memory and cognitive deficits.    Time  4    Period  Weeks    Status  On-going      OT SHORT TERM GOAL #4   Title  Pt will verbalize understanding of benefits of a routine/ daily schedule and utilize modified independently    Time  4    Period  Weeks    Status  On-going      OT SHORT TERM GOAL #5   Title  Pt will increase LUE grip strength by 5 lbs for improved LUE functional use during ADLs.    Baseline  RUE 76.2, LUE 56.2    Time  4    Period  Weeks    Status  New  [facility] [address] [place], [place], [address] Phone: [phone]   Fax:  [phone]  Occupational Therapy Treatment  Patient Details  Name: Jean Carlson MRN: [id number] Date of Birth: 14-May-2000 Referring Provider (OT): Dr. Berline Chough   Encounter Date: 05/23/2019  OT End of Session - 05/23/19 0947    Visit Number  3    Number of Visits  17    Date for OT Re-Evaluation  07/01/19    Authorization Type  BCBS    OT Start Time  [phone]    OT Stop Time  1014    OT Time Calculation (min)  38 min    Activity Tolerance  Patient tolerated treatment well    Behavior During Therapy  [facility] for tasks assessed/performed       Past Medical History:  Diagnosis Date  . ADHD     Past Surgical History:  Procedure Laterality Date  . WISDOM TOOTH EXTRACTION     x4    There were no vitals filed for this visit.  Subjective Assessment - 05/23/19 0936    Subjective   Pt reports she was very fatigued after multiple therapies the other day    Currently in Pain?  No/denies                Treatment: Copying small peg design with LUE for coordination, cognition  and visual scanning pt copied design correctly and she demonstrates improved coordination, removing pegs with in hand manipulation. Environmental scanning task as pt reports she has returned to driving. Pt missed 6 items on first pass, she located 4 additional cards on second pass, then pt located remaining 2 items ( 10/16 located initally 63% accuracy) Therapist encouraged pt to exercise caution with driving as a result of pt errors with environmental scanning. Pt verbalized understanding. Gripper set a level 3 to pick up 1 inch blocks for sustained grip, min difficulty and several rest breaks required.              OT Short Term Goals - 05/19/19 0912      OT SHORT TERM GOAL #1   Title  I with HEP for coordination and UE strength-06/01/19    Time  4    Period  Weeks    Status  On-going    Target Date  06/01/19      OT SHORT TERM GOAL #2   Title  Pt will verbalize understanding of energy conservation techniques    Time  4    Period  Weeks    Status  On-going      OT SHORT TERM GOAL #3   Title  Pt will verbalize understanding of compensatory strategies for short term memory and cognitive deficits.    Time  4    Period  Weeks    Status  On-going      OT SHORT TERM GOAL #4   Title  Pt will verbalize understanding of benefits of a routine/ daily schedule and utilize modified independently    Time  4    Period  Weeks    Status  On-going      OT SHORT TERM GOAL #5   Title  Pt will increase LUE grip strength by 5 lbs for improved LUE functional use during ADLs.    Baseline  RUE 76.2, LUE 56.2    Time  4    Period  Weeks    Status  New  and executive function deficit  Attention and concentration deficit  Other lack of coordination  Muscle weakness (generalized)  Other abnormalities of gait and mobility    Problem List Patient Active Problem List   Diagnosis Date Noted  . High risk medication use 05/03/2019  . Vitamin D deficiency 05/03/2019  . Depression with anxiety 05/03/2019  . Class 1 obesity due to excess calories without serious comorbidity with body mass index (BMI) of 31.0 to 31.9 in adult   . Neurogenic bowel   . Hypokalemia   . Neurogenic bladder 04/18/2019  . Demyelinating disorder ([place]) 04/12/2019  . ADHD 04/12/2019  . Multiple sclerosis ([place]) 04/10/2019  . Tobacco abuse 04/10/2019    Nyeema Want 05/23/2019, 9:48 AM Theone Murdoch, OTR/L [phone] Phone: [phone] 9:48 AM 05/23/19 [place] [address] [place], [place], [address] Phone: [phone]   Fax:  [phone]  Name: KANYAH MATSUSHIMA MRN: [id number] Date of Birth: 04/12/2000

## 2019-05-23 NOTE — Therapy (Signed)
[place] [address] [place], [place], [address] Phone: [phone]   Fax:  [phone]  Speech Language Pathology Treatment  Patient Details  Name: Jean Carlson MRN: [id number] Date of Birth: 10/14/2000 Referring Provider (SLP): Valier, D. (referral); Dena Billet, [place] (documentation)   Encounter Date: 05/23/2019  End of Session - 05/23/19 1732    Visit Number  3    Number of Visits  13    Date for SLP Re-Evaluation  07/07/19    SLP Start Time  0850    SLP Stop Time   0930    SLP Time Calculation (min)  40 min    Activity Tolerance  Patient tolerated treatment well       Past Medical History:  Diagnosis Date  . ADHD     Past Surgical History:  Procedure Laterality Date  . WISDOM TOOTH EXTRACTION     x4    There were no vitals filed for this visit.         ADULT SLP TREATMENT - 05/23/19 0924      General Information   Behavior/Cognition  Alert;Cooperative;Pleasant mood      Treatment Provided   Treatment provided  Cognitive-Linquistic      Cognitive-Linquistic Treatment   Treatment focused on  Dysarthria    Skilled Treatment  SLP explained fatigue/energy conservation in regards to speech today. Pt reported very little difficulty with her speech since last session. SLP practiced with pt in reading selections of 8-10 sentences with articulatory compensations for dysarthria. Pt successful with slowed rate and overarticulation 75% of the time. SLP ?s if pt sees benefit of this when she has not really experienced dysarthria since hospital stay.       Assessment / Recommendations / Plan   Plan  Continue with current plan of care      Progression Toward Goals   Progression toward goals  Progressing toward goals       SLP Education - 05/23/19 1736    Education Details  energy conservation re: speech intelligbility    Person(s) Educated  Patient    Methods  Explanation    Comprehension   Verbalized understanding       SLP Short Term Goals - 05/23/19 1734      SLP SHORT TERM GOAL #1   Title  pt will demo speech intelligibilty strategies in 10 minutes simple conversation x2 sessions    Baseline  05-23-19    Time  2    Period  Weeks    Status  On-going      SLP SHORT TERM GOAL #2   Title  pt will complete speech quality of life measure    Status  Achieved       SLP Long Term Goals - 05/23/19 1734      SLP LONG TERM GOAL #1   Title  pt will use compensatory strategies for 95% speech intelligibility in 15 minutes mod complex conversation x3 sessions    Time  5    Period  Weeks   or 13 total sessions, for all LTGs   Status  On-going      SLP LONG TERM GOAL #2   Title  pt will report a higher QOL score than in first session    Time  5    Period  Weeks    Status  On-going      SLP LONG TERM GOAL #3   Title  pt will report using at least  one functional strategy for stamina management re: voice/speech intelligibility between 3 sessions    Time  5    Period  Weeks    Status  On-going       Plan - 05/23/19 1732    Clinical Impression Statement  Pt presents with WNL speech today; pt reports slightly worse with excitment, anger and fatigue. Today however when pt talking with incr'd excitement she remained without dysarthria - ST first today and not last, as previous session. SLP to ocnsider decr to once/week or once/every other week. Pt with premorbid dx of ADHD;at baseline per pt and mother. Pt my cont to benefit from skilled ST targeting her intermittent dysarthria.    Speech Therapy Frequency  2x / week   6 weeks   Duration  --   6 weeks, or 13 total visits   Treatment/Interventions  Functional tasks;Compensatory techniques;SLP instruction and feedback;Patient/family education;Internal/external aids;Multimodal communcation approach    Potential to Achieve Goals  Good       Patient will benefit from skilled therapeutic intervention in order to improve the  following deficits and impairments:   Dysarthria and anarthria    Problem List Patient Active Problem List   Diagnosis Date Noted  . High risk medication use 05/03/2019  . Vitamin D deficiency 05/03/2019  . Depression with anxiety 05/03/2019  . Class 1 obesity due to excess calories without serious comorbidity with body mass index (BMI) of 31.0 to 31.9 in adult   . Neurogenic bowel   . Hypokalemia   . Neurogenic bladder 04/18/2019  . Demyelinating disorder (HCC) 04/12/2019  . ADHD 04/12/2019  . Multiple sclerosis (HCC) 04/10/2019  . Tobacco abuse 04/10/2019    [facility] ,MS, CCC-SLP  05/23/2019, 5:36 PM  [facility] [address] [place], [place], [address] Phone: [phone]   Fax:  [phone]   Name: Jean Carlson MRN: [id number] Date of Birth: 02-Oct-2000

## 2019-05-24 ENCOUNTER — Ambulatory Visit: Payer: BC Managed Care – PPO | Admitting: Occupational Therapy

## 2019-05-24 ENCOUNTER — Other Ambulatory Visit: Payer: Self-pay

## 2019-05-24 ENCOUNTER — Ambulatory Visit: Payer: BC Managed Care – PPO

## 2019-05-24 ENCOUNTER — Encounter: Payer: Self-pay | Admitting: Occupational Therapy

## 2019-05-24 DIAGNOSIS — R41844 Frontal lobe and executive function deficit: Secondary | ICD-10-CM | POA: Diagnosis not present

## 2019-05-24 DIAGNOSIS — R2689 Other abnormalities of gait and mobility: Secondary | ICD-10-CM | POA: Diagnosis not present

## 2019-05-24 DIAGNOSIS — M6281 Muscle weakness (generalized): Secondary | ICD-10-CM | POA: Diagnosis not present

## 2019-05-24 DIAGNOSIS — R471 Dysarthria and anarthria: Secondary | ICD-10-CM

## 2019-05-24 DIAGNOSIS — R29818 Other symptoms and signs involving the nervous system: Secondary | ICD-10-CM

## 2019-05-24 DIAGNOSIS — R2681 Unsteadiness on feet: Secondary | ICD-10-CM

## 2019-05-24 DIAGNOSIS — R41840 Attention and concentration deficit: Secondary | ICD-10-CM

## 2019-05-24 DIAGNOSIS — R278 Other lack of coordination: Secondary | ICD-10-CM

## 2019-05-24 DIAGNOSIS — R296 Repeated falls: Secondary | ICD-10-CM | POA: Diagnosis not present

## 2019-05-24 NOTE — Patient Instructions (Addendum)
Keep using your slower and bigger speech when you feel yourself get too fast! You are doing a great job in the sessions.

## 2019-05-24 NOTE — Therapy (Signed)
[facility] [address] [place], [place], [address] Phone: [phone]   Fax:  [phone]  Occupational Therapy Treatment  Patient Details  Name: Jean Carlson MRN: [id number] Date of Birth: 2000-06-22 Referring Provider (OT): Dr. Dagoberto Ligas   Encounter Date: 05/24/2019  OT End of Session - 05/24/19 0902    Visit Number  4    Number of Visits  17    Date for OT Re-Evaluation  07/01/19    Authorization Type  BCBS    OT Start Time  [phone]    OT Stop Time  0930    OT Time Calculation (min)  38 min    Activity Tolerance  Patient tolerated treatment well    Behavior During Therapy  [facility] for tasks assessed/performed       Past Medical History:  Diagnosis Date  . ADHD     Past Surgical History:  Procedure Laterality Date  . WISDOM TOOTH EXTRACTION     x4    There were no vitals filed for this visit.  Subjective Assessment - 05/24/19 0855    Subjective   Pt reports that she is working on the putty at home, has slow pitch softball practice this afternoon    Pertinent History  demylenating disease/ MS, ADHD, Depression, tobacco use    Patient Stated Goals  improve left hand coordination    Currently in Pain?  No/denies       LUE Functional reaching to place/remove clothespins with 1-8lb resistance on vertical pole with min cueing for L shoulder hike.   Placing grooved pegs in pegboard for incr in-hand manipulation.    Placing O'connor pegs in pegboard with tweezers with min difficulty/min cueing initially.  Removing using in-hand manipulation with min difficulty.  Picking up blocks using gripper set on level 2 for sustained grip strength to pick up and stack with min-mod difficulty (initially attempted on level 3, but too difficult today).       OT Short Term Goals - 05/23/19 1004      OT SHORT TERM GOAL #1   Title  I with HEP for coordination and UE strength-06/01/19    Time  4    Period  Weeks    Status   On-going    Target Date  06/01/19      OT SHORT TERM GOAL #2   Title  Pt will verbalize understanding of energy conservation techniques    Time  4    Period  Weeks    Status  On-going      OT SHORT TERM GOAL #3   Title  Pt will verbalize understanding of compensatory strategies for short term memory and cognitive deficits.    Time  4    Period  Weeks    Status  On-going      OT SHORT TERM GOAL #4   Title  Pt will verbalize understanding of benefits of a routine/ daily schedule and utilize modified independently    Time  4    Period  Weeks    Status  On-going      OT SHORT TERM GOAL #5   Title  Pt will increase LUE grip strength by 5 lbs for improved LUE functional use during ADLs.    Baseline  RUE 76.2, LUE 56.2    Time  4    Period  Weeks    Status  On-going        OT Long Term Goals - 05/19/19 7989  OT LONG TERM GOAL #1   Title  Pt will demonstrate ability to perform a cognitive task and physical task simultaneously with 90% or better accuracy in prep for driving.    Time  8    Period  Weeks    Status  On-going      OT LONG TERM GOAL #2   Title  Pt will demonstrate improved fine motor coordination for ADLS as evidenced by decreasing 9 hole peg test score to 35 secs or less    Baseline  RUE 27.85, LUE 39.19    Time  8    Period  Weeks    Status  On-going      OT LONG TERM GOAL #3   Title  Pt will demonstrate improved LUE functional use for ADLs as evidenced by increasing box/ blocks score to 48 blocks or greater.    Baseline  RUE 53, LUE 43    Time  8    Period  Weeks    Status  On-going      OT LONG TERM GOAL #4   Title  Pt will verbalize understanding of MS related resources    Time  8    Period  Weeks    Status  On-going      OT LONG TERM GOAL #5   Title  Pt will perform simple stove top cooking task modified I demonstrating good safety awareness.    Time  4    Period  Weeks    Status  On-going            Plan - 05/24/19 0902     Clinical Impression Statement  Pt is progressing towards goals with improving strength and coordination.    OT Occupational Profile and History  Detailed Assessment- Review of Records and additional review of physical, cognitive, psychosocial history related to current functional performance    Occupational performance deficits (Please refer to evaluation for details):  ADL's;IADL's;Leisure;Social Participation;Education    Body Structure / Function / Physical Skills  ADL;Strength;Mobility;Balance;UE functional use;FMC;Coordination;Gait;GMC;Decreased knowledge of precautions;Decreased knowledge of use of DME;Dexterity;IADL;Sensation    Cognitive Skills  Attention;Energy/Drive;Memory;Safety Awareness;Sequencing;Thought    Rehab Potential  Good    Clinical Decision Making  Limited treatment options, no task modification necessary    Comorbidities Affecting Occupational Performance:  May have comorbidities impacting occupational performance    Modification or Assistance to Complete Evaluation   No modification of tasks or assist necessary to complete eval    OT Frequency  2x / week    OT Duration  8 weeks    OT Treatment/Interventions  Self-care/ADL training;Therapeutic exercise;Functional Mobility Training;Balance training;Manual Therapy;Neuromuscular education;Aquatic Therapy;Energy conservation;Therapeutic activities;Cognitive remediation/compensation;Cryotherapy;Paraffin;Visual/perceptual remediation/compensation;Fluidtherapy;Moist Heat;Passive range of motion;Patient/family education    Plan  continue to work towards goals, coordination, grip strength, proximal strength HEP? (quadruped/theraband)    Consulted and Agree with Plan of Care  Patient;Family member/caregiver    Family Member Consulted  mother       Patient will benefit from skilled therapeutic intervention in order to improve the following deficits and impairments:   Body Structure / Function / Physical Skills: ADL, Strength,  Mobility, Balance, UE functional use, FMC, Coordination, Gait, GMC, Decreased knowledge of precautions, Decreased knowledge of use of DME, Dexterity, IADL, Sensation Cognitive Skills: Attention, Energy/Drive, Memory, Safety Awareness, Sequencing, Thought     Visit Diagnosis: Other lack of coordination  Muscle weakness (generalized)  Other abnormalities of gait and mobility  Attention and concentration deficit  Frontal lobe and executive function deficit  Other symptoms and signs involving the nervous system  Unsteadiness on feet    Problem List Patient Active Problem List   Diagnosis Date Noted  . High risk medication use 05/03/2019  . Vitamin D deficiency 05/03/2019  . Depression with anxiety 05/03/2019  . Class 1 obesity due to excess calories without serious comorbidity with body mass index (BMI) of 31.0 to 31.9 in adult   . Neurogenic bowel   . Hypokalemia   . Neurogenic bladder 04/18/2019  . Demyelinating disorder (HCC) 04/12/2019  . ADHD 04/12/2019  . Multiple sclerosis (HCC) 04/10/2019  . Tobacco abuse 04/10/2019    [facility] 05/24/2019, 9:05 AM  [facility] [address] [place], [place], [address] Phone: [phone]   Fax:  [phone]  Name: LIZZY HAMRE MRN: [id number] Date of Birth: Jan 14, 2001   Willa Frater, OTR/L [facility] [address] [place], [place]  [address] [phone] phone [phone] 05/24/19 9:05 AM

## 2019-05-24 NOTE — Therapy (Signed)
[facility] [address] [place], [place], [address] Phone: [phone]   Fax:  [phone]  Speech Language Pathology Treatment  Patient Details  Name: Jean Carlson MRN: [id number] Date of Birth: [birth year] Referring Provider (SLP): Anguilli, D. (referral); Lucile Shutters, [place] (documentation)   Encounter Date: 05/24/2019  End of Session - 05/24/19 1419    Visit Number  4    Number of Visits  13    Date for SLP Re-Evaluation  07/07/19    SLP Start Time  0804    SLP Stop Time   0845    SLP Time Calculation (min)  41 min    Activity Tolerance  Patient tolerated treatment well       Past Medical History:  Diagnosis Date  . ADHD     Past Surgical History:  Procedure Laterality Date  . WISDOM TOOTH EXTRACTION     x4    There were no vitals filed for this visit.  Subjective Assessment - 05/24/19 0811    Subjective  "My boyfriend started to vape again and the little weasel thought he wouldn't tell me."    Currently in Pain?  No/denies            ADULT SLP TREATMENT - 05/24/19 0814      General Information   Behavior/Cognition  Alert;Cooperative;Pleasant mood;Distractible      Treatment Provided   Treatment provided  Cognitive-Linquistic      Cognitive-Linquistic Treatment   Treatment focused on  Dysarthria    Skilled Treatment  Pt started with pt in conversation for 10 minutes with 100% intelligibility and use of speech strategies, with rare reminders from SLP for use of strategies; pt self corrected when she rarely experienced less-intelligible speech. When pt was excited about the topic pt still ~95-100% intelligible. SLP focused pt on speech compensation strategies in reading, adn highlighted the need to use slow rate and overarticulation especially when excited and/or tired. Pt read longer article today to habitualize speech strategies - 80% success. Pt agreed that reducing ST frequency to every  week was a good idea given her progress.       Assessment / Recommendations / Plan   Plan  Continue with current plan of care;Other (Comment)   reduce frequency to once/week     Progression Toward Goals   Progression toward goals  Progressing toward goals         SLP Short Term Goals - 05/24/19 1420      SLP SHORT TERM GOAL #1   Title  pt will demo speech intelligibilty strategies in 10 minutes simple conversation x2 sessions    Baseline  05-23-19    Status  Achieved      SLP SHORT TERM GOAL #2   Title  pt will complete speech quality of life measure    Status  Achieved       SLP Long Term Goals - 05/24/19 1421      SLP LONG TERM GOAL #1   Title  pt will use compensatory strategies for 95% speech intelligibility in 15 minutes mod complex conversation x3 sessions    Baseline  05-24-19    Time  5    Period  Weeks   or 13 total sessions, for all LTGs   Status  On-going      SLP LONG TERM GOAL #2   Title  pt will report a higher QOL score than in first session    Time  5  Period  Weeks    Status  On-going      SLP LONG TERM GOAL #3   Title  pt will report using at least one functional strategy for stamina management re: voice/speech intelligibility between 3 sessions    Time  5    Period  Weeks    Status  On-going       Plan - 05/24/19 1419    Clinical Impression Statement  Pt presents with WNL speech today; pt reports slightly worse with excitment, anger and fatigue. Pt agreed that a decr to once/week was a good idea. Pt with premorbid dx of ADHD;at baseline per pt and mother - SLP observed signs of this today in session. Pt my cont to benefit from skilled ST targeting her intermittent dysarthria. Discharge likely in 1-3 sessions    Speech Therapy Frequency  2x / week   6 weeks   Duration  --   6 weeks, or 13 total visits   Treatment/Interventions  Functional tasks;Compensatory techniques;SLP instruction and feedback;Patient/family education;Internal/external  aids;Multimodal communcation approach    Potential to Achieve Goals  Good       Patient will benefit from skilled therapeutic intervention in order to improve the following deficits and impairments:   Dysarthria and anarthria    Problem List Patient Active Problem List   Diagnosis Date Noted  . High risk medication use 05/03/2019  . Vitamin D deficiency 05/03/2019  . Depression with anxiety 05/03/2019  . Class 1 obesity due to excess calories without serious comorbidity with body mass index (BMI) of 31.0 to 31.9 in adult   . Neurogenic bowel   . Hypokalemia   . Neurogenic bladder 04/18/2019  . Demyelinating disorder ([place]) 04/12/2019  . ADHD 04/12/2019  . Multiple sclerosis ([place]) 04/10/2019  . Tobacco abuse 04/10/2019    [facility] ,MS, CCC-SLP  05/24/2019, 2:22 PM  [facility] [address] [place], [place], [address] Phone: [phone]   Fax:  [phone]   Name: Jean Carlson MRN: [id number] Date of Birth: [birth year]

## 2019-05-26 ENCOUNTER — Other Ambulatory Visit: Payer: Self-pay

## 2019-05-26 ENCOUNTER — Ambulatory Visit: Payer: BC Managed Care – PPO | Admitting: Physical Therapy

## 2019-05-26 ENCOUNTER — Encounter: Payer: Self-pay | Admitting: Physical Therapy

## 2019-05-26 DIAGNOSIS — R2689 Other abnormalities of gait and mobility: Secondary | ICD-10-CM

## 2019-05-26 DIAGNOSIS — R41844 Frontal lobe and executive function deficit: Secondary | ICD-10-CM | POA: Diagnosis not present

## 2019-05-26 DIAGNOSIS — M6281 Muscle weakness (generalized): Secondary | ICD-10-CM

## 2019-05-26 DIAGNOSIS — R278 Other lack of coordination: Secondary | ICD-10-CM | POA: Diagnosis not present

## 2019-05-26 DIAGNOSIS — R296 Repeated falls: Secondary | ICD-10-CM | POA: Diagnosis not present

## 2019-05-26 DIAGNOSIS — R2681 Unsteadiness on feet: Secondary | ICD-10-CM

## 2019-05-26 DIAGNOSIS — R41840 Attention and concentration deficit: Secondary | ICD-10-CM | POA: Diagnosis not present

## 2019-05-26 DIAGNOSIS — R29818 Other symptoms and signs involving the nervous system: Secondary | ICD-10-CM | POA: Diagnosis not present

## 2019-05-26 DIAGNOSIS — R471 Dysarthria and anarthria: Secondary | ICD-10-CM | POA: Diagnosis not present

## 2019-05-26 NOTE — Therapy (Signed)
[facility] [address] [place], [place], [address] Phone: [phone]   Fax:  [phone]  Physical Therapy Treatment  Patient Details  Name: Jean Carlson MRN: [id number] Date of Birth: July 25, 2000 Referring Provider (PT): Cathlyn Parsons, PA-C   Encounter Date: 05/26/2019  PT End of Session - 05/26/19 1019    Visit Number  9    Number of Visits  17    Date for PT Re-Evaluation  06/26/19    Authorization Type  BCBS    PT Start Time  [phone]    PT Stop Time  1019    PT Time Calculation (min)  41 min    Activity Tolerance  Patient tolerated treatment well    Behavior During Therapy  [facility] for tasks assessed/performed       Past Medical History:  Diagnosis Date  . ADHD     Past Surgical History:  Procedure Laterality Date  . WISDOM TOOTH EXTRACTION     x4    There were no vitals filed for this visit.  Subjective Assessment - 05/26/19 0945    Subjective  Has a softball game tonight. Notices that the morning after a game her gastroc muscles are extremely tense and first few steps are very painful.  Hast to stretch first thing in the morning.    Pertinent History  ADHD    Patient Stated Goals  Play softball, play with the dog    Currently in Pain?  No/denies         [facility] PT Assessment - 05/26/19 0948      Berg Balance Test   Sit to Stand  Able to stand without using hands and stabilize independently    Standing Unsupported  Able to stand safely 2 minutes    Sitting with Back Unsupported but Feet Supported on Floor or Stool  Able to sit safely and securely 2 minutes    Stand to Sit  Sits safely with minimal use of hands    Transfers  Able to transfer safely, minor use of hands    Standing Unsupported with Eyes Closed  Able to stand 10 seconds safely    Standing Unsupported with Feet Together  Able to place feet together independently and stand 1 minute safely    From Standing, Reach Forward with  Outstretched Arm  Can reach confidently >25 cm (10")    From Standing Position, Pick up Object from Floor  Able to pick up shoe safely and easily    From Standing Position, Turn to Look Behind Over each Shoulder  Looks behind from both sides and weight shifts well    Turn 360 Degrees  Able to turn 360 degrees safely in 4 seconds or less    Standing Unsupported, Alternately Place Feet on Step/Stool  Able to stand independently and safely and complete 8 steps in 20 seconds    Standing Unsupported, One Foot in Front  Able to place foot tandem independently and hold 30 seconds    Standing on One Leg  Able to lift leg independently and hold > 10 seconds    Total Score  56    Berg comment:  56/56      Functional Gait  Assessment   Gait assessed   Yes    Gait Level Surface  Walks 20 ft in less than 5.5 sec, no assistive devices, good speed, no evidence for imbalance, normal gait pattern, deviates no more than 6 in outside of the 12 in  walkway width.    Change in Gait Speed  Able to smoothly change walking speed without loss of balance or gait deviation. Deviate no more than 6 in outside of the 12 in walkway width.    Gait with Horizontal Head Turns  Performs head turns smoothly with no change in gait. Deviates no more than 6 in outside 12 in walkway width    Gait with Vertical Head Turns  Performs head turns with no change in gait. Deviates no more than 6 in outside 12 in walkway width.    Gait and Pivot Turn  Pivot turns safely within 3 sec and stops quickly with no loss of balance.    Step Over Obstacle  Is able to step over 2 stacked shoe boxes taped together (9 in total height) without changing gait speed. No evidence of imbalance.    Gait with Narrow Base of Support  Is able to ambulate for 10 steps heel to toe with no staggering.    Gait with Eyes Closed  Walks 20 ft, no assistive devices, good speed, no evidence of imbalance, normal gait pattern, deviates no more than 6 in outside 12 in walkway  width. Ambulates 20 ft in less than 7 sec.    Ambulating Backwards  Walks 20 ft, no assistive devices, good speed, no evidence for imbalance, normal gait    Steps  Alternating feet, no rail.    Total Score  30    FGA comment:  30/30         Vestibular Assessment - 05/26/19 0949      Symptom Behavior   Subjective history of current problem  Reports when playing softball in outfield, running and trying to keep eys on the ball - reports object bouncing    Type of Dizziness   Oscillopsia    Frequency of Dizziness  intermittent      Oculomotor Exam   Oculomotor Alignment  Abnormal   L eye elevated   Ocular ROM  WFL    Spontaneous  Absent    Gaze-induced   Direction changing nystagmus    Smooth Pursuits  Saccades    Saccades  Comment   R eye delayed, adducts spontaneously   Comment  Cover test negative; Uncover test and alternating cover test: + for exophoria and +test of skew.  Convergence and divergence intact      Oculomotor Exam-Fixation Suppressed    Left Head Impulse  positive    Right Head Impulse  positive      Vestibulo-Ocular Reflex   VOR to Slow Head Movement  Normal    VOR Cancellation  Normal      Visual Acuity   Static  10    Dynamic  4   6 line difference                      PT Education - 05/26/19 1521    Education Details  how eye alignment and vestibular dysfunction affect her ability to stabilize gaze on softball and cause the "bouncing" of her eyes when running; progress towards goals    Person(s) Educated  Patient    Methods  Explanation    Comprehension  Verbalized understanding       PT Short Term Goals - 05/26/19 0947      PT SHORT TERM GOAL #1   Title  Pt will initiate and demonstrate compliance with land based HEP.    Time  4    Period  Weeks  Status  Achieved    Target Date  05/27/19      PT SHORT TERM GOAL #2   Title  Pt will improve BERG balance to 56/56    Baseline  --    Time  4    Period  Weeks    Status   Achieved    Target Date  05/27/19      PT SHORT TERM GOAL #3   Title  Pt will improve FGA to 28/30    Baseline  --    Time  4    Period  Weeks    Status  Achieved    Target Date  05/27/19        PT Long Term Goals - 05/26/19 1522      PT LONG TERM GOAL #1   Title  Pt will demonstrate compliance with land and aquatic HEP  (06/26/19 date for all LTG)    Time  8    Period  Weeks    Status  New      PT LONG TERM GOAL #2   Title  Pt will improve FGA to 30/30 to indicate low falls risk    Time  8    Period  Weeks    Status  Achieved      PT LONG TERM GOAL #3   Title  Pt will demonstrate ability to transfer safely floor <> stand MOD I multiple times    Time  8    Period  Weeks    Status  New      PT LONG TERM GOAL #4   Title  Pt will demonstrate ability to catch/throw softball and jog in grass x 200' without LOB    Time  8    Period  Weeks    Status  New      PT LONG TERM GOAL #5   Title  Pt will demonstrate improved use of VOR as indicated by decrease in DVA to 3-4 line difference    Baseline  6 line difference    Time  8    Period  Weeks    Status  New            Plan - 05/26/19 1020    Clinical Impression Statement  Performed assessment of progress towards STG.  Pt has met all STG and demonstrates lower falls risk as indicated by BERG 56/56 and FGA 30/30.  Pt continues to demonstrate mild balance impairments on compliant surfaces, oculomotor and vestibular impairments that result in oscillopsia with impaired gaze stability with faster head and body movements.  LTG updated based on progress.  Will continue to address and progress towards LTG.    Personal Factors and Comorbidities  Comorbidity 1    Comorbidities  ADHD    Examination-Activity Limitations  Locomotion Level;Stairs    Examination-Participation Restrictions  Community Activity;Driving    Stability/Clinical Decision Making  Stable/Uncomplicated    Rehab Potential  Excellent    PT Frequency  2x / week     PT Duration  8 weeks    PT Treatment/Interventions  ADLs/Self Care Home Management;Aquatic Therapy;Electrical Stimulation;Gait training;Stair training;Functional mobility training;Therapeutic activities;Therapeutic exercise;Balance training;Neuromuscular re-education;Patient/family education;Orthotic Fit/Training    PT Next Visit Plan  Balance on compliant surfaces and more plyometric/dynamic movement - jump, jog, agility ladder.  VOR training at fast head speads.  Softball outside on grass.  Trial Bioness for strengthening.    Consulted and Agree with Plan of Care  Patient  Patient will benefit from skilled therapeutic intervention in order to improve the following deficits and impairments:  Abnormal gait, Decreased balance, Decreased coordination, Decreased strength, Difficulty walking, Impaired tone  Visit Diagnosis: Muscle weakness (generalized)  Other lack of coordination  Other abnormalities of gait and mobility  Unsteadiness on feet  Repeated falls     Problem List Patient Active Problem List   Diagnosis Date Noted  . High risk medication use 05/03/2019  . Vitamin D deficiency 05/03/2019  . Depression with anxiety 05/03/2019  . Class 1 obesity due to excess calories without serious comorbidity with body mass index (BMI) of 31.0 to 31.9 in adult   . Neurogenic bowel   . Hypokalemia   . Neurogenic bladder 04/18/2019  . Demyelinating disorder ([place]) 04/12/2019  . ADHD 04/12/2019  . Multiple sclerosis ([place]) 04/10/2019  . Tobacco abuse 04/10/2019    Rico Junker, PT, DPT 05/26/19    3:27 PM    [facility] [address] [place], [place], [address] Phone: [phone]   Fax:  [phone]  Name: Jean Carlson MRN: [id number] Date of Birth: [birth year]

## 2019-05-29 ENCOUNTER — Other Ambulatory Visit: Payer: Self-pay

## 2019-05-29 ENCOUNTER — Ambulatory Visit: Payer: BC Managed Care – PPO | Attending: Physician Assistant | Admitting: Physical Therapy

## 2019-05-29 DIAGNOSIS — R29818 Other symptoms and signs involving the nervous system: Secondary | ICD-10-CM | POA: Insufficient documentation

## 2019-05-29 DIAGNOSIS — R2681 Unsteadiness on feet: Secondary | ICD-10-CM | POA: Insufficient documentation

## 2019-05-29 DIAGNOSIS — R41840 Attention and concentration deficit: Secondary | ICD-10-CM | POA: Diagnosis not present

## 2019-05-29 DIAGNOSIS — R41844 Frontal lobe and executive function deficit: Secondary | ICD-10-CM | POA: Insufficient documentation

## 2019-05-29 DIAGNOSIS — R278 Other lack of coordination: Secondary | ICD-10-CM | POA: Diagnosis not present

## 2019-05-29 DIAGNOSIS — R296 Repeated falls: Secondary | ICD-10-CM | POA: Diagnosis not present

## 2019-05-29 DIAGNOSIS — M6281 Muscle weakness (generalized): Secondary | ICD-10-CM | POA: Insufficient documentation

## 2019-05-29 DIAGNOSIS — R2689 Other abnormalities of gait and mobility: Secondary | ICD-10-CM | POA: Diagnosis not present

## 2019-05-29 DIAGNOSIS — R471 Dysarthria and anarthria: Secondary | ICD-10-CM | POA: Insufficient documentation

## 2019-05-30 NOTE — Therapy (Signed)
[facility] [address] [place], [place], [address] Phone: [phone]   Fax:  [phone]  Physical Therapy Treatment  Patient Details  Name: Jean Carlson MRN: [id number] Date of Birth: [birth year] Referring Provider (PT): Charlton Amor, PA-C   Encounter Date: 05/29/2019  PT End of Session - 05/30/19 1805    Visit Number  10    Number of Visits  17    Date for PT Re-Evaluation  06/26/19    Authorization Type  BCBS    PT Start Time  1438   pt arrives 20" late for appt   PT Stop Time  1503    PT Time Calculation (min)  25 min    Equipment Utilized During Treatment  Other (comment)   aquatic cuffs   Activity Tolerance  Patient tolerated treatment well    Behavior During Therapy  [facility] for tasks assessed/performed       Past Medical History:  Diagnosis Date  . ADHD     Past Surgical History:  Procedure Laterality Date  . WISDOM TOOTH EXTRACTION     x4    There were no vitals filed for this visit.  Subjective Assessment - 05/30/19 1804    Subjective  Pt arrives 20" late for aquatic therapy appt -states she is running late due to the rain and the traffic    Patient is accompained by:  --   friend   Pertinent History  ADHD    Patient Stated Goals  Play softball, play with the dog    Currently in Pain?  No/denies         Aquatic therapy at [facility] - pool temp. 87.4 degrees   Patient seen for aquatic therapy today.  Treatment took place in water 3.5-4 feet deep depending upon activity.  Pt entered the pool via  ramp negotiation with use of hand rails with supervision.  Pt performed bil. Hamstring and heel cord stretch - runner's stretch 30 sec hold x 1 rep; heel cord stretch with toes/forefoot on pool wall 30 sec hold x 1 rep  Pt performed water walking forwards, backwards and sideways 2 reps approx. 41' across pool with cues for reciprocal arm movement;  Horizontal head turns performed with forward  ambulation in pool  Sideways stepping with squats 50' x 2 reps with use of bar bells for incr. Resistance for strengthening and for improved balance  Pt performed jogging 50' x 2 reps across pool with cues for reciprocal arm movement  Pt performed Ai Chi postures - soothing and balancing 10 reps, 20 reps respectively                       PT Short Term Goals - 05/30/19 1812      PT SHORT TERM GOAL #1   Title  Pt will initiate and demonstrate compliance with land based HEP.    Time  4    Period  Weeks    Status  Achieved    Target Date  05/27/19      PT SHORT TERM GOAL #2   Title  Pt will improve BERG balance to 56/56    Time  4    Period  Weeks    Status  Achieved    Target Date  05/27/19      PT SHORT TERM GOAL #3   Title  Pt will improve FGA to 28/30    Time  4    Period  Weeks    Status  Achieved    Target Date  05/27/19        PT Long Term Goals - 05/30/19 1812      PT LONG TERM GOAL #1   Title  Pt will demonstrate compliance with land and aquatic HEP  (06/26/19 date for all LTG)    Time  8    Period  Weeks    Status  New      PT LONG TERM GOAL #2   Title  Pt will improve FGA to 30/30 to indicate low falls risk    Time  8    Period  Weeks    Status  Achieved      PT LONG TERM GOAL #3   Title  Pt will demonstrate ability to transfer safely floor <> stand MOD I multiple times    Time  8    Period  Weeks    Status  New      PT LONG TERM GOAL #4   Title  Pt will demonstrate ability to catch/throw softball and jog in grass x 200' without LOB    Time  8    Period  Weeks    Status  New      PT LONG TERM GOAL #5   Title  Pt will demonstrate improved use of VOR as indicated by decrease in DVA to 3-4 line difference    Baseline  6 line difference    Time  8    Period  Weeks    Status  New            Plan - 05/30/19 1808    Clinical Impression Statement  Aquatic therapy session focused on strengthening and visual/vestibular  exercises in the water with head turns incorporated with water walking.  Pt states she is able to look up but gets an electrical shock sensation if she flexes her neck excessively.  Pt reported tightness in calf muscles doing to having played softball Friday night and then participating in softball practice on Sunday pm.    Personal Factors and Comorbidities  Comorbidity 1    Comorbidities  ADHD    Examination-Activity Limitations  Locomotion Level;Stairs    Examination-Participation Restrictions  Community Activity;Driving    Stability/Clinical Decision Making  Stable/Uncomplicated    Rehab Potential  Excellent    PT Frequency  2x / week    PT Duration  8 weeks    PT Treatment/Interventions  ADLs/Self Care Home Management;Aquatic Therapy;Electrical Stimulation;Gait training;Stair training;Functional mobility training;Therapeutic activities;Therapeutic exercise;Balance training;Neuromuscular re-education;Patient/family education;Orthotic Fit/Training    PT Next Visit Plan  Balance on compliant surfaces and more plyometric/dynamic movement - jump, jog, agility ladder.  VOR training at fast head speads.  Softball outside on grass.  Trial Bioness for strengthening.    Consulted and Agree with Plan of Care  Patient       Patient will benefit from skilled therapeutic intervention in order to improve the following deficits and impairments:  Abnormal gait, Decreased balance, Decreased coordination, Decreased strength, Difficulty walking, Impaired tone  Visit Diagnosis: Muscle weakness (generalized)  Other abnormalities of gait and mobility  Other symptoms and signs involving the nervous system     Problem List Patient Active Problem List   Diagnosis Date Noted  . High risk medication use 05/03/2019  . Vitamin D deficiency 05/03/2019  . Depression with anxiety 05/03/2019  . Class 1 obesity due to excess calories without serious comorbidity with body mass index (BMI) of 31.0  to 31.9 in adult    . Neurogenic bowel   . Hypokalemia   . Neurogenic bladder 04/18/2019  . Demyelinating disorder (HCC) 04/12/2019  . ADHD 04/12/2019  . Multiple sclerosis (HCC) 04/10/2019  . Tobacco abuse 04/10/2019    Tremaine Fuhriman, Donavan Burnet, PT, ATRIC 05/30/2019, 6:14 PM  [facility] [address] [place], [place], [address] Phone: [phone]   Fax:  [phone]  Name: Jean Carlson MRN: [id number] Date of Birth: Jul 10, 2000

## 2019-05-31 ENCOUNTER — Encounter: Payer: Self-pay | Admitting: Occupational Therapy

## 2019-05-31 ENCOUNTER — Ambulatory Visit: Payer: BC Managed Care – PPO | Admitting: Physical Therapy

## 2019-05-31 ENCOUNTER — Ambulatory Visit: Payer: BC Managed Care – PPO | Admitting: Occupational Therapy

## 2019-05-31 ENCOUNTER — Encounter: Payer: Self-pay | Admitting: Physical Therapy

## 2019-05-31 ENCOUNTER — Other Ambulatory Visit: Payer: Self-pay

## 2019-05-31 ENCOUNTER — Ambulatory Visit: Payer: BC Managed Care – PPO

## 2019-05-31 DIAGNOSIS — R471 Dysarthria and anarthria: Secondary | ICD-10-CM

## 2019-05-31 DIAGNOSIS — M6281 Muscle weakness (generalized): Secondary | ICD-10-CM

## 2019-05-31 DIAGNOSIS — R2681 Unsteadiness on feet: Secondary | ICD-10-CM | POA: Diagnosis not present

## 2019-05-31 DIAGNOSIS — R296 Repeated falls: Secondary | ICD-10-CM

## 2019-05-31 DIAGNOSIS — R29818 Other symptoms and signs involving the nervous system: Secondary | ICD-10-CM

## 2019-05-31 DIAGNOSIS — R278 Other lack of coordination: Secondary | ICD-10-CM

## 2019-05-31 DIAGNOSIS — R2689 Other abnormalities of gait and mobility: Secondary | ICD-10-CM

## 2019-05-31 DIAGNOSIS — R41844 Frontal lobe and executive function deficit: Secondary | ICD-10-CM | POA: Diagnosis not present

## 2019-05-31 DIAGNOSIS — R41840 Attention and concentration deficit: Secondary | ICD-10-CM | POA: Diagnosis not present

## 2019-05-31 NOTE — Therapy (Signed)
[facility] [address] [place], [place], [address] Phone: [phone]   Fax:  [phone]  Speech Language Pathology Treatment  Patient Details  Name: Jean Carlson MRN: [id number] Date of Birth: Jun 05, 2000 Referring Provider (SLP): Anguilli, D. (referral); Lucile Shutters, [place] (documentation)   Encounter Date: 05/31/2019  End of Session - 05/31/19 1445    Visit Number  5    Number of Visits  13    Date for SLP Re-Evaluation  07/07/19    SLP Start Time  1020    SLP Stop Time   1100    SLP Time Calculation (min)  40 min    Activity Tolerance  Patient tolerated treatment well       Past Medical History:  Diagnosis Date  . ADHD     Past Surgical History:  Procedure Laterality Date  . WISDOM TOOTH EXTRACTION     x4    There were no vitals filed for this visit.  Subjective Assessment - 05/31/19 1022    Subjective  "Last night I enunciated on my own!"    Currently in Pain?  No/denies            ADULT SLP TREATMENT - 05/31/19 1030      General Information   Behavior/Cognition  Alert;Cooperative;Pleasant mood;Distractible      Treatment Provided   Treatment provided  Cognitive-Linquistic      Cognitive-Linquistic Treatment   Treatment focused on  Dysarthria    Skilled Treatment  SLP educated pt/shared with pt about stamina management and pt stated she is doing a lot of this already with softball practice. Pt was engaged in conversation for 25 in and outside of the speech room and intelligibliity  100%. Likely d/c next visit.      Assessment / Recommendations / Plan   Plan  Other (Comment)   d/c in next 1-2 visits     Progression Toward Goals   Progression toward goals  Progressing toward goals         SLP Short Term Goals - 05/24/19 1420      SLP SHORT TERM GOAL #1   Title  pt will demo speech intelligibilty strategies in 10 minutes simple conversation x2 sessions    Baseline  05-23-19    Status  Achieved      SLP SHORT TERM GOAL #2   Title  pt will complete speech quality of life measure    Status  Achieved       SLP Long Term Goals - 05/31/19 1447      SLP LONG TERM GOAL #1   Title  pt will use compensatory strategies for 95% speech intelligibility in 15 minutes mod complex conversation x3 sessions    Baseline  05-24-19, 05-31-19    Time  4    Period  Weeks   or 13 total sessions, for all LTGs   Status  On-going      SLP LONG TERM GOAL #2   Title  pt will report a higher QOL score than in first session    Time  4    Period  Weeks    Status  On-going      SLP LONG TERM GOAL #3   Title  pt will report using at least one functional strategy for stamina management re: voice/speech intelligibility between 3 sessions    Baseline  05-31-19    Time  4    Period  Weeks    Status  On-going  Plan - 05/31/19 1446    Clinical Impression Statement  Pt presents with WNL speech today; pt reports slightly worse with excitment, anger and fatigue and reports she used overarticulation last night with her father, spontaneously. Pt agreed that d/c next session was feasible. Pt with premorbid dx of ADHD;at baseline per pt and mother - SLP observed signs of this today in session. Pt my cont to benefit from skilled ST targeting her intermittent dysarthria. Discharge likely in 1-3 sessions    Speech Therapy Frequency  1x /week   6 weeks   Duration  --   6 weeks, or 13 total visits   Treatment/Interventions  Functional tasks;Compensatory techniques;SLP instruction and feedback;Patient/family education;Internal/external aids;Multimodal communcation approach    Potential to Achieve Goals  Good       Patient will benefit from skilled therapeutic intervention in order to improve the following deficits and impairments:   Dysarthria and anarthria    Problem List Patient Active Problem List   Diagnosis Date Noted  . High risk medication use 05/03/2019  . Vitamin D deficiency  05/03/2019  . Depression with anxiety 05/03/2019  . Class 1 obesity due to excess calories without serious comorbidity with body mass index (BMI) of 31.0 to 31.9 in adult   . Neurogenic bowel   . Hypokalemia   . Neurogenic bladder 04/18/2019  . Demyelinating disorder ([place]) 04/12/2019  . ADHD 04/12/2019  . Multiple sclerosis ([place]) 04/10/2019  . Tobacco abuse 04/10/2019    [facility] ,MS, [place]  05/31/2019, 2:48 PM  [facility] [address] [place], [place], [address] Phone: [phone]   Fax:  [phone]   Name: Jean Carlson MRN: [id number] Date of Birth: [birth year]

## 2019-05-31 NOTE — Patient Instructions (Signed)

## 2019-05-31 NOTE — Therapy (Signed)
[facility] [address] [place], [place], [address] Phone: [phone]   Fax:  [phone]  Occupational Therapy Treatment  Patient Details  Name: Jean Carlson MRN: [id number] Date of Birth: 03/19/2000 Referring Provider (OT): Dr. Berline Chough   Encounter Date: 05/31/2019  OT End of Session - 05/31/19 1105    Visit Number  5    Number of Visits  17    Date for OT Re-Evaluation  07/01/19    Authorization Type  BCBS    OT Start Time  1104    OT Stop Time  1142    OT Time Calculation (min)  38 min    Activity Tolerance  Patient tolerated treatment well    Behavior During Therapy  [facility] for tasks assessed/performed       Past Medical History:  Diagnosis Date  . ADHD     Past Surgical History:  Procedure Laterality Date  . WISDOM TOOTH EXTRACTION     x4    There were no vitals filed for this visit.  Subjective Assessment - 05/31/19 1105    Subjective   Denies pain    Pertinent History  demylenating disease/ MS, ADHD, Depression, tobacco use    Patient Stated Goals  improve left hand coordination               Treatment; Therapist started checking progress towards goals. Placing grooved pegs into pegboard with LUE for increased fine motor coordination, min difficulty/ v.c Removing with in hand manipulation Arm bike x 6 mins level 3 for reciprocal movement Gripper set at level 2 to pick up 1 inch blocks for sustained grip            OT Education - 05/31/19 1132    Education Details  red theraband HEP    Person(s) Educated  Patient    Methods  Explanation;Demonstration;Verbal cues;Handout    Comprehension  Verbalized understanding;Returned demonstration;Verbal cues required       OT Short Term Goals - 05/31/19 1109      OT SHORT TERM GOAL #1   Title  I with HEP for coordination and UE strength-06/01/19    Time  4    Period  Weeks    Status  On-going   ongoing, met for coordination,  strengthening HEP just issued   Target Date  06/01/19      OT SHORT TERM GOAL #2   Title  Pt will verbalize understanding of energy conservation techniques    Time  4    Period  Weeks    Status  On-going      OT SHORT TERM GOAL #3   Title  Pt will verbalize understanding of compensatory strategies for short term memory and cognitive deficits.    Time  4    Period  Weeks    Status  Achieved      OT SHORT TERM GOAL #4   Title  Pt will verbalize understanding of benefits of a routine/ daily schedule and utilize modified independently    Time  4    Period  Weeks    Status  On-going   Pt reports she is still working on getting on a Doctor, hospital schedule     OT SHORT TERM GOAL #5   Title  Pt will increase LUE grip strength by 5 lbs for improved LUE functional use during ADLs.    Baseline  RUE 76.2, LUE 56.2    Time  4    Period  Weeks    Status  On-going   57.3 lbs       OT Long Term Goals - 05/19/19 0912      OT LONG TERM GOAL #1   Title  Pt will demonstrate ability to perform a cognitive task and physical task simultaneously with 90% or better accuracy in prep for driving.    Time  8    Period  Weeks    Status  On-going      OT LONG TERM GOAL #2   Title  Pt will demonstrate improved fine motor coordination for ADLS as evidenced by decreasing 9 hole peg test score to 35 secs or less    Baseline  RUE 27.85, LUE 39.19    Time  8    Period  Weeks    Status  On-going      OT LONG TERM GOAL #3   Title  Pt will demonstrate improved LUE functional use for ADLs as evidenced by increasing box/ blocks score to 48 blocks or greater.    Baseline  RUE 53, LUE 43    Time  8    Period  Weeks    Status  On-going      OT LONG TERM GOAL #4   Title  Pt will verbalize understanding of MS related resources    Time  8    Period  Weeks    Status  On-going      OT LONG TERM GOAL #5   Title  Pt will perform simple stove top cooking task modified I demonstrating good safety awareness.     Time  4    Period  Weeks    Status  On-going            Plan - 05/31/19 1143    Clinical Impression Statement  Pt is progressing towards goals with improving strength and coordination. She demonstrates understanding of red theraband HEP.    OT Occupational Profile and History  Detailed Assessment- Review of Records and additional review of physical, cognitive, psychosocial history related to current functional performance    Occupational performance deficits (Please refer to evaluation for details):  ADL's;IADL's;Leisure;Social Participation;Education    Body Structure / Function / Physical Skills  ADL;Strength;Mobility;Balance;UE functional use;FMC;Coordination;Gait;GMC;Decreased knowledge of precautions;Decreased knowledge of use of DME;Dexterity;IADL;Sensation    Cognitive Skills  Attention;Energy/Drive;Memory;Safety Awareness;Sequencing;Thought    Rehab Potential  Good    Clinical Decision Making  Limited treatment options, no task modification necessary    Comorbidities Affecting Occupational Performance:  May have comorbidities impacting occupational performance    Modification or Assistance to Complete Evaluation   No modification of tasks or assist necessary to complete eval    OT Frequency  2x / week    OT Duration  8 weeks    OT Treatment/Interventions  Self-care/ADL training;Therapeutic exercise;Functional Mobility Training;Balance training;Manual Therapy;Neuromuscular education;Aquatic Therapy;Energy conservation;Therapeutic activities;Cognitive remediation/compensation;Cryotherapy;Paraffin;Visual/perceptual remediation/compensation;Fluidtherapy;Moist Heat;Passive range of motion;Patient/family education    Plan  continue to work towards goals, coordination, grip strength, review red theraband HEP    Consulted and Agree with Plan of Care  Patient;Family member/caregiver    Family Member Consulted  mother       Patient will benefit from skilled therapeutic intervention in  order to improve the following deficits and impairments:   Body Structure / Function / Physical Skills: ADL, Strength, Mobility, Balance, UE functional use, FMC, Coordination, Gait, GMC, Decreased knowledge of precautions, Decreased knowledge of use of DME, Dexterity, IADL, Sensation Cognitive Skills: Attention, Energy/Drive, Memory, Safety Awareness,  Sequencing, Thought     Visit Diagnosis: Muscle weakness (generalized)  Other symptoms and signs involving the nervous system  Other lack of coordination    Problem List Patient Active Problem List   Diagnosis Date Noted  . High risk medication use 05/03/2019  . Vitamin D deficiency 05/03/2019  . Depression with anxiety 05/03/2019  . Class 1 obesity due to excess calories without serious comorbidity with body mass index (BMI) of 31.0 to 31.9 in adult   . Neurogenic bowel   . Hypokalemia   . Neurogenic bladder 04/18/2019  . Demyelinating disorder (HCC) 04/12/2019  . ADHD 04/12/2019  . Multiple sclerosis (HCC) 04/10/2019  . Tobacco abuse 04/10/2019    Jean Carlson 05/31/2019, 11:44 AM  [facility] [address] [place], [place], [address] Phone: [phone]   Fax:  [phone]  Name: Jean Carlson MRN: [id number] Date of Birth: 2000/08/05

## 2019-05-31 NOTE — Therapy (Signed)
[facility] [address] [place], [place], [address] Phone: [phone]   Fax:  [phone]  Physical Therapy Treatment  Patient Details  Name: Jean Carlson MRN: [id number] Date of Birth: Jun 07, 2000 Referring Provider (PT): Cathlyn Parsons, PA-C   Encounter Date: 05/31/2019  PT End of Session - 05/31/19 1229    Visit Number  11    Number of Visits  17    Date for PT Re-Evaluation  06/26/19    Authorization Type  BCBS    PT Start Time  0933    PT Stop Time  1020    PT Time Calculation (min)  47 min    Activity Tolerance  Patient tolerated treatment well    Behavior During Therapy  [facility] for tasks assessed/performed       Past Medical History:  Diagnosis Date  . ADHD     Past Surgical History:  Procedure Laterality Date  . WISDOM TOOTH EXTRACTION     x4    There were no vitals filed for this visit.  Subjective Assessment - 05/31/19 0936    Subjective  Pt very fatigued - just got up not long ago.  Has been very busy.    Patient is accompained by:  --   friend   Pertinent History  ADHD    Patient Stated Goals  Play softball, play with the dog    Currently in Pain?  No/denies                       [facility] Adult PT Treatment/Exercise - 05/31/19 0941      Knee/Hip Exercises: Aerobic   Nustep  Level 5 x 8 minutes at 71 RPM with LE only for general aerobic conditioning and endurance      Vestibular Treatment/Exercise - 05/31/19 0940      Vestibular Treatment/Exercise   Vestibular Treatment Provided  Gaze    Habituation Exercises  Standing Horizontal Head Turns;Standing Vertical Head Turns;Standing Diagonal Head Turns    Gaze Exercises  X1 Viewing Horizontal;X1 Viewing Vertical      Standing Horizontal Head Turns   Number of Reps   10    Symptom Description   standing with feet together on foam while performing head turns L and R with eyes and head following 4.4lb medicine ball L and R       Standing Vertical Head Turns   Number of Reps   10    Symptom Description   standing with feet together on foam while performing head nod up and down with eyes and head following 4.4lb medicine ball up and down       Standing Diagonal Head Turns   Number of Reps   10    Symptiom Description   standing on foam with feet together moving eyes and head together to follow 4.4 medicine ball moving in diagonal patterns L and R, 2 sets x 10 reps      X1 Viewing Horizontal   Foot Position  standing and then seated    Reps  3    Comments  60 seconds; not able to use metronome.  Mild symptoms in standing, no symptoms in sitting      X1 Viewing Vertical   Foot Position  standing and then seated due to inabilty to coordinate in standing    Reps  10    Comments  2 sets in sitting        Gaze Stabilization -  Tip Card  1.Target must remain in focus, not blurry, and appear stationary while head is in motion. 2.Perform exercises with small head movements (45 to either side of midline). 3.Increase speed of head motion so long as target is in focus. 4.If you wear eyeglasses, be sure you can see target through lens (therapist will give specific instructions for bifocal / progressive lenses). 5.These exercises may provoke dizziness or nausea. Work through these symptoms. If too dizzy, slow head movement slightly. Rest between each exercise. 6.Exercises demand concentration; avoid distractions. 7.For safety, perform standing exercises close to a counter, wall, corner, or next to someone.   Gaze Stabilization - Standing Feet Apart   Feet shoulder width apart, keeping eyes on target on wall 3 feet away, tilt head down slightly and move head side to side for 60 seconds. Do 2-3 sessions per day.   Gaze Stabilization: Sitting    Keeping eyes on target on wall 3 feet away, and move head up and down for 10 repetitions.  Perform 2 sets. Do __2-3__ sessions per day.           PT Education  - 05/31/19 1229    Education Details  initiated vestibular HEP    Person(s) Educated  Patient    Methods  Explanation;Demonstration;Handout    Comprehension  Verbalized understanding;Returned demonstration       PT Short Term Goals - 05/30/19 1812      PT SHORT TERM GOAL #1   Title  Pt will initiate and demonstrate compliance with land based HEP.    Time  4    Period  Weeks    Status  Achieved    Target Date  05/27/19      PT SHORT TERM GOAL #2   Title  Pt will improve BERG balance to 56/56    Time  4    Period  Weeks    Status  Achieved    Target Date  05/27/19      PT SHORT TERM GOAL #3   Title  Pt will improve FGA to 28/30    Time  4    Period  Weeks    Status  Achieved    Target Date  05/27/19        PT Long Term Goals - 05/30/19 1812      PT LONG TERM GOAL #1   Title  Pt will demonstrate compliance with land and aquatic HEP  (06/26/19 date for all LTG)    Time  8    Period  Weeks    Status  New      PT LONG TERM GOAL #2   Title  Pt will improve FGA to 30/30 to indicate low falls risk    Time  8    Period  Weeks    Status  Achieved      PT LONG TERM GOAL #3   Title  Pt will demonstrate ability to transfer safely floor <> stand MOD I multiple times    Time  8    Period  Weeks    Status  New      PT LONG TERM GOAL #4   Title  Pt will demonstrate ability to catch/throw softball and jog in grass x 200' without LOB    Time  8    Period  Weeks    Status  New      PT LONG TERM GOAL #5   Title  Pt will demonstrate improved use of VOR as  indicated by decrease in DVA to 3-4 line difference    Baseline  6 line difference    Time  8    Period  Weeks    Status  New            Plan - 05/31/19 1230    Clinical Impression Statement  Initiated session with endurance training and initiated vestibular training with x1 viewing - had to downgrade vertical VOR due to inability to coordinate and sequence.  Continued to focus on balance in combination with  visual-vestibular interactions on compliant surface - no dizziness but did experience gastroc mm spasms from standing on compliant surface.  Will continue to progress towards LTG.    Personal Factors and Comorbidities  Comorbidity 1    Comorbidities  ADHD    Examination-Activity Limitations  Locomotion Level;Stairs    Examination-Participation Restrictions  Community Activity;Driving    Stability/Clinical Decision Making  Stable/Uncomplicated    Rehab Potential  Excellent    PT Frequency  2x / week    PT Duration  8 weeks    PT Treatment/Interventions  ADLs/Self Care Home Management;Aquatic Therapy;Electrical Stimulation;Gait training;Stair training;Functional mobility training;Therapeutic activities;Therapeutic exercise;Balance training;Neuromuscular re-education;Patient/family education;Orthotic Fit/Training    PT Next Visit Plan  Balance on compliant surfaces and more plyometric/dynamic movement - jump, jog, agility ladder.  Progress VOR training and gaze stability on moving targets.  Softball outside on grass.  Trial Bioness for strengthening.    Consulted and Agree with Plan of Care  Patient       Patient will benefit from skilled therapeutic intervention in order to improve the following deficits and impairments:  Abnormal gait, Decreased balance, Decreased coordination, Decreased strength, Difficulty walking, Impaired tone  Visit Diagnosis: Muscle weakness (generalized)  Other abnormalities of gait and mobility  Other symptoms and signs involving the nervous system  Other lack of coordination  Unsteadiness on feet  Repeated falls     Problem List Patient Active Problem List   Diagnosis Date Noted  . High risk medication use 05/03/2019  . Vitamin D deficiency 05/03/2019  . Depression with anxiety 05/03/2019  . Class 1 obesity due to excess calories without serious comorbidity with body mass index (BMI) of 31.0 to 31.9 in adult   . Neurogenic bowel   . Hypokalemia   .  Neurogenic bladder 04/18/2019  . Demyelinating disorder (HCC) 04/12/2019  . ADHD 04/12/2019  . Multiple sclerosis (HCC) 04/10/2019  . Tobacco abuse 04/10/2019    Dierdre Highman, PT, DPT 05/31/19    12:34 PM    [facility] [address] [place], [place], [address] Phone: [phone]   Fax:  [phone]  Name: Jean Carlson MRN: [id number] Date of Birth: 04-01-2000

## 2019-05-31 NOTE — Patient Instructions (Signed)
Gaze Stabilization - Tip Card  1.Target must remain in focus, not blurry, and appear stationary while head is in motion. 2.Perform exercises with small head movements (45 to either side of midline). 3.Increase speed of head motion so long as target is in focus. 4.If you wear eyeglasses, be sure you can see target through lens (therapist will give specific instructions for bifocal / progressive lenses). 5.These exercises may provoke dizziness or nausea. Work through these symptoms. If too dizzy, slow head movement slightly. Rest between each exercise. 6.Exercises demand concentration; avoid distractions. 7.For safety, perform standing exercises close to a counter, wall, corner, or next to someone.   Gaze Stabilization - Standing Feet Apart   Feet shoulder width apart, keeping eyes on target on wall 3 feet away, tilt head down slightly and move head side to side for 60 seconds. Do 2-3 sessions per day.   Gaze Stabilization: Sitting    Keeping eyes on target on wall 3 feet away, and move head up and down for 10 repetitions.  Perform 2 sets. Do __2-3__ sessions per day.

## 2019-06-02 ENCOUNTER — Ambulatory Visit: Payer: BC Managed Care – PPO | Admitting: Occupational Therapy

## 2019-06-02 ENCOUNTER — Other Ambulatory Visit: Payer: Self-pay

## 2019-06-02 DIAGNOSIS — M6281 Muscle weakness (generalized): Secondary | ICD-10-CM

## 2019-06-02 DIAGNOSIS — R2689 Other abnormalities of gait and mobility: Secondary | ICD-10-CM | POA: Diagnosis not present

## 2019-06-02 DIAGNOSIS — R278 Other lack of coordination: Secondary | ICD-10-CM

## 2019-06-02 DIAGNOSIS — R29818 Other symptoms and signs involving the nervous system: Secondary | ICD-10-CM | POA: Diagnosis not present

## 2019-06-02 DIAGNOSIS — R471 Dysarthria and anarthria: Secondary | ICD-10-CM | POA: Diagnosis not present

## 2019-06-02 DIAGNOSIS — R296 Repeated falls: Secondary | ICD-10-CM | POA: Diagnosis not present

## 2019-06-02 DIAGNOSIS — R2681 Unsteadiness on feet: Secondary | ICD-10-CM | POA: Diagnosis not present

## 2019-06-02 DIAGNOSIS — R41844 Frontal lobe and executive function deficit: Secondary | ICD-10-CM | POA: Diagnosis not present

## 2019-06-02 DIAGNOSIS — R41840 Attention and concentration deficit: Secondary | ICD-10-CM

## 2019-06-02 NOTE — Therapy (Signed)
[facility] [address] [place], [place], [address] Phone: [phone]   Fax:  [phone]  Occupational Therapy Treatment  Patient Details  Name: Jean Carlson MRN: [id number] Date of Birth: 2001-01-05 Referring Provider (OT): Dr. Berline Chough   Encounter Date: 06/02/2019  OT End of Session - 06/02/19 1106    Visit Number  6    Number of Visits  17    Date for OT Re-Evaluation  07/01/19    Authorization Type  BCBS    OT Start Time  1104    OT Stop Time  1143    OT Time Calculation (min)  39 min    Activity Tolerance  Patient tolerated treatment well    Behavior During Therapy  [facility] for tasks assessed/performed       Past Medical History:  Diagnosis Date  . ADHD     Past Surgical History:  Procedure Laterality Date  . WISDOM TOOTH EXTRACTION     x4    There were no vitals filed for this visit.  Subjective Assessment - 06/02/19 1105    Subjective   Denies pain    Pertinent History  demylenating disease/ MS, ADHD, Depression, tobacco use    Patient Stated Goals  improve left hand coordination    Currently in Pain?  No/denies            Treatment:Graded clothespins with LUE for sustained pinch, min difficulty Copying small peg design with LUE for increased fine motor coordination, min difficulty, removing with in hand manipulation. Pt was provided with community MS resource sheet with support group locations.                OT Education - 06/02/19 1110    Education Details  reveiwed red theraband HEP- 15 reps each    Person(s) Educated  Patient    Methods  Explanation;Demonstration;Verbal cues    Comprehension  Verbalized understanding;Returned demonstration;Verbal cues required       OT Short Term Goals - 05/31/19 1109      OT SHORT TERM GOAL #1   Title  I with HEP for coordination and UE strength-06/01/19    Time  4    Period  Weeks    Status  On-going   ongoing, met for  coordination, strengthening HEP just issued   Target Date  06/01/19      OT SHORT TERM GOAL #2   Title  Pt will verbalize understanding of energy conservation techniques    Time  4    Period  Weeks    Status  On-going      OT SHORT TERM GOAL #3   Title  Pt will verbalize understanding of compensatory strategies for short term memory and cognitive deficits.    Time  4    Period  Weeks    Status  Achieved      OT SHORT TERM GOAL #4   Title  Pt will verbalize understanding of benefits of a routine/ daily schedule and utilize modified independently    Time  4    Period  Weeks    Status  On-going   Pt reports she is still working on getting on a Doctor, hospital schedule     OT SHORT TERM GOAL #5   Title  Pt will increase LUE grip strength by 5 lbs for improved LUE functional use during ADLs.    Baseline  RUE 76.2, LUE 56.2    Time  4    Period  Weeks    Status  On-going   57.3 lbs       OT Long Term Goals - 05/19/19 0912      OT LONG TERM GOAL #1   Title  Pt will demonstrate ability to perform a cognitive task and physical task simultaneously with 90% or better accuracy in prep for driving.    Time  8    Period  Weeks    Status  On-going      OT LONG TERM GOAL #2   Title  Pt will demonstrate improved fine motor coordination for ADLS as evidenced by decreasing 9 hole peg test score to 35 secs or less    Baseline  RUE 27.85, LUE 39.19    Time  8    Period  Weeks    Status  On-going      OT LONG TERM GOAL #3   Title  Pt will demonstrate improved LUE functional use for ADLs as evidenced by increasing box/ blocks score to 48 blocks or greater.    Baseline  RUE 53, LUE 43    Time  8    Period  Weeks    Status  On-going      OT LONG TERM GOAL #4   Title  Pt will verbalize understanding of MS related resources    Time  8    Period  Weeks    Status  On-going      OT LONG TERM GOAL #5   Title  Pt will perform simple stove top cooking task modified I demonstrating good safety  awareness.    Time  4    Period  Weeks    Status  On-going            Plan - 06/02/19 1106    Clinical Impression Statement  Pt is progressing towards goals with improving strength and coordination.    OT Occupational Profile and History  Detailed Assessment- Review of Records and additional review of physical, cognitive, psychosocial history related to current functional performance    Occupational performance deficits (Please refer to evaluation for details):  ADL's;IADL's;Leisure;Social Participation;Education    Body Structure / Function / Physical Skills  ADL;Strength;Mobility;Balance;UE functional use;FMC;Coordination;Gait;GMC;Decreased knowledge of precautions;Decreased knowledge of use of DME;Dexterity;IADL;Sensation    Cognitive Skills  Attention;Energy/Drive;Memory;Safety Awareness;Sequencing;Thought    Rehab Potential  Good    Clinical Decision Making  Limited treatment options, no task modification necessary    Comorbidities Affecting Occupational Performance:  May have comorbidities impacting occupational performance    Modification or Assistance to Complete Evaluation   No modification of tasks or assist necessary to complete eval    OT Frequency  2x / week    OT Duration  8 weeks    OT Treatment/Interventions  Self-care/ADL training;Therapeutic exercise;Functional Mobility Training;Balance training;Manual Therapy;Neuromuscular education;Aquatic Therapy;Energy conservation;Therapeutic activities;Cognitive remediation/compensation;Cryotherapy;Paraffin;Visual/perceptual remediation/compensation;Fluidtherapy;Moist Heat;Passive range of motion;Patient/family education    Plan  continue to work towards goals, coordination, grip strength, review red theraband HEP    Consulted and Agree with Plan of Care  Patient;Family member/caregiver    Family Member Consulted  mother       Patient will benefit from skilled therapeutic intervention in order to improve the following deficits  and impairments:   Body Structure / Function / Physical Skills: ADL, Strength, Mobility, Balance, UE functional use, FMC, Coordination, Gait, GMC, Decreased knowledge of precautions, Decreased knowledge of use of DME, Dexterity, IADL, Sensation Cognitive Skills: Attention, Energy/Drive, Memory, Safety Awareness, Sequencing, Thought     Visit  Diagnosis: Other symptoms and signs involving the nervous system  Muscle weakness (generalized)  Other lack of coordination  Attention and concentration deficit    Problem List Patient Active Problem List   Diagnosis Date Noted  . High risk medication use 05/03/2019  . Vitamin D deficiency 05/03/2019  . Depression with anxiety 05/03/2019  . Class 1 obesity due to excess calories without serious comorbidity with body mass index (BMI) of 31.0 to 31.9 in adult   . Neurogenic bowel   . Hypokalemia   . Neurogenic bladder 04/18/2019  . Demyelinating disorder (HCC) 04/12/2019  . ADHD 04/12/2019  . Multiple sclerosis (HCC) 04/10/2019  . Tobacco abuse 04/10/2019    Carey Johndrow 06/02/2019, 11:11 AM  [facility] [address] [place], [place], [address] Phone: [phone]   Fax:  [phone]  Name: BETZABE LIAS MRN: [id number] Date of Birth: 2000/09/01

## 2019-06-05 ENCOUNTER — Ambulatory Visit: Payer: BC Managed Care – PPO | Admitting: Physical Therapy

## 2019-06-05 ENCOUNTER — Other Ambulatory Visit: Payer: Self-pay

## 2019-06-05 DIAGNOSIS — R2681 Unsteadiness on feet: Secondary | ICD-10-CM

## 2019-06-05 DIAGNOSIS — R29818 Other symptoms and signs involving the nervous system: Secondary | ICD-10-CM | POA: Diagnosis not present

## 2019-06-05 DIAGNOSIS — R471 Dysarthria and anarthria: Secondary | ICD-10-CM | POA: Diagnosis not present

## 2019-06-05 DIAGNOSIS — R296 Repeated falls: Secondary | ICD-10-CM | POA: Diagnosis not present

## 2019-06-05 DIAGNOSIS — R41844 Frontal lobe and executive function deficit: Secondary | ICD-10-CM | POA: Diagnosis not present

## 2019-06-05 DIAGNOSIS — R41840 Attention and concentration deficit: Secondary | ICD-10-CM | POA: Diagnosis not present

## 2019-06-05 DIAGNOSIS — R278 Other lack of coordination: Secondary | ICD-10-CM | POA: Diagnosis not present

## 2019-06-05 DIAGNOSIS — R2689 Other abnormalities of gait and mobility: Secondary | ICD-10-CM | POA: Diagnosis not present

## 2019-06-05 DIAGNOSIS — M6281 Muscle weakness (generalized): Secondary | ICD-10-CM | POA: Diagnosis not present

## 2019-06-05 NOTE — Therapy (Signed)
[facility] [address] [place], [place], [address] Phone: [phone]   Fax:  [phone]  Physical Therapy Treatment  Patient Details  Name: Jean Carlson MRN: [id number] Date of Birth: 2000/11/04 Referring Provider (PT): Charlton Amor, PA-C   Encounter Date: 06/05/2019  PT End of Session - 06/05/19 0940    Visit Number  12    Number of Visits  17    Date for PT Re-Evaluation  06/26/19    Authorization Type  BCBS    PT Start Time  0932    PT Stop Time  1016    PT Time Calculation (min)  44 min    Activity Tolerance  Patient tolerated treatment well    Behavior During Therapy  [facility] for tasks assessed/performed       Past Medical History:  Diagnosis Date  . ADHD     Past Surgical History:  Procedure Laterality Date  . WISDOM TOOTH EXTRACTION     x4    There were no vitals filed for this visit.  Subjective Assessment - 06/05/19 0939    Subjective  Pt played in 2 softball games on Saturday night - states she was having some trouble running and tracking ball in the outfield so the coach moved her to being the catcher which she states worked out much better beause she didn't have to run    Patient is accompained by:  --   friend   Pertinent History  ADHD    Patient Stated Goals  Play softball, play with the dog    Currently in Pain?  No/denies                       [facility] Adult PT Treatment/Exercise - 06/05/19 0001      Transfers   Transfers  Sit to Stand    Number of Reps  Other reps (comment)   5   Comments  feet on blue Airex - no UE support used from mat table       Neuro Re-ed    Neuro Re-ed Details   1/2 kneeling on each leg on mat on floor - EO and then EC for 10 sec hold; head turns horizontally 5 reps and then vertically 5 reps ; EC  - static hold 10 secs; head turns EC 5 reps horizontal and 5 reps vertical with UE support on mat table prn      Knee/Hip Exercises:  Stretches   Active Hamstring Stretch  Right;Left;1 rep;30 seconds   runner's stretch - foot on 2nd step   Gastroc Stretch  Both;1 rep;30 seconds   heels off edge of step     Knee/Hip Exercises: Plyometrics   Bilateral Jumping  1 set;5 reps   on mini- trampoline with RUE support on // bar prn   Other Plyometric Exercises  jumping with EC on mini trampoline with RUE support on // bar      Knee/Hip Exercises: Standing   Forward Step Up  Both;1 set;5 reps;Hand Hold: 1;Step Height: 6"   moderate crepitus in Lt knee      Pt performed amb. Forward approx. 35' x 2 reps - tracking ball clockwise & counterclockwise  standing on pillows in corner - EO - horizontal head turns 5 reps with targets on either side of room; vertical head  turns with targets 5 reps        PT Short Term Goals - 06/05/19 2014  PT SHORT TERM GOAL #1   Title  Pt will initiate and demonstrate compliance with land based HEP.    Time  4    Period  Weeks    Status  Achieved    Target Date  05/27/19      PT SHORT TERM GOAL #2   Title  Pt will improve BERG balance to 56/56    Time  4    Period  Weeks    Status  Achieved    Target Date  05/27/19      PT SHORT TERM GOAL #3   Title  Pt will improve FGA to 28/30    Time  4    Period  Weeks    Status  Achieved    Target Date  05/27/19        PT Long Term Goals - 06/05/19 2014      PT LONG TERM GOAL #1   Title  Pt will demonstrate compliance with land and aquatic HEP  (06/26/19 date for all LTG)    Time  8    Period  Weeks    Status  New      PT LONG TERM GOAL #2   Title  Pt will improve FGA to 30/30 to indicate low falls risk    Time  8    Period  Weeks    Status  Achieved      PT LONG TERM GOAL #3   Title  Pt will demonstrate ability to transfer safely floor <> stand MOD I multiple times    Time  8    Period  Weeks    Status  New      PT LONG TERM GOAL #4   Title  Pt will demonstrate ability to catch/throw softball and jog in grass x 200'  without LOB    Time  8    Period  Weeks    Status  New      PT LONG TERM GOAL #5   Title  Pt will demonstrate improved use of VOR as indicated by decrease in DVA to 3-4 line difference    Baseline  6 line difference    Time  8    Period  Weeks    Status  New            Plan - 06/05/19 0940    Clinical Impression Statement  Pt had mild LOB with EC with head turns with standing on compliant surface - able to recover with CGA.  Pt c/o oscillopsia when she looks up while running, as she does when she plays softball.  Pt reports the problem is worse at dusk and states she doesn't have this problem during the daytime.  Pt demonstrates decreased VOR and decreased vestibular input in maintaining balance.    Personal Factors and Comorbidities  Comorbidity 1    Comorbidities  ADHD    Examination-Activity Limitations  Locomotion Level;Stairs    Examination-Participation Restrictions  Community Activity;Driving    Stability/Clinical Decision Making  Stable/Uncomplicated    Rehab Potential  Excellent    PT Frequency  2x / week    PT Duration  8 weeks    PT Treatment/Interventions  ADLs/Self Care Home Management;Aquatic Therapy;Electrical Stimulation;Gait training;Stair training;Functional mobility training;Therapeutic activities;Therapeutic exercise;Balance training;Neuromuscular re-education;Patient/family education;Orthotic Fit/Training    PT Next Visit Plan  Balance on compliant surfaces and more plyometric/dynamic movement - jump, jog, agility ladder.  Progress VOR training and gaze stability on moving targets.  Softball outside  on grass.  Trial Bioness for strengthening.    Consulted and Agree with Plan of Care  Patient       Patient will benefit from skilled therapeutic intervention in order to improve the following deficits and impairments:  Abnormal gait, Decreased balance, Decreased coordination, Decreased strength, Difficulty walking, Impaired tone  Visit Diagnosis: Unsteadiness on  feet  Muscle weakness (generalized)  Other symptoms and signs involving the nervous system     Problem List Patient Active Problem List   Diagnosis Date Noted  . High risk medication use 05/03/2019  . Vitamin D deficiency 05/03/2019  . Depression with anxiety 05/03/2019  . Class 1 obesity due to excess calories without serious comorbidity with body mass index (BMI) of 31.0 to 31.9 in adult   . Neurogenic bowel   . Hypokalemia   . Neurogenic bladder 04/18/2019  . Demyelinating disorder (HCC) 04/12/2019  . ADHD 04/12/2019  . Multiple sclerosis (HCC) 04/10/2019  . Tobacco abuse 04/10/2019    Kary Kos, PT 06/05/2019, 8:16 PM  [facility] [address] [place], [place], [address] Phone: [phone]   Fax:  [phone]  Name: Jean Carlson MRN: [id number] Date of Birth: 2000-08-29

## 2019-06-12 ENCOUNTER — Other Ambulatory Visit: Payer: Self-pay

## 2019-06-12 ENCOUNTER — Encounter: Payer: Self-pay | Admitting: Physical Therapy

## 2019-06-12 ENCOUNTER — Ambulatory Visit: Payer: BC Managed Care – PPO | Admitting: Physical Therapy

## 2019-06-12 DIAGNOSIS — R296 Repeated falls: Secondary | ICD-10-CM | POA: Diagnosis not present

## 2019-06-12 DIAGNOSIS — R471 Dysarthria and anarthria: Secondary | ICD-10-CM | POA: Diagnosis not present

## 2019-06-12 DIAGNOSIS — R41840 Attention and concentration deficit: Secondary | ICD-10-CM | POA: Diagnosis not present

## 2019-06-12 DIAGNOSIS — R41844 Frontal lobe and executive function deficit: Secondary | ICD-10-CM | POA: Diagnosis not present

## 2019-06-12 DIAGNOSIS — R2689 Other abnormalities of gait and mobility: Secondary | ICD-10-CM | POA: Diagnosis not present

## 2019-06-12 DIAGNOSIS — R29818 Other symptoms and signs involving the nervous system: Secondary | ICD-10-CM | POA: Diagnosis not present

## 2019-06-12 DIAGNOSIS — R278 Other lack of coordination: Secondary | ICD-10-CM | POA: Diagnosis not present

## 2019-06-12 DIAGNOSIS — M6281 Muscle weakness (generalized): Secondary | ICD-10-CM | POA: Diagnosis not present

## 2019-06-12 DIAGNOSIS — R2681 Unsteadiness on feet: Secondary | ICD-10-CM

## 2019-06-12 NOTE — Therapy (Signed)
[facility] [address] [place], [place], [address] Phone: [phone]   Fax:  [phone]  Physical Therapy Treatment  Patient Details  Name: Jean Carlson MRN: [id number] Date of Birth: [birth year] Referring Provider (PT): Charlton Amor, PA-C   Encounter Date: 06/12/2019  PT End of Session - 06/12/19 1754    Visit Number  13    Number of Visits  17    Date for PT Re-Evaluation  06/26/19    Authorization Type  BCBS    PT Start Time  1415    PT Stop Time  1500    PT Time Calculation (min)  45 min    Equipment Utilized During Treatment  Other (comment)   ankle buoyancy cuffs   Activity Tolerance  Patient tolerated treatment well    Behavior During Therapy  [facility] for tasks assessed/performed       Past Medical History:  Diagnosis Date  . ADHD     Past Surgical History:  Procedure Laterality Date  . WISDOM TOOTH EXTRACTION     x4    There were no vitals filed for this visit.  Subjective Assessment - 06/12/19 1752    Subjective  Went to beach last week and did some swimming.  Got fatigued with treading deep water.  Reporting still having issues with vision while playing softball especially at night.    Patient is accompained by:  --   friend   Pertinent History  ADHD    Patient Stated Goals  Play softball, play with the dog    Currently in Pain?  No/denies       Aquatic therapy at [facility] - pool temp. 87.4 degrees   Patient seen for aquatic therapy today.  Treatment took place in water 3.5-4 feet deep depending upon activity.  Pt entered the pool via  ramp negotiation with use of hand rails with supervision.  Pt performed bil. Hamstring and heel cord stretch - runner's stretch 30 sec hold x 1 rep; heel cord stretch with toes/forefoot on pool wall 30 sec hold x 1 rep  Pt performed water walking forwards, backwards and sideways 2 reps approx.78m across pool with cues for reciprocal arm movement;   Sideways stepping with squats 26m x 2 reps with use of bar bells for incr. Resistance for strengthening and for improved balance  Pt performed jogging 50' x 2 reps across pool with cues for reciprocal arm movement  Bil LE marching, SLR, hip abd, hip extension, hamstring curl and marching pushing straight into hip extension all x 15 reps each side with intermittent UE support.  Squats bil LE x 15 then single leg x 15 each.  Did not require UE support.  Supine bicylcing LE's x 28m x 2, 40m x 2 hip abd/add and UE abd/add with supervision for safety,    PT Short Term Goals - 06/05/19 2014      PT SHORT TERM GOAL #1   Title  Pt will initiate and demonstrate compliance with land based HEP.    Time  4    Period  Weeks    Status  Achieved    Target Date  05/27/19      PT SHORT TERM GOAL #2   Title  Pt will improve BERG balance to 56/56    Time  4    Period  Weeks    Status  Achieved    Target Date  05/27/19      PT SHORT TERM GOAL #  3   Title  Pt will improve FGA to 28/30    Time  4    Period  Weeks    Status  Achieved    Target Date  05/27/19        PT Long Term Goals - 06/05/19 2014      PT LONG TERM GOAL #1   Title  Pt will demonstrate compliance with land and aquatic HEP  (06/26/19 date for all LTG)    Time  8    Period  Weeks    Status  New      PT LONG TERM GOAL #2   Title  Pt will improve FGA to 30/30 to indicate low falls risk    Time  8    Period  Weeks    Status  Achieved      PT LONG TERM GOAL #3   Title  Pt will demonstrate ability to transfer safely floor <> stand MOD I multiple times    Time  8    Period  Weeks    Status  New      PT LONG TERM GOAL #4   Title  Pt will demonstrate ability to catch/throw softball and jog in grass x 200' without LOB    Time  8    Period  Weeks    Status  New      PT LONG TERM GOAL #5   Title  Pt will demonstrate improved use of VOR as indicated by decrease in DVA to 3-4 line difference    Baseline  6 line  difference    Time  8    Period  Weeks    Status  New            Plan - 06/12/19 1755    Clinical Impression Statement  Pt continues to report difficulty with vision at dusk while playing ball.  Has progressed well with aquatic sessions and no c/o issues while in pool.  Cont PT per POC.    Personal Factors and Comorbidities  Comorbidity 1    Comorbidities  ADHD    Examination-Activity Limitations  Locomotion Level;Stairs    Examination-Participation Restrictions  Community Activity;Driving    Stability/Clinical Decision Making  Stable/Uncomplicated    Rehab Potential  Excellent    PT Frequency  2x / week    PT Duration  8 weeks    PT Treatment/Interventions  ADLs/Self Care Home Management;Aquatic Therapy;Electrical Stimulation;Gait training;Stair training;Functional mobility training;Therapeutic activities;Therapeutic exercise;Balance training;Neuromuscular re-education;Patient/family education;Orthotic Fit/Training    PT Next Visit Plan  Balance on compliant surfaces and more plyometric/dynamic movement - jump, jog, agility ladder.  Progress VOR training and gaze stability on moving targets.  Softball outside on grass.  Trial Bioness for strengthening.    Consulted and Agree with Plan of Care  Patient       Patient will benefit from skilled therapeutic intervention in order to improve the following deficits and impairments:  Abnormal gait, Decreased balance, Decreased coordination, Decreased strength, Difficulty walking, Impaired tone  Visit Diagnosis: Unsteadiness on feet  Other symptoms and signs involving the nervous system  Other abnormalities of gait and mobility     Problem List Patient Active Problem List   Diagnosis Date Noted  . High risk medication use 05/03/2019  . Vitamin D deficiency 05/03/2019  . Depression with anxiety 05/03/2019  . Class 1 obesity due to excess calories without serious comorbidity with body mass index (BMI) of 31.0 to 31.9 in adult   .  Neurogenic bowel   . Hypokalemia   . Neurogenic bladder 04/18/2019  . Demyelinating disorder ([place]) 04/12/2019  . ADHD 04/12/2019  . Multiple sclerosis ([place]) 04/10/2019  . Tobacco abuse 04/10/2019   Narda Bonds, PTA [place] 06/12/19 6:00 PM Phone: [phone] Fax: [address] [place], [place], [address] Phone: [phone]   Fax:  [phone]  Name: Jean Carlson MRN: [id number] Date of Birth: 17-Jul-2000

## 2019-06-14 ENCOUNTER — Encounter: Payer: Self-pay | Admitting: Occupational Therapy

## 2019-06-14 ENCOUNTER — Other Ambulatory Visit: Payer: Self-pay

## 2019-06-14 ENCOUNTER — Ambulatory Visit: Payer: BC Managed Care – PPO | Admitting: Occupational Therapy

## 2019-06-14 DIAGNOSIS — R29818 Other symptoms and signs involving the nervous system: Secondary | ICD-10-CM

## 2019-06-14 DIAGNOSIS — R41840 Attention and concentration deficit: Secondary | ICD-10-CM

## 2019-06-14 DIAGNOSIS — R2681 Unsteadiness on feet: Secondary | ICD-10-CM

## 2019-06-14 DIAGNOSIS — M6281 Muscle weakness (generalized): Secondary | ICD-10-CM | POA: Diagnosis not present

## 2019-06-14 DIAGNOSIS — R296 Repeated falls: Secondary | ICD-10-CM | POA: Diagnosis not present

## 2019-06-14 DIAGNOSIS — R41844 Frontal lobe and executive function deficit: Secondary | ICD-10-CM | POA: Diagnosis not present

## 2019-06-14 DIAGNOSIS — R278 Other lack of coordination: Secondary | ICD-10-CM

## 2019-06-14 DIAGNOSIS — R2689 Other abnormalities of gait and mobility: Secondary | ICD-10-CM | POA: Diagnosis not present

## 2019-06-14 DIAGNOSIS — R471 Dysarthria and anarthria: Secondary | ICD-10-CM | POA: Diagnosis not present

## 2019-06-14 NOTE — Therapy (Signed)
[facility] [address] [place], [place], [address] Phone: [phone]   Fax:  [phone]  Occupational Therapy Treatment  Patient Details  Name: Jean Carlson MRN: [id number] Date of Birth: 2000-07-10 Referring Provider (OT): Dr. Dagoberto Ligas   Encounter Date: 06/14/2019  OT End of Session - 06/14/19 1114    Visit Number  7    Number of Visits  17    Date for OT Re-Evaluation  07/01/19    Authorization Type  BCBS    OT Start Time  1111   arrived late   OT Stop Time  1145    OT Time Calculation (min)  34 min    Activity Tolerance  Patient tolerated treatment well    Behavior During Therapy  Impulsive       Past Medical History:  Diagnosis Date  . ADHD     Past Surgical History:  Procedure Laterality Date  . WISDOM TOOTH EXTRACTION     x4    There were no vitals filed for this visit.  Subjective Assessment - 06/14/19 1113    Subjective   Recovering from bike week    Pertinent History  demylenating disease/ MS, ADHD, Depression, tobacco use    Patient Stated Goals  improve left hand coordination    Currently in Pain?  No/denies         Picking up blocks using gripper set on level 2 for incr sustained grip strength.  Ambulating while tossing 2 scarves alternating (1 in each hand) with min difficulty for divided attention and with conversation.   Then, ambulating while juggling 2 balls with min difficulty with L hand coordination. Impulsivity noted and pt came too close to table too quickly with drop.  Encouraged pt to slow down.       OT Education - 06/14/19 1118    Education Details  Energy Conversation [employer]) Educated  Patient    Methods  Explanation;Verbal cues;Handout    Comprehension  Verbalized understanding       OT Short Term Goals - 06/14/19 1203      OT SHORT TERM GOAL #1   Title  I with HEP for coordination and UE strength-06/01/19    Time  4    Period  Weeks    Status  On-going   ongoing, met for coordination, strengthening HEP just issued   Target Date  06/01/19      OT SHORT TERM GOAL #2   Title  Pt will verbalize understanding of energy conservation techniques    Time  4    Period  Weeks    Status  Achieved   06/14/19     OT SHORT TERM GOAL #3   Title  Pt will verbalize understanding of compensatory strategies for short term memory and cognitive deficits.    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    Baseline  RUE  76.2, LUE 56.2    Time  4    Period  Weeks    Status  On-going   57.3 lbs       OT Long Term Goals - 06/02/19 1134      OT LONG TERM GOAL #1   Title  Pt will demonstrate ability to perform a cognitive task and physical task simultaneously with 90% or better accuracy in prep for driving.    Time  8    Period  Weeks    Status  On-going      OT LONG TERM GOAL #2   Title  Pt will demonstrate improved fine motor coordination for ADLS as evidenced by decreasing 9 hole peg test score to 35 secs or less    Baseline  RUE 27.85, LUE 39.19    Time  8    Period  Weeks    Status  On-going      OT LONG TERM GOAL #3   Title  Pt will demonstrate improved LUE functional use for ADLs as evidenced by increasing box/ blocks score to 48 blocks or greater.    Baseline  RUE 53, LUE 43    Time  8    Period  Weeks    Status  On-going      OT LONG TERM GOAL #4   Title  Pt will verbalize understanding of MS related resources    Time  8    Period  Weeks    Status  On-going      OT LONG TERM GOAL #5   Title  Pt will perform simple stove top  cooking task modified I demonstrating good safety awareness.    Time  4    Period  Weeks    Status  On-going            Plan - 06/14/19 1115    Clinical Impression Statement  Pt continues to progress towards goals and verbalized understanding of energy conservation strategies    OT Occupational Profile and History  Detailed Assessment- Review of Records and additional review of physical, cognitive, psychosocial history related to current functional performance    Occupational performance deficits (Please refer to evaluation for details):  ADL's;IADL's;Leisure;Social Participation;Education    Body Structure / Function / Physical Skills  ADL;Strength;Mobility;Balance;UE functional use;FMC;Coordination;Gait;GMC;Decreased knowledge of precautions;Decreased knowledge of use of DME;Dexterity;IADL;Sensation    Cognitive Skills  Attention;Energy/Drive;Memory;Safety Awareness;Sequencing;Thought    Rehab Potential  Good    Clinical Decision Making  Limited treatment options, no task modification necessary    Comorbidities Affecting Occupational Performance:  May have comorbidities impacting occupational performance    Modification or Assistance to Complete Evaluation   No modification of tasks or assist necessary to complete eval    OT Frequency  2x / week    OT Duration  8 weeks    OT Treatment/Interventions  Self-care/ADL training;Therapeutic exercise;Functional Mobility Training;Balance training;Manual Therapy;Neuromuscular education;Aquatic Therapy;Energy conservation;Therapeutic activities;Cognitive remediation/compensation;Cryotherapy;Paraffin;Visual/perceptual remediation/compensation;Fluidtherapy;Moist Heat;Passive range of motion;Patient/family education    Plan  work on picking up flat objects with L hand and tip pinch, pulling bag into hand for coordination as pt reports difficulty scratching with L hand, work towards remaining goals    Consulted and Agree with Plan of Care   Patient;Family member/caregiver    Family Member Consulted  mother       Patient will benefit from skilled therapeutic intervention in order to improve the following deficits and impairments:   Body Structure / Function / Physical Skills: ADL, Strength, Mobility,  Balance, UE functional use, FMC, Coordination, Gait, GMC, Decreased knowledge of precautions, Decreased knowledge of use of DME, Dexterity, IADL, Sensation Cognitive Skills: Attention, Energy/Drive, Memory, Safety Awareness, Sequencing, Thought     Visit Diagnosis: Other symptoms and signs involving the nervous system  Unsteadiness on feet  Muscle weakness (generalized)  Other lack of coordination  Other abnormalities of gait and mobility  Attention and concentration deficit  Frontal lobe and executive function deficit    Problem List Patient Active Problem List   Diagnosis Date Noted  . High risk medication use 05/03/2019  . Vitamin D deficiency 05/03/2019  . Depression with anxiety 05/03/2019  . Class 1 obesity due to excess calories without serious comorbidity with body mass index (BMI) of 31.0 to 31.9 in adult   . Neurogenic bowel   . Hypokalemia   . Neurogenic bladder 04/18/2019  . Demyelinating disorder ([place]) 04/12/2019  . ADHD 04/12/2019  . Multiple sclerosis ([place]) 04/10/2019  . Tobacco abuse 04/10/2019    [facility] 06/14/2019, 12:05 PM  [facility] [address] [place], [place], [address] Phone: [phone]   Fax:  [phone]  Name: Jean Carlson MRN: [id number] Date of Birth: [birth year]   Vianne Bulls, OTR/L [facility] [address] [place], [place]  [address] [phone] phone [phone] 06/14/19 12:05 PM

## 2019-06-14 NOTE — Patient Instructions (Signed)
Energy Conservation Techniques  1.  Sit for as many activities as possible.  2.  Use slow, smooth movements.  Rushing increased discomfort.  3. Determine the necessity of performing the task.  Simplify those tasks that are necessary.  (Get clothes out of the dryer when they're warm instead of ironing, let dishes air dry, etc.)  4. Take frequent rests both during and between activities.  Avoid repetitive tasks.  5. Pre-plan your activities; try a daily and/or weekly schedule.  Spread out the activities that are most fatiguing (break up cleaning tasks over multiple days).  6.  Remember to plan a balance of work, rest and recreation.  7. Consider the best time for each activity.  Do the most exertive task when you have the most energy.  8. Don't carry items if you can push them.  Slide, don't lift.  Push, don't pull.  9. Utilize two hands when appropriate.  10.   Maintain good posture and use proper body mechanics. a. Avoid remaining in one position for too long. b. When lifting , bend at the knees, not at the waist.  Exhale when bending down, inhale when straightening up. c. Carry objects as close to your body and as near to the center of the pelvis.  11.  Avoid wasted body movements (position yourself for the task so that you avoid bending, twisting, etc. when possible).   12. Select the best working environment.  Consider lighting, ventilation, clothing, and equipment.   a. Hot, cold, or humid weather puts increased stress on the body.   b. Dressing in layers is more flexible. c. Walk in climate-controlled conditions if weather is not ideal or walk in early morning or late evening in the summer.  13. Organize your storage areas, making the items you use daily convenient.   a. Store heaviest items at waist height.   b. Store frequently used items between shoulder and knee height.   c. Consider leaving frequently used items on countertops. (You can organize in storage baskets based  on time used/purpose.) d. Avoid clutter  14. Feelings and emotions can be real causes of fatigue.  Try to avoid unnecessary worry, irritation, or frustration.  Avoid stress, it can also be a source of fatigue.  15.   Get help from other people for difficult tasks.  16. Explore equipment or items that may be able to do the job for you with greater ease.  (Electric can openers, blenders, lightweight items for cleaning, etc.) 

## 2019-06-16 ENCOUNTER — Other Ambulatory Visit: Payer: Self-pay

## 2019-06-16 ENCOUNTER — Encounter: Payer: Self-pay | Admitting: Physical Therapy

## 2019-06-16 ENCOUNTER — Ambulatory Visit: Payer: BC Managed Care – PPO

## 2019-06-16 ENCOUNTER — Ambulatory Visit: Payer: BC Managed Care – PPO | Admitting: Physical Therapy

## 2019-06-16 ENCOUNTER — Encounter: Payer: Self-pay | Admitting: Occupational Therapy

## 2019-06-16 ENCOUNTER — Ambulatory Visit: Payer: BC Managed Care – PPO | Admitting: Occupational Therapy

## 2019-06-16 DIAGNOSIS — N130 Hydronephrosis with ureteropelvic junction obstruction: Secondary | ICD-10-CM | POA: Diagnosis not present

## 2019-06-16 DIAGNOSIS — R41840 Attention and concentration deficit: Secondary | ICD-10-CM | POA: Diagnosis not present

## 2019-06-16 DIAGNOSIS — R296 Repeated falls: Secondary | ICD-10-CM | POA: Diagnosis not present

## 2019-06-16 DIAGNOSIS — R2689 Other abnormalities of gait and mobility: Secondary | ICD-10-CM | POA: Diagnosis not present

## 2019-06-16 DIAGNOSIS — R350 Frequency of micturition: Secondary | ICD-10-CM | POA: Diagnosis not present

## 2019-06-16 DIAGNOSIS — R471 Dysarthria and anarthria: Secondary | ICD-10-CM

## 2019-06-16 DIAGNOSIS — R278 Other lack of coordination: Secondary | ICD-10-CM

## 2019-06-16 DIAGNOSIS — R29818 Other symptoms and signs involving the nervous system: Secondary | ICD-10-CM | POA: Diagnosis not present

## 2019-06-16 DIAGNOSIS — R351 Nocturia: Secondary | ICD-10-CM | POA: Diagnosis not present

## 2019-06-16 DIAGNOSIS — M6281 Muscle weakness (generalized): Secondary | ICD-10-CM | POA: Diagnosis not present

## 2019-06-16 DIAGNOSIS — R2681 Unsteadiness on feet: Secondary | ICD-10-CM | POA: Diagnosis not present

## 2019-06-16 DIAGNOSIS — N3941 Urge incontinence: Secondary | ICD-10-CM | POA: Diagnosis not present

## 2019-06-16 DIAGNOSIS — R41844 Frontal lobe and executive function deficit: Secondary | ICD-10-CM | POA: Diagnosis not present

## 2019-06-16 NOTE — Therapy (Signed)
[facility] [address] [place], [place], [address] Phone: [phone]   Fax:  [phone]  Speech Language Pathology Treatment/Discharge Summary  Patient Details  Name: Jean Carlson MRN: [id number] Date of Birth: [birth year] Referring Provider (SLP): Nicholson, D. (referral); Dena Billet, [place] (documentation)   Encounter Date: 06/16/2019  End of Session - 06/16/19 1725    Visit Number  6    Number of Visits  13    Date for SLP Re-Evaluation  07/07/19    SLP Start Time  1105    SLP Stop Time   1145    SLP Time Calculation (min)  40 min    Activity Tolerance  Patient tolerated treatment well       Past Medical History:  Diagnosis Date  . ADHD     Past Surgical History:  Procedure Laterality Date  . WISDOM TOOTH EXTRACTION     x4    There were no vitals filed for this visit.  Subjective Assessment - 06/16/19 1118    Subjective  Pt tells SLP she didn't sleep the night before her beach trip.    Currently in Pain?  No/denies            ADULT SLP TREATMENT - 06/16/19 1133      General Information   Behavior/Cognition  Alert;Cooperative;Pleasant mood;Distractible      Treatment Provided   Treatment provided  Cognitive-Linquistic      Cognitive-Linquistic Treatment   Treatment focused on  Dysarthria    Skilled Treatment  SLP cont'd to reiterate to pt the importance of energy conservation, given her S statement. SLP postulates pt with very active and social lifestyle prior to dx of MS and she may need further encouragement with this. Pt and SLP spoke for 30 minutes in and out of the [place] room as well as outdoors and pt 100% intelligible. Pt stated she was more pleased with her talking now than prior to ST mostly due to symptoms have been greatly reduced or eliminated due to medication.       Assessment / Recommendations / Plan   Plan  Discharge SLP treatment due to (comment)   pt pleased with progress  - pt is WNL/WFL      Progression Toward Goals   Progression toward goals  --   d/c day- see goals      SLP Education - 06/16/19 1725    Education Details  energy conservation may mean taking nap or getting more sleep    Person(s) Educated  Patient    Methods  Explanation    Comprehension  Verbalized understanding       SLP Short Term Goals - 05/24/19 1420      SLP SHORT TERM GOAL #1   Title  pt will demo speech intelligibilty strategies in 10 minutes simple conversation x2 sessions    Baseline  05-23-19    Status  Achieved      SLP SHORT TERM GOAL #2   Title  pt will complete speech quality of life measure    Status  Achieved       SLP Long Term Goals - 06/16/19 1727      SLP LONG TERM GOAL #1   Title  pt will use compensatory strategies for 95% speech intelligibility in 15 minutes mod complex conversation x3 sessions    Baseline  05-24-19, 05-31-19    Period  --   or 13 total sessions, for all LTGs   Status  Achieved      SLP LONG TERM GOAL #2   Title  pt will report a higher QOL score than in first session    Status  Achieved      SLP LONG TERM GOAL #3   Title  pt will report using at least one functional strategy for stamina management re: voice/speech intelligibility between 3 sessions    Baseline  05-31-19, 5-2-121    Status  Partially Met       Plan - 06/16/19 1726    Clinical Impression Statement  Pt presents with WNL speech again today. Pt agreed with d/c today. Pt with premorbid dx of ADHD;at baseline per pt and mother - SLP observed signs of this today in session.    Treatment/Interventions  Functional tasks;Compensatory techniques;SLP instruction and feedback;Patient/family education;Internal/external aids;Multimodal communcation approach    Potential to Achieve Goals  Good       Patient will benefit from skilled therapeutic intervention in order to improve the following deficits and impairments:   Dysarthria and anarthria   SPEECH THERAPY DISCHARGE  SUMMARY  Visits from Start of Care: 6  Current functional level related to goals / functional outcomes: Pt met all STGs and partilly met all LTGs. Her speech is 95%+ intelligible in the last 2-3 sessions, improving much since the initiation of MS meds. SLP also educated pt on stamina management however with pt's lifestyle this message will likely need to be repeated. She is pleased with progress.   Remaining deficits: Mild intermittent dysarthria, more pronounced with fatigue.   Education / Equipment: Air traffic controller.   Plan: Patient agrees to discharge.  Patient goals were partially met. Patient is being discharged due to being pleased with the current functional level.  ?????       Problem List Patient Active Problem List   Diagnosis Date Noted  . High risk medication use 05/03/2019  . Vitamin D deficiency 05/03/2019  . Depression with anxiety 05/03/2019  . Class 1 obesity due to excess calories without serious comorbidity with body mass index (BMI) of 31.0 to 31.9 in adult   . Neurogenic bowel   . Hypokalemia   . Neurogenic bladder 04/18/2019  . Demyelinating disorder ([place]) 04/12/2019  . ADHD 04/12/2019  . Multiple sclerosis ([place]) 04/10/2019  . Tobacco abuse 04/10/2019    [facility] ,MS, CCC-SLP  06/16/2019, 5:27 PM  [facility] [address] [place], [place], [address] Phone: [phone]   Fax:  [phone]   Name: Jean Carlson MRN: [id number] Date of Birth: 2000/04/19

## 2019-06-16 NOTE — Patient Instructions (Addendum)
Gaze Stabilization: Standing Feet Apart (Compliant Surface)    Feet apart on pillow, keeping eyes on target on wall ___3_ feet away, tilt head down 15-30 and move head side to side for __60__ seconds. Repeat while moving head up and down for __60__ seconds. Do __2__ sessions per day.    

## 2019-06-16 NOTE — Therapy (Signed)
[facility] [address] [place], [place], [address] Phone: [phone]   Fax:  [phone]  Occupational Therapy Treatment  Patient Details  Name: Jean Carlson MRN: [id number] Date of Birth: 12-19-2000 Referring Provider (OT): Dr. Berline Chough   Encounter Date: 06/16/2019  OT End of Session - 06/16/19 1023    Visit Number  8    Number of Visits  17    Date for OT Re-Evaluation  07/14/19   extended due to missed visits   Authorization Type  BCBS    Authorization Time Period  week 4/8    OT Start Time  1020    OT Stop Time  1058    OT Time Calculation (min)  38 min    Activity Tolerance  Patient tolerated treatment well    Behavior During Therapy  Impulsive       Past Medical History:  Diagnosis Date  . ADHD     Past Surgical History:  Procedure Laterality Date  . WISDOM TOOTH EXTRACTION     x4    There were no vitals filed for this visit.  Subjective Assessment - 06/16/19 1022    Subjective   Denies pain    Pertinent History  demylenating disease/ MS, ADHD, Depression, tobacco use    Patient Stated Goals  improve left hand coordination    Currently in Pain?  No/denies             Treatment: Grooved pegs for increased fine motor coordination only occasional difficulty, then removing with in hand manipulation. Gripper set at level 3 for sustained grip, min difficulty              OT Education - 06/16/19 1031    Education Details  upgraded theraband ex to green 15 reps each, min v.c initally then pt returned demonstration    Person(s) Educated  Patient    Methods  Explanation;Verbal cues    Comprehension  Verbalized understanding;Returned demonstration       OT Short Term Goals - 06/16/19 1024      OT SHORT TERM GOAL #1   Title  I with HEP for coordination and UE strength-06/01/19    Time  4    Period  Weeks    Status  On-going   ongoing, met for coordination, strengthening HEP  just issued   Target Date  06/01/19      OT SHORT TERM GOAL #2   Title  Pt will verbalize understanding of energy conservation techniques    Time  4    Period  Weeks    Status  Achieved   06/14/19     OT SHORT TERM GOAL #3   Title  Pt will verbalize understanding of compensatory strategies for short term memory and cognitive deficits.    Time  4    Period  Weeks    Status  Achieved      OT SHORT TERM GOAL #4   Title  Pt will verbalize understanding of benefits of a routine/ daily schedule and utilize modified independently    Time  4    Period  Weeks    Status  On-going   Pt reports she is still working on getting on a regular schedule     OT SHORT TERM GOAL #5   Title  Pt will increase LUE grip strength by 5 lbs for improved LUE functional use during ADLs.    Baseline  RUE 76.2, LUE 56.2  Time  4    Period  Weeks    Status  On-going   57.3 lbs       OT Long Term Goals - 06/16/19 1038      OT LONG TERM GOAL #1   Title  Pt will demonstrate ability to perform a cognitive task and physical task simultaneously with 90% or better accuracy in prep for driving.    Time  8    Period  Weeks    Status  On-going      OT LONG TERM GOAL #2   Title  Pt will demonstrate improved fine motor coordination for ADLS as evidenced by decreasing 9 hole peg test score to 35 secs or less    Baseline  RUE 27.85, LUE 39.19    Time  8    Period  Weeks    Status  On-going      OT LONG TERM GOAL #3   Title  Pt will demonstrate improved LUE functional use for ADLs as evidenced by increasing box/ blocks score to 48 blocks or greater.    Baseline  RUE 53, LUE 43    Time  8    Period  Weeks    Status  On-going      OT LONG TERM GOAL #4   Title  Pt will verbalize understanding of MS related resources    Time  8    Period  Weeks    Status  Achieved      OT LONG TERM GOAL #5   Title  Pt will perform simple stove top cooking task modified I demonstrating good safety awareness.    Time  4     Period  Weeks    Status  On-going            Plan - 06/16/19 1110    Clinical Impression Statement  Pt continues to progress towards goals with improving strength and coordination.    OT Occupational Profile and History  Detailed Assessment- Review of Records and additional review of physical, cognitive, psychosocial history related to current functional performance    Occupational performance deficits (Please refer to evaluation for details):  ADL's;IADL's;Leisure;Social Participation;Education    Body Structure / Function / Physical Skills  ADL;Strength;Mobility;Balance;UE functional use;FMC;Coordination;Gait;GMC;Decreased knowledge of precautions;Decreased knowledge of use of DME;Dexterity;IADL;Sensation    Cognitive Skills  Attention;Energy/Drive;Memory;Safety Awareness;Sequencing;Thought    Rehab Potential  Good    Clinical Decision Making  Limited treatment options, no task modification necessary    Comorbidities Affecting Occupational Performance:  May have comorbidities impacting occupational performance    Modification or Assistance to Complete Evaluation   No modification of tasks or assist necessary to complete eval    OT Frequency  2x / week    OT Duration  8 weeks    OT Treatment/Interventions  Self-care/ADL training;Therapeutic exercise;Functional Mobility Training;Balance training;Manual Therapy;Neuromuscular education;Aquatic Therapy;Energy conservation;Therapeutic activities;Cognitive remediation/compensation;Cryotherapy;Paraffin;Visual/perceptual remediation/compensation;Fluidtherapy;Moist Heat;Passive range of motion;Patient/family education    Plan  simple cooking task, work on picking up flat objects with L hand and tip pinch, pulling bag into hand for coordination as pt reports difficulty scratching with L hand,    Consulted and Agree with Plan of Care  Patient       Patient will benefit from skilled therapeutic intervention in order to improve the following  deficits and impairments:   Body Structure / Function / Physical Skills: ADL, Strength, Mobility, Balance, UE functional use, FMC, Coordination, Gait, GMC, Decreased knowledge of precautions, Decreased knowledge of use of DME,  Dexterity, IADL, Sensation Cognitive Skills: Attention, Energy/Drive, Memory, Safety Awareness, Sequencing, Thought     Visit Diagnosis: Other symptoms and signs involving the nervous system  Muscle weakness (generalized)  Other lack of coordination  Attention and concentration deficit  Frontal lobe and executive function deficit  Other abnormalities of gait and mobility    Problem List Patient Active Problem List   Diagnosis Date Noted  . High risk medication use 05/03/2019  . Vitamin D deficiency 05/03/2019  . Depression with anxiety 05/03/2019  . Class 1 obesity due to excess calories without serious comorbidity with body mass index (BMI) of 31.0 to 31.9 in adult   . Neurogenic bowel   . Hypokalemia   . Neurogenic bladder 04/18/2019  . Demyelinating disorder (HCC) 04/12/2019  . ADHD 04/12/2019  . Multiple sclerosis (HCC) 04/10/2019  . Tobacco abuse 04/10/2019    Dong Nimmons 06/16/2019, 11:11 AM Keene Breath, OTR/L [phone] Phone: [phone] 11:13 AM 06/16/19 [facility] [address] [place], [place], [address] Phone: [phone]   Fax:  [phone]  Name: Jean Carlson MRN: [id number] Date of Birth: [birth year]

## 2019-06-16 NOTE — Therapy (Signed)
[facility] [address] [place], [place], [address] Phone: [phone]   Fax:  [phone]  Physical Therapy Treatment  Patient Details  Name: Jean Carlson MRN: [id number] Date of Birth: 07/12/2000 Referring Provider (PT): Charlton Amor, PA-C   Encounter Date: 06/16/2019  PT End of Session - 06/16/19 1212    Visit Number  14    Number of Visits  17    Date for PT Re-Evaluation  06/26/19    Authorization Type  BCBS    PT Start Time  [phone]    PT Stop Time  1018    PT Time Calculation (min)  40 min    Activity Tolerance  Patient tolerated treatment well    Behavior During Therapy  [facility] for tasks assessed/performed       Past Medical History:  Diagnosis Date  . ADHD     Past Surgical History:  Procedure Laterality Date  . WISDOM TOOTH EXTRACTION     x4    There were no vitals filed for this visit.  Subjective Assessment - 06/16/19 0941    Subjective  Brought softball gear to practice.  Boyfriend proposed to her last night!    Patient is accompained by:  --   friend   Pertinent History  ADHD    Patient Stated Goals  Play softball, play with the dog    Currently in Pain?  No/denies                        [facility] Adult PT Treatment/Exercise - 06/16/19 1018      Neuro Re-ed    Neuro Re-ed Details   Outside on grass performed dynamic balance and gaze stability with soft ball soft toss while performing side stepping to L and R and then forwards and backwards walking along 250' distance x 2 reps.  Then performed high ball toss to simulate being in the outfield x 10 reps.  Pt reported mild knee pain and mild ankle rolling.      Vestibular Treatment/Exercise - 06/16/19 0945      Vestibular Treatment/Exercise   Vestibular Treatment Provided  Gaze    Gaze Exercises  X1 Viewing Horizontal;X1 Viewing Vertical      X1 Viewing Horizontal   Foot Position  seated > standing feet apart    Reps   3    Comments  60 seconds at first pt reporting spasms in neck but once PT moved target up slightly, spasms improved.  60 seconds in standing, no symptoms      X1 Viewing Vertical   Foot Position  seated > standing    Comments  60 seconds at first pt reporting spasms in neck but once PT moved target up slightly, spasms improved.  60 seconds in standing, no symptoms            PT Education - 06/16/19 1211    Education Details  will continue with land therapy and will add more PT visits next session, updated x1 viewing    Person(s) Educated  Patient    Methods  Explanation    Comprehension  Verbalized understanding          Gaze Stabilization: Standing Feet Apart (Compliant Surface)    Feet apart on pillow, keeping eyes on target on wall ___3_ feet away, tilt head down 15-30 and move head side to side for __60__ seconds. Repeat while moving head up and down for __60__ seconds. Do  __2__ sessions per day.    PT Short Term Goals - 06/05/19 2014      PT SHORT TERM GOAL #1   Title  Pt will initiate and demonstrate compliance with land based HEP.    Time  4    Period  Weeks    Status  Achieved    Target Date  05/27/19      PT SHORT TERM GOAL #2   Title  Pt will improve BERG balance to 56/56    Time  4    Period  Weeks    Status  Achieved    Target Date  05/27/19      PT SHORT TERM GOAL #3   Title  Pt will improve FGA to 28/30    Time  4    Period  Weeks    Status  Achieved    Target Date  05/27/19        PT Long Term Goals - 06/05/19 2014      PT LONG TERM GOAL #1   Title  Pt will demonstrate compliance with land and aquatic HEP  (06/26/19 date for all LTG)    Time  8    Period  Weeks    Status  New      PT LONG TERM GOAL #2   Title  Pt will improve FGA to 30/30 to indicate low falls risk    Time  8    Period  Weeks    Status  Achieved      PT LONG TERM GOAL #3   Title  Pt will demonstrate ability to transfer safely floor <> stand MOD I multiple  times    Time  8    Period  Weeks    Status  New      PT LONG TERM GOAL #4   Title  Pt will demonstrate ability to catch/throw softball and jog in grass x 200' without LOB    Time  8    Period  Weeks    Status  New      PT LONG TERM GOAL #5   Title  Pt will demonstrate improved use of VOR as indicated by decrease in DVA to 3-4 line difference    Baseline  6 line difference    Time  8    Period  Weeks    Status  New            Plan - 06/16/19 1213    Clinical Impression Statement  Continued to address gaze stability impairments and balance impairments with progression of x1 viewing on compliant surface and performing softball drills on grass.  Pt had greater difficulty with knee and ankle instability on uneven grass.  Will continue to address and progress towards LTG.    Personal Factors and Comorbidities  Comorbidity 1    Comorbidities  ADHD    Examination-Activity Limitations  Locomotion Level;Stairs    Examination-Participation Restrictions  Community Activity;Driving    Stability/Clinical Decision Making  Stable/Uncomplicated    Rehab Potential  Excellent    PT Frequency  2x / week    PT Duration  8 weeks    PT Treatment/Interventions  ADLs/Self Care Home Management;Aquatic Therapy;Electrical Stimulation;Gait training;Stair training;Functional mobility training;Therapeutic activities;Therapeutic exercise;Balance training;Neuromuscular re-education;Patient/family education;Orthotic Fit/Training    PT Next Visit Plan  check LTG and recert; add more visits.  Balance on compliant surfaces and more plyometric/dynamic movement - jump, jog, agility ladder.  Progress VOR training and gaze stability on moving  targets.  Softball outside on grass.  Trial Bioness for strengthening.    Consulted and Agree with Plan of Care  Patient       Patient will benefit from skilled therapeutic intervention in order to improve the following deficits and impairments:  Abnormal gait, Decreased  balance, Decreased coordination, Decreased strength, Difficulty walking, Impaired tone  Visit Diagnosis: Other symptoms and signs involving the nervous system  Unsteadiness on feet  Muscle weakness (generalized)  Other lack of coordination  Other abnormalities of gait and mobility  Repeated falls     Problem List Patient Active Problem List   Diagnosis Date Noted  . High risk medication use 05/03/2019  . Vitamin D deficiency 05/03/2019  . Depression with anxiety 05/03/2019  . Class 1 obesity due to excess calories without serious comorbidity with body mass index (BMI) of 31.0 to 31.9 in adult   . Neurogenic bowel   . Hypokalemia   . Neurogenic bladder 04/18/2019  . Demyelinating disorder (HCC) 04/12/2019  . ADHD 04/12/2019  . Multiple sclerosis (HCC) 04/10/2019  . Tobacco abuse 04/10/2019    Dierdre Highman, PT, DPT 06/16/19    12:19 PM    [facility] [address] [place], [place], [address] Phone: [phone]   Fax:  [phone]  Name: JEANEAN HOLLETT MRN: [id number] Date of Birth: 16-Apr-2000

## 2019-06-19 ENCOUNTER — Ambulatory Visit: Payer: BC Managed Care – PPO | Admitting: Physical Therapy

## 2019-06-21 ENCOUNTER — Encounter: Payer: Self-pay | Admitting: Physical Therapy

## 2019-06-21 ENCOUNTER — Ambulatory Visit: Payer: BC Managed Care – PPO | Admitting: Occupational Therapy

## 2019-06-21 ENCOUNTER — Ambulatory Visit: Payer: BC Managed Care – PPO | Admitting: Physical Therapy

## 2019-06-21 ENCOUNTER — Ambulatory Visit: Payer: BC Managed Care – PPO

## 2019-06-21 ENCOUNTER — Other Ambulatory Visit: Payer: Self-pay

## 2019-06-21 ENCOUNTER — Encounter: Payer: Self-pay | Admitting: Occupational Therapy

## 2019-06-21 DIAGNOSIS — R2681 Unsteadiness on feet: Secondary | ICD-10-CM

## 2019-06-21 DIAGNOSIS — R278 Other lack of coordination: Secondary | ICD-10-CM

## 2019-06-21 DIAGNOSIS — R41840 Attention and concentration deficit: Secondary | ICD-10-CM

## 2019-06-21 DIAGNOSIS — R41844 Frontal lobe and executive function deficit: Secondary | ICD-10-CM

## 2019-06-21 DIAGNOSIS — R29818 Other symptoms and signs involving the nervous system: Secondary | ICD-10-CM

## 2019-06-21 DIAGNOSIS — M6281 Muscle weakness (generalized): Secondary | ICD-10-CM | POA: Diagnosis not present

## 2019-06-21 DIAGNOSIS — R471 Dysarthria and anarthria: Secondary | ICD-10-CM | POA: Diagnosis not present

## 2019-06-21 DIAGNOSIS — R2689 Other abnormalities of gait and mobility: Secondary | ICD-10-CM

## 2019-06-21 DIAGNOSIS — R296 Repeated falls: Secondary | ICD-10-CM | POA: Diagnosis not present

## 2019-06-21 NOTE — Patient Instructions (Addendum)
 *    Rotate 2 golf balls in left hand-- both directions *  "Juggle" 2 balls *  Flip card between each finger

## 2019-06-21 NOTE — Therapy (Signed)
[facility] [address] [place], [place], [address] Phone: [phone]   Fax:  [phone]  Occupational Therapy Treatment  Patient Details  Name: Jean Carlson MRN: [id number] Date of Birth: 20-Jul-2000 Referring Provider (OT): Dr. Dagoberto Ligas   Encounter Date: 06/21/2019  OT End of Session - 06/21/19 0852    Visit Number  9    Number of Visits  17    Date for OT Re-Evaluation  07/14/19   extended due to missed visits   Authorization Type  BCBS    Authorization Time Period  week 5/8    OT Start Time  0850    OT Stop Time  0930    OT Time Calculation (min)  40 min    Activity Tolerance  Patient tolerated treatment well    Behavior During Therapy  Impulsive       Past Medical History:  Diagnosis Date  . ADHD     Past Surgical History:  Procedure Laterality Date  . WISDOM TOOTH EXTRACTION     x4    There were no vitals filed for this visit.  Subjective Assessment - 06/21/19 0851    Subjective   Denies pain    Pertinent History  demylenating disease/ MS, ADHD, Depression, tobacco use    Patient Stated Goals  improve left hand coordination    Currently in Pain?  No/denies         Ambulating and tossing ball between hands while preforming category generation with min difficulty/cues and pauses for divided attention.  Checked grip strength, coordination--see goals section below and discussed progress.  Pulling bag in palm using fingers for incr coordination (vision occluded) with min difficulty.  Cautioned pt to avoid over-fatigue when swimming (deep water) and to "anticipate fatigue" instead of waiting until she feels tired for incr safety.      OT Education - 06/21/19 0912    Education Details  Additional coordination exercises--see pt instructions    Person(s) Educated  Patient    Methods  Explanation;Verbal cues;Demonstration    Comprehension  Verbalized understanding;Returned demonstration        OT Short Term Goals - 06/21/19 0902      OT SHORT TERM GOAL #1   Title  I with HEP for coordination and UE strength-06/01/19    Time  4    Period  Weeks    Status  Achieved   ongoing, met for coordination, strengthening HEP just issued.  06/21/19  met   Target Date  06/01/19      OT SHORT TERM GOAL #2   Title  Pt will verbalize understanding of energy conservation techniques    Time  4    Period  Weeks    Status  Achieved   06/14/19     OT SHORT TERM GOAL #3   Title  Pt will verbalize understanding of compensatory strategies for short term memory and cognitive deficits.    Time  4    Period  Weeks    Status  Achieved      OT SHORT TERM GOAL #4   Title  Pt will verbalize understanding of benefits of a routine/ daily schedule and utilize modified independently    Time  4    Period  Weeks    Status  Achieved   Pt reports she is still working on getting on a regular schedule.  06/21/19     OT SHORT TERM GOAL #5   Title  Pt will increase  LUE grip strength by 5 lbs for improved LUE functional use during ADLs.    Baseline  RUE 76.2, LUE 56.2    Time  4    Period  Weeks    Status  Achieved   57.3 lbs.  06/21/19:  61lbs       OT Long Term Goals - 06/21/19 0906      OT LONG TERM GOAL #1   Title  Pt will demonstrate ability to perform a cognitive task and physical task simultaneously with 90% or better accuracy in prep for driving.    Time  8    Period  Weeks    Status  On-going      OT LONG TERM GOAL #2   Title  Pt will demonstrate improved fine motor coordination for ADLS as evidenced by decreasing 9 hole peg test score to 35 secs or less    Baseline  RUE 27.85, LUE 39.19    Time  8    Period  Weeks    Status  Achieved   06/21/19:  33.94sec     OT LONG TERM GOAL #3   Title  Pt will demonstrate improved LUE functional use for ADLs as evidenced by increasing box/ blocks score to 48 blocks or greater.    Baseline  RUE 53, LUE 43    Time  8    Period  Weeks    Status   On-going   06/21/19:  43 blocks     OT LONG TERM GOAL #4   Title  Pt will verbalize understanding of MS related resources    Time  8    Period  Weeks    Status  Achieved      OT LONG TERM GOAL #5   Title  Pt will perform simple stove top cooking task modified I demonstrating good safety awareness.    Time  4    Period  Weeks    Status  On-going            Plan - 06/21/19 3338    Clinical Impression Statement  Pt continues to progress towards goals with improving strength and coordination.    OT Occupational Profile and History  Detailed Assessment- Review of Records and additional review of physical, cognitive, psychosocial history related to current functional performance    Occupational performance deficits (Please refer to evaluation for details):  ADL's;IADL's;Leisure;Social Participation;Education    Body Structure / Function / Physical Skills  ADL;Strength;Mobility;Balance;UE functional use;FMC;Coordination;Gait;GMC;Decreased knowledge of precautions;Decreased knowledge of use of DME;Dexterity;IADL;Sensation    Cognitive Skills  Attention;Energy/Drive;Memory;Safety Awareness;Sequencing;Thought    Rehab Potential  Good    Clinical Decision Making  Limited treatment options, no task modification necessary    Comorbidities Affecting Occupational Performance:  May have comorbidities impacting occupational performance    Modification or Assistance to Complete Evaluation   No modification of tasks or assist necessary to complete eval    OT Frequency  2x / week    OT Duration  8 weeks    OT Treatment/Interventions  Self-care/ADL training;Therapeutic exercise;Functional Mobility Training;Balance training;Manual Therapy;Neuromuscular education;Aquatic Therapy;Energy conservation;Therapeutic activities;Cognitive remediation/compensation;Cryotherapy;Paraffin;Visual/perceptual remediation/compensation;Fluidtherapy;Moist Heat;Passive range of motion;Patient/family education    Plan  simple  cooking task, divided attention, coordination, functional reach    Consulted and Agree with Plan of Care  Patient       Patient will benefit from skilled therapeutic intervention in order to improve the following deficits and impairments:   Body Structure / Function / Physical Skills: ADL, Strength, Mobility, Balance,  UE functional use, [place], Coordination, Gait, GMC, Decreased knowledge of precautions, Decreased knowledge of use of DME, Dexterity, IADL, Sensation Cognitive Skills: Attention, Energy/Drive, Memory, Safety Awareness, Sequencing, Thought     Visit Diagnosis: Other symptoms and signs involving the nervous system  Muscle weakness (generalized)  Other lack of coordination  Attention and concentration deficit  Frontal lobe and executive function deficit  Other abnormalities of gait and mobility  Unsteadiness on feet    Problem List Patient Active Problem List   Diagnosis Date Noted  . High risk medication use 05/03/2019  . Vitamin D deficiency 05/03/2019  . Depression with anxiety 05/03/2019  . Class 1 obesity due to excess calories without serious comorbidity with body mass index (BMI) of 31.0 to 31.9 in adult   . Neurogenic bowel   . Hypokalemia   . Neurogenic bladder 04/18/2019  . Demyelinating disorder ([place]) 04/12/2019  . ADHD 04/12/2019  . Multiple sclerosis ([place]) 04/10/2019  . Tobacco abuse 04/10/2019    [facility] 06/21/2019, 12:23 PM  [facility] [address] [place], [place], [address] Phone: [phone]   Fax:  [phone]  Name: Jean Carlson MRN: [id number] Date of Birth: 2000-05-22   Vianne Bulls, OTR/L [facility] [address] [place], [place]  [address] [phone] phone [phone] 06/21/19 12:23 PM

## 2019-06-21 NOTE — Therapy (Signed)
[facility] [address] [place], [place], [address] Phone: [phone]   Fax:  [phone]  Physical Therapy Treatment  Patient Details  Name: Jean Carlson MRN: [id number] Date of Birth: Feb 10, 2000 Referring Provider (PT): Cathlyn Parsons, PA-C   Encounter Date: 06/21/2019  PT End of Session - 06/21/19 1015    Visit Number  15    Number of Visits  17    Date for PT Re-Evaluation  06/26/19    Authorization Type  BCBS    PT Start Time  0933    PT Stop Time  1015    PT Time Calculation (min)  42 min    Activity Tolerance  Patient tolerated treatment well    Behavior During Therapy  [facility] for tasks assessed/performed       Past Medical History:  Diagnosis Date  . ADHD     Past Surgical History:  Procedure Laterality Date  . WISDOM TOOTH EXTRACTION     x4    There were no vitals filed for this visit.  Subjective Assessment - 06/21/19 0939    Subjective  Dr. Felecia Shelling has not cleared her for interstate driving. More fatigued today; only got 6 hours of sleep last night and usually sleeps 10-12 hours.  Hands are no longer tingling.  Did not get ASO for ankles.    Patient is accompained by:  --   friend   Pertinent History  ADHD    Patient Stated Goals  Play softball, play with the dog    Currently in Pain?  No/denies         [facility] PT Assessment - 06/21/19 0942      Assessment   Medical Diagnosis  MS/ dymelenating disease    Referring Provider (PT)  Cathlyn Parsons, PA-C    Onset Date/Surgical Date  04/20/19    Hand Dominance  Right    Next MD Visit  next week    Prior Therapy  [facility]      Precautions   Precautions  Other (comment)    Precaution Comments  ADHD      Prior Function   Level of Independence  Independent      Observation/Other Assessments   Focus on Therapeutic Outcomes (FOTO)   N/A      Transfers   Transfers  Floor to Transfer    Floor to Transfer  6: Modified independent  (Device/Increase time);5: Supervision    Floor to Transfer Details (indicate cue type and reason)  when coming through tall and half kneeling pt able to perform with RLE forwards MOD I with mild instability.  When leading with LLE pt demonstrated greater instability      Functional Gait  Assessment   Gait assessed   Yes    Gait Level Surface  Walks 20 ft in less than 5.5 sec, no assistive devices, good speed, no evidence for imbalance, normal gait pattern, deviates no more than 6 in outside of the 12 in walkway width.    Change in Gait Speed  Able to smoothly change walking speed without loss of balance or gait deviation. Deviate no more than 6 in outside of the 12 in walkway width.    Gait with Horizontal Head Turns  Performs head turns smoothly with no change in gait. Deviates no more than 6 in outside 12 in walkway width    Gait with Vertical Head Turns  Performs head turns with no change in gait. Deviates no more than  6 in outside 12 in walkway width.    Gait and Pivot Turn  Pivot turns safely within 3 sec and stops quickly with no loss of balance.    Step Over Obstacle  Is able to step over 2 stacked shoe boxes taped together (9 in total height) without changing gait speed. No evidence of imbalance.    Gait with Narrow Base of Support  Is able to ambulate for 10 steps heel to toe with no staggering.    Gait with Eyes Closed  Walks 20 ft, no assistive devices, good speed, no evidence of imbalance, normal gait pattern, deviates no more than 6 in outside 12 in walkway width. Ambulates 20 ft in less than 7 sec.    Ambulating Backwards  Walks 20 ft, no assistive devices, good speed, no evidence for imbalance, normal gait    Steps  Alternating feet, no rail.    Total Score  30    FGA comment:  30/30          Vestibular Assessment - 06/21/19 0954      Visual Acuity   Static  10    Dynamic  4   6 line difference; no change from April                      PT Education -  06/21/19 1014    Education Details  progress towards goals and areas to continue to work on    [employer]) Educated  Patient    Methods  Explanation    Comprehension  Verbalized understanding       PT Short Term Goals - 06/05/19 2014      PT SHORT TERM GOAL #1   Title  Pt will initiate and demonstrate compliance with land based HEP.    Time  4    Period  Weeks    Status  Achieved    Target Date  05/27/19      PT SHORT TERM GOAL #2   Title  Pt will improve BERG balance to 56/56    Time  4    Period  Weeks    Status  Achieved    Target Date  05/27/19      PT SHORT TERM GOAL #3   Title  Pt will improve FGA to 28/30    Time  4    Period  Weeks    Status  Achieved    Target Date  05/27/19        PT Long Term Goals - 06/21/19 0955      PT LONG TERM GOAL #1   Title  Pt will demonstrate compliance with land and aquatic HEP  (06/26/19 date for all LTG)    Time  8    Period  Weeks    Status  Achieved      PT LONG TERM GOAL #2   Title  Pt will improve FGA to 30/30 to indicate low falls risk    Time  8    Period  Weeks    Status  Achieved      PT LONG TERM GOAL #3   Title  Pt will demonstrate ability to transfer safely floor <> stand MOD I multiple times    Time  8    Period  Weeks    Status  Partially Met      PT LONG TERM GOAL #4   Title  Pt will demonstrate ability to catch/throw softball and jog in grass  x 200' without LOB    Time  8    Period  Weeks    Status  On-going      PT LONG TERM GOAL #5   Title  Pt will demonstrate improved use of VOR as indicated by decrease in DVA to 3-4 line difference    Baseline  6 line difference - same    Time  8    Period  Weeks    Status  Not Met      New goals for recertification: PT Short Term Goals - 06/21/19 1352      PT SHORT TERM GOAL #1   Title  = LTG      PT Long Term Goals - 06/21/19 1352      PT LONG TERM GOAL #1   Title  Pt will demonstrate compliance with final HEP    Time  8    Period  Weeks     Status  New    Target Date  08/20/19      PT LONG TERM GOAL #2   Title  Pt will demonstrate ability to safely stand lower self to floor and stand from floor leading with LLE MOD I    Time  8    Period  Weeks    Status  New    Target Date  08/20/19      PT LONG TERM GOAL #3   Title  Pt will demonstrate <5 errors on BESS to indicate improved balance    Baseline  TBD    Time  8    Period  Weeks    Status  New    Target Date  08/20/19      PT LONG TERM GOAL #4   Title  Pt will demonstrate ability to catch/throw softball and jog in grass x 200' without LOB    Time  8    Period  Weeks    Status  New    Target Date  08/20/19      PT LONG TERM GOAL #5   Title  Pt will demonstrate improved use of VOR as indicated by decrease in DVA to 3-4 line difference    Baseline  6 line difference - same    Time  8    Period  Weeks    Status  Revised    Target Date  08/20/19            Plan - 06/21/19 1344    Clinical Impression Statement  Pt is making steady progress towards LTG, she has completed aquatic therapy and has transitioned to land therapy.  She has met 2/5 LTG and demonstrates improved balance and decreased falls risk during more dynamic gait.  Pt partially met floor > stand goal - pt able to stand from RLE MOD I but requires close supervision when leading with LLE due to instability.  Softball goal is ongoing as pt continues to demonstrate imbalance on uneven surfaces (partly due to premorbid ankle instability) and difficulty with gaze stability.  VOR continues to be significantly impaired and has not demonstrated any change on DVA.  Pt will benefit from ongoing skilled PT services to address these impairments and to continue to progress functional mobility independence and decrease falls risk.    Personal Factors and Comorbidities  Comorbidity 1    Comorbidities  ADHD    Examination-Activity Limitations  Locomotion Level;Stairs    Examination-Participation Restrictions  Community  Activity;Driving    Rehab Potential  Excellent  PT Frequency  1x / week    PT Duration  6 weeks    PT Treatment/Interventions  ADLs/Self Care Home Management;Aquatic Therapy;Electrical Stimulation;Gait training;Stair training;Functional mobility training;Therapeutic activities;Therapeutic exercise;Balance training;Neuromuscular re-education;Patient/family education;Orthotic Fit/Training;Energy conservation;Vestibular;Visual/perceptual remediation/compensation    PT Next Visit Plan  Perform BESS for higher level balance assessment.  Balance on compliant surfaces and more plyometric/dynamic movement - jump, jog, agility ladder.  Progress VOR training and gaze stability on moving targets.  Softball outside on grass.  Floor > stand training on LLE    Consulted and Agree with Plan of Care  Patient       Patient will benefit from skilled therapeutic intervention in order to improve the following deficits and impairments:  Abnormal gait, Decreased balance, Decreased coordination, Decreased strength, Difficulty walking, Impaired tone, Impaired vision/preception  Visit Diagnosis: Other symptoms and signs involving the nervous system  Muscle weakness (generalized)  Other lack of coordination  Other abnormalities of gait and mobility  Unsteadiness on feet     Problem List Patient Active Problem List   Diagnosis Date Noted  . High risk medication use 05/03/2019  . Vitamin D deficiency 05/03/2019  . Depression with anxiety 05/03/2019  . Class 1 obesity due to excess calories without serious comorbidity with body mass index (BMI) of 31.0 to 31.9 in adult   . Neurogenic bowel   . Hypokalemia   . Neurogenic bladder 04/18/2019  . Demyelinating disorder ([place]) 04/12/2019  . ADHD 04/12/2019  . Multiple sclerosis ([place]) 04/10/2019  . Tobacco abuse 04/10/2019    Rico Junker, PT, DPT 06/21/19    1:51 PM    [facility] [address]  [place], [place], [address] Phone: [phone]   Fax:  [phone]  Name: Jean Carlson MRN: [id number] Date of Birth: Apr 17, 2000

## 2019-06-23 ENCOUNTER — Other Ambulatory Visit: Payer: Self-pay

## 2019-06-23 ENCOUNTER — Encounter: Payer: Self-pay | Admitting: Occupational Therapy

## 2019-06-23 ENCOUNTER — Ambulatory Visit: Payer: BC Managed Care – PPO | Admitting: Occupational Therapy

## 2019-06-23 DIAGNOSIS — R41840 Attention and concentration deficit: Secondary | ICD-10-CM | POA: Diagnosis not present

## 2019-06-23 DIAGNOSIS — R296 Repeated falls: Secondary | ICD-10-CM | POA: Diagnosis not present

## 2019-06-23 DIAGNOSIS — R29818 Other symptoms and signs involving the nervous system: Secondary | ICD-10-CM

## 2019-06-23 DIAGNOSIS — R278 Other lack of coordination: Secondary | ICD-10-CM

## 2019-06-23 DIAGNOSIS — R2689 Other abnormalities of gait and mobility: Secondary | ICD-10-CM

## 2019-06-23 DIAGNOSIS — R2681 Unsteadiness on feet: Secondary | ICD-10-CM

## 2019-06-23 DIAGNOSIS — R41844 Frontal lobe and executive function deficit: Secondary | ICD-10-CM

## 2019-06-23 DIAGNOSIS — M6281 Muscle weakness (generalized): Secondary | ICD-10-CM

## 2019-06-23 DIAGNOSIS — R471 Dysarthria and anarthria: Secondary | ICD-10-CM | POA: Diagnosis not present

## 2019-06-23 NOTE — Therapy (Signed)
[facility] [address] [place], [place], [address] Phone: [phone]   Fax:  [phone]  Occupational Therapy Treatment  Patient Details  Name: Jean Carlson MRN: [id number] Date of Birth: 02/13/2000 Referring Provider (OT): Dr. Lovorn   Encounter Date: 06/23/2019  OT End of Session - 06/23/19 1028    Visit Number  10    Number of Visits  17    Date for OT Re-Evaluation  07/14/19   extended due to missed visits   Authorization Type  BCBS    Authorization Time Period  week 5/8    OT Start Time  1023    OT Stop Time  1103    OT Time Calculation (min)  40 min    Activity Tolerance  Patient tolerated treatment well    Behavior During Therapy  Impulsive       Past Medical History:  Diagnosis Date  . ADHD     Past Surgical History:  Procedure Laterality Date  . WISDOM TOOTH EXTRACTION     x4    There were no vitals filed for this visit.  Subjective Assessment - 06/23/19 1026    Subjective   Pt reports feeling sore/bruising due to falling off bed and hitting dresser    Pertinent History  demylenating disease/ MS, ADHD, Depression, tobacco use    Patient Stated Goals  improve left hand coordination    Currently in Pain?  Yes    Pain Score  2     Pain Location  Hip    Pain Orientation  Left    Pain Descriptors / Indicators  Aching;Sore    Pain Type  Acute pain    Pain Onset  In the past 7 days    Pain Frequency  Intermittent    Aggravating Factors   touch, movement    Pain Relieving Factors  rest         Simple cooking task (scrambled eggs):  Pt gathered needed items and cooked with good safety, mild impulsivity noted.  Pt able to wash dishes after, mild ataxia noted LUE.  Placing O'connor pegs in pegboard with tweezers with min difficulty for incr coordination L hand.  Arm bike x 6min level 5 without rest, forward/backwards for conditioning.       OT Short Term Goals - 06/21/19 0902       OT SHORT TERM GOAL #1   Title  I with HEP for coordination and UE strength-06/01/19    Time  4    Period  Weeks    Status  Achieved   ongoing, met for coordination, strengthening HEP just issued.  06/21/19  met   Target Date  06/01/19      OT SHORT TERM GOAL #2   Title  Pt will verbalize understanding of energy conservation techniques    Time  4    Period  Weeks    Status  Achieved   06/14/19     OT SHORT TERM GOAL #3   Title  Pt will verbalize understanding of compensatory strategies for short term memory and cognitive deficits.    Time  4    Period  Weeks    Status  Achieved      OT SHORT TERM GOAL #4   Title  Pt will verbalize understanding of benefits of a routine/ daily schedule and utilize modified independently    Time  4    Period  Weeks    Status  Achieved     [facility] [address] [place], [place], [address] Phone: [phone]   Fax:  [phone]  Occupational Therapy Treatment  Patient Details  Name: Jean Carlson MRN: [id number] Date of Birth: 02/13/2000 Referring Provider (OT): Dr. Lovorn   Encounter Date: 06/23/2019  OT End of Session - 06/23/19 1028    Visit Number  10    Number of Visits  17    Date for OT Re-Evaluation  07/14/19   extended due to missed visits   Authorization Type  BCBS    Authorization Time Period  week 5/8    OT Start Time  1023    OT Stop Time  1103    OT Time Calculation (min)  40 min    Activity Tolerance  Patient tolerated treatment well    Behavior During Therapy  Impulsive       Past Medical History:  Diagnosis Date  . ADHD     Past Surgical History:  Procedure Laterality Date  . WISDOM TOOTH EXTRACTION     x4    There were no vitals filed for this visit.  Subjective Assessment - 06/23/19 1026    Subjective   Pt reports feeling sore/bruising due to falling off bed and hitting dresser    Pertinent History  demylenating disease/ MS, ADHD, Depression, tobacco use    Patient Stated Goals  improve left hand coordination    Currently in Pain?  Yes    Pain Score  2     Pain Location  Hip    Pain Orientation  Left    Pain Descriptors / Indicators  Aching;Sore    Pain Type  Acute pain    Pain Onset  In the past 7 days    Pain Frequency  Intermittent    Aggravating Factors   touch, movement    Pain Relieving Factors  rest         Simple cooking task (scrambled eggs):  Pt gathered needed items and cooked with good safety, mild impulsivity noted.  Pt able to wash dishes after, mild ataxia noted LUE.  Placing O'connor pegs in pegboard with tweezers with min difficulty for incr coordination L hand.  Arm bike x 6min level 5 without rest, forward/backwards for conditioning.       OT Short Term Goals - 06/21/19 0902       OT SHORT TERM GOAL #1   Title  I with HEP for coordination and UE strength-06/01/19    Time  4    Period  Weeks    Status  Achieved   ongoing, met for coordination, strengthening HEP just issued.  06/21/19  met   Target Date  06/01/19      OT SHORT TERM GOAL #2   Title  Pt will verbalize understanding of energy conservation techniques    Time  4    Period  Weeks    Status  Achieved   06/14/19     OT SHORT TERM GOAL #3   Title  Pt will verbalize understanding of compensatory strategies for short term memory and cognitive deficits.    Time  4    Period  Weeks    Status  Achieved      OT SHORT TERM GOAL #4   Title  Pt will verbalize understanding of benefits of a routine/ daily schedule and utilize modified independently    Time  4    Period  Weeks    Status  Achieved     Consulted and Agree with Plan of Care  Patient       Patient will benefit from skilled therapeutic intervention in order to improve the following deficits and impairments:   Body Structure / Function / Physical Skills: ADL, Strength, Mobility, Balance, UE functional use, FMC, Coordination, Gait, GMC, Decreased knowledge of precautions, Decreased knowledge of use of DME, Dexterity, IADL, Sensation Cognitive Skills: Attention, Energy/Drive, Memory, Safety Awareness, Sequencing, Thought     Visit Diagnosis: Other symptoms and signs involving the nervous system  Muscle weakness (generalized)  Other lack of coordination  Other abnormalities of gait and mobility  Unsteadiness on feet  Frontal lobe and executive function deficit  Attention and concentration deficit    Problem List Patient Active Problem List   Diagnosis Date Noted  . High risk medication use 05/03/2019  . Vitamin D deficiency 05/03/2019  . Depression with anxiety 05/03/2019  . Class 1 obesity due to excess calories without serious comorbidity with body mass index (BMI) of 31.0 to 31.9 in adult   . Neurogenic bowel   . Hypokalemia   . Neurogenic bladder 04/18/2019  . Demyelinating disorder ([place]) 04/12/2019  . ADHD 04/12/2019  . Multiple sclerosis ([place]) 04/10/2019  . Tobacco abuse 04/10/2019    [facility] 06/23/2019, 12:21 PM  [facility] [address] [place], [place], [address] Phone: [phone]   Fax:  [phone]  Name: Jean Carlson MRN: [id number] Date of Birth: Dec 19, 2000   Vianne Bulls, OTR/L [facility] [address] [place], [place]  [address] [phone] phone [phone] 06/23/19 12:21 PM

## 2019-06-28 DIAGNOSIS — R350 Frequency of micturition: Secondary | ICD-10-CM | POA: Diagnosis not present

## 2019-06-28 DIAGNOSIS — R351 Nocturia: Secondary | ICD-10-CM | POA: Diagnosis not present

## 2019-06-28 DIAGNOSIS — N3941 Urge incontinence: Secondary | ICD-10-CM | POA: Diagnosis not present

## 2019-06-29 ENCOUNTER — Ambulatory Visit: Payer: BC Managed Care – PPO

## 2019-06-29 ENCOUNTER — Other Ambulatory Visit: Payer: Self-pay

## 2019-06-29 ENCOUNTER — Ambulatory Visit: Payer: BC Managed Care – PPO | Attending: Physician Assistant | Admitting: Occupational Therapy

## 2019-06-29 DIAGNOSIS — R29818 Other symptoms and signs involving the nervous system: Secondary | ICD-10-CM

## 2019-06-29 DIAGNOSIS — R2681 Unsteadiness on feet: Secondary | ICD-10-CM

## 2019-06-29 DIAGNOSIS — R41844 Frontal lobe and executive function deficit: Secondary | ICD-10-CM

## 2019-06-29 DIAGNOSIS — R278 Other lack of coordination: Secondary | ICD-10-CM | POA: Diagnosis not present

## 2019-06-29 DIAGNOSIS — G35 Multiple sclerosis: Secondary | ICD-10-CM | POA: Diagnosis not present

## 2019-06-29 DIAGNOSIS — R2689 Other abnormalities of gait and mobility: Secondary | ICD-10-CM

## 2019-06-29 DIAGNOSIS — M6281 Muscle weakness (generalized): Secondary | ICD-10-CM | POA: Insufficient documentation

## 2019-06-29 DIAGNOSIS — R41840 Attention and concentration deficit: Secondary | ICD-10-CM | POA: Diagnosis not present

## 2019-06-29 NOTE — Therapy (Signed)
[facility] [address] [place], [place], [address] Phone: [phone]   Fax:  [phone]  Physical Therapy Treatment  Patient Details  Name: Jean Carlson MRN: [id number] Date of Birth: 14-Feb-2000 Referring Provider (PT): Charlton Amor, PA-C   Encounter Date: 06/29/2019  PT End of Session - 06/29/19 1249    Visit Number  16    Number of Visits  23    Date for PT Re-Evaluation  08/20/19    Authorization Type  BCBS    PT Start Time  1013    PT Stop Time  1057    PT Time Calculation (min)  44 min    Equipment Utilized During Treatment  Gait belt    Activity Tolerance  Patient tolerated treatment well    Behavior During Therapy  Impulsive       Past Medical History:  Diagnosis Date  . ADHD     Past Surgical History:  Procedure Laterality Date  . WISDOM TOOTH EXTRACTION     x4    There were no vitals filed for this visit.  Subjective Assessment - 06/29/19 1016    Subjective  Patient reports being feeling well. Reports did not get enough sleep last night, reports got 7 hours. Reports brain would not turn off.    Patient is accompained by:  --   friend   Pertinent History  ADHD    Patient Stated Goals  Play softball, play with the dog    Currently in Pain?  No/denies                        [facility] Adult PT Treatment/Exercise - 06/29/19 1055      Ambulation/Gait   Ambulation/Gait  Yes    Ambulation/Gait Assistance  6: Modified independent (Device/Increase time)    Ambulation Distance (Feet)  200 Feet    Assistive device  None    Gait Pattern  Step-through pattern;Decreased stance time - left;Right genu recurvatum;Left genu recurvatum    Ambulation Surface  Level;Indoor    Gait Comments  around therapy gym during session with completion of activities.       High Level Balance   High Level Balance Activities  Tandem walking    High Level Balance Comments  Completed tandem walking  on blue mat x 6 laps along countertop w/ intermittent UE support.       Neuro Re-ed    Neuro Re-ed Details   Completed BESS: Patient total score with 44/60. Non Dominant foot Left. Patient unable to hold proper test position on foam with SLS and Tandem Stance. Completed high level plyometrics with agility ladder, including forward jumping x 4 laps, two feet in two feet out laterall x 4 laps, and hot in/hop out x 4 laps. Patietn requiring CGA at times for steadying with completion of activities.       Vestibular Treatment/Exercise - 06/29/19 0001      Vestibular Treatment/Exercise   Gaze Exercises  X1 Viewing Horizontal;X2 Viewing Horizontal;X1 Viewing Vertical      X1 Viewing Horizontal   Foot Position  completed standing feet together, completed standing feet apart on foam    Reps  2    Comments  2 x 60 secs each, mild symptoms with standing. Increased difficulty with complaint surfaces.       X1 Viewing Vertical   Foot Position  completed standing feet together on firm surface    Reps  2  Comments  60 seconds, no symptoms noted.       X2 Viewing Horizontal   Foot Position  seated edge of mat w/o back support    Reps  2     Comments  x 60 secs, increased difficulty with coordination, mild symptoms              PT Short Term Goals - 06/21/19 1352      PT SHORT TERM GOAL #1   Title  = LTG        PT Long Term Goals - 06/21/19 1352      PT LONG TERM GOAL #1   Title  Pt will demonstrate compliance with final HEP    Time  8    Period  Weeks    Status  New    Target Date  08/20/19      PT LONG TERM GOAL #2   Title  Pt will demonstrate ability to safely stand lower self to floor and stand from floor leading with LLE MOD I    Time  8    Period  Weeks    Status  New    Target Date  08/20/19      PT LONG TERM GOAL #3   Title  Pt will demonstrate <5 errors on BESS to indicate improved balance    Baseline  TBD    Time  8    Period  Weeks    Status  New    Target  Date  08/20/19      PT LONG TERM GOAL #4   Title  Pt will demonstrate ability to catch/throw softball and jog in grass x 200' without LOB    Time  8    Period  Weeks    Status  New    Target Date  08/20/19      PT LONG TERM GOAL #5   Title  Pt will demonstrate improved use of VOR as indicated by decrease in DVA to 3-4 line difference    Baseline  6 line difference - same    Time  8    Period  Weeks    Status  Revised    Target Date  08/20/19            Plan - 06/29/19 1250    Clinical Impression Statement  Today's skilled PT session included completion of BESS, with patient scoring 44/60 demonstrating decreased high level balance at this time. Completed high level agility activities with agility ladder, patient able to complete all activities with intermittent CGA. Progressed VOR as tolerated by patient, pt still demo dififuclty with completion on complaint surfaces. Pt will continue to benefit from skilled PT services to address deficits and progress toward all unmet goals.    Personal Factors and Comorbidities  Comorbidity 1    Comorbidities  ADHD    Examination-Activity Limitations  Locomotion Level;Stairs    Examination-Participation Restrictions  Community Activity;Driving    Rehab Potential  Excellent    PT Frequency  1x / week    PT Duration  8 weeks    PT Treatment/Interventions  ADLs/Self Care Home Management;Aquatic Therapy;Electrical Stimulation;Gait training;Stair training;Functional mobility training;Therapeutic activities;Therapeutic exercise;Balance training;Neuromuscular re-education;Patient/family education;Orthotic Fit/Training;Energy conservation;Vestibular;Visual/perceptual remediation/compensation    PT Next Visit Plan  Continue balance on compliant surfaces and more plyometric/dynamic movement - jump, jog, agility ladder.  Progress VOR training and gaze stability on moving targets.  Softball outside on grass.  Floor > stand training on LLE  Consulted and  Agree with Plan of Care  Patient       Patient will benefit from skilled therapeutic intervention in order to improve the following deficits and impairments:  Abnormal gait, Decreased balance, Decreased coordination, Decreased strength, Difficulty walking, Impaired tone, Impaired vision/preception  Visit Diagnosis: Muscle weakness (generalized)  Other lack of coordination  Other abnormalities of gait and mobility  Other symptoms and signs involving the nervous system  Unsteadiness on feet     Problem List Patient Active Problem List   Diagnosis Date Noted  . High risk medication use 05/03/2019  . Vitamin D deficiency 05/03/2019  . Depression with anxiety 05/03/2019  . Class 1 obesity due to excess calories without serious comorbidity with body mass index (BMI) of 31.0 to 31.9 in adult   . Neurogenic bowel   . Hypokalemia   . Neurogenic bladder 04/18/2019  . Demyelinating disorder ([facility]) 04/12/2019  . ADHD 04/12/2019  . Multiple sclerosis ([facility]) 04/10/2019  . Tobacco abuse 04/10/2019    Tempie Donning, PT, DPT 06/29/2019, 12:54 PM  [facility] [address] [place], [place], [address] Phone: [phone]   Fax:  [phone]  Name: Jean Carlson MRN: [id number] Date of Birth: 01/18/2001

## 2019-06-29 NOTE — Therapy (Signed)
coordination  Other abnormalities of gait and mobility  Other symptoms and signs involving the nervous system   OCCUPATIONAL THERAPY DISCHARGE SUMMARY    Current functional level related to goals / functional outcomes: Pt made good overall progress. See goals for progress. Pt did not fully achieve multi tasking goal when it required visual scanning, however she demonstrates good performance with basic multi tasking   Remaining deficits: Mild cognitive deficits,    Education / Equipment: Pt was educated in HEP, and safety for IADLs. Pt agrees with plans for d/c .  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 Therapist recommends that pt practices with family initially if cleared by MD.  Plan: Patient agrees to discharge.  Patient goals were partially met. Patient is being discharged due to being pleased with the current functional level.  ?????      Problem List Patient Active Problem List   Diagnosis Date Noted  . High risk medication use 05/03/2019  . Vitamin D deficiency 05/03/2019  . Depression with anxiety 05/03/2019  . Class 1 obesity due to excess calories without serious comorbidity with body mass index (BMI) of 31.0 to 31.9 in adult   . Neurogenic bowel   . Hypokalemia   . Neurogenic bladder 04/18/2019  . Demyelinating disorder ([place]) 04/12/2019  . ADHD 04/12/2019  . Multiple sclerosis ([place]) 04/10/2019  . Tobacco abuse 04/10/2019    Jean Carlson 06/29/2019, 11:59 AM Theone Murdoch, OTR/L [phone] Phone: [phone] 12:07 PM 06/29/19 [facility] [address] [place], [place], [address] Phone:  [phone]   Fax:  [phone]  Name: Jean Carlson MRN: [id number] Date of Birth: 2001-01-07  coordination  Other abnormalities of gait and mobility  Other symptoms and signs involving the nervous system   OCCUPATIONAL THERAPY DISCHARGE SUMMARY    Current functional level related to goals / functional outcomes: Pt made good overall progress. See goals for progress. Pt did not fully achieve multi tasking goal when it required visual scanning, however she demonstrates good performance with basic multi tasking   Remaining deficits: Mild cognitive deficits,    Education / Equipment: Pt was educated in HEP, and safety for IADLs. Pt agrees with plans for d/c .  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 Therapist recommends that pt practices with family initially if cleared by MD.  Plan: Patient agrees to discharge.  Patient goals were partially met. Patient is being discharged due to being pleased with the current functional level.  ?????      Problem List Patient Active Problem List   Diagnosis Date Noted  . High risk medication use 05/03/2019  . Vitamin D deficiency 05/03/2019  . Depression with anxiety 05/03/2019  . Class 1 obesity due to excess calories without serious comorbidity with body mass index (BMI) of 31.0 to 31.9 in adult   . Neurogenic bowel   . Hypokalemia   . Neurogenic bladder 04/18/2019  . Demyelinating disorder ([place]) 04/12/2019  . ADHD 04/12/2019  . Multiple sclerosis ([place]) 04/10/2019  . Tobacco abuse 04/10/2019    Jean Carlson 06/29/2019, 11:59 AM Theone Murdoch, OTR/L [phone] Phone: [phone] 12:07 PM 06/29/19 [facility] [facility] [address] [place], [place], [address] Phone:  [phone]   Fax:  [phone]  Name: Jean Carlson MRN: [id number] Date of Birth: 2001-01-07  [facility] [address] [place], [place], [address] Phone: [phone]   Fax:  [phone]  Occupational Therapy Treatment  Patient Details  Name: Jean Carlson MRN: [id number] Date of Birth: Jul 26, 2000 Referring Provider (OT): Dr. Dagoberto Ligas   Encounter Date: 06/29/2019  OT End of Session - 06/29/19 1002    Visit Number  11    Number of Visits  17    Date for OT Re-Evaluation  07/14/19   extended due to missed visits   Authorization Type  BCBS    Authorization Time Period  week 5/8    OT Start Time  0933    OT Stop Time  1013    OT Time Calculation (min)  40 min    Activity Tolerance  Patient tolerated treatment well    Behavior During Therapy  Impulsive       Past Medical History:  Diagnosis Date  . ADHD     Past Surgical History:  Procedure Laterality Date  . WISDOM TOOTH EXTRACTION     x4    There were no vitals filed for this visit.  Subjective Assessment - 06/29/19 1156    Pertinent History  demylenating disease/ MS, ADHD, Depression, tobacco use    Patient Stated Goals  improve left hand coordination    Currently in Pain?  No/denies    Pain Onset  In the past 7 days                 Treatment Checked progress towards remaining goals, and discussed plans for d/c. Pt is in agreement. Reviewed green theraband exercises, 15 reps each. Arm bike x 5 mins level 3 for conditioning. Ambulating while tossing ball and performing cognitive task, no LOB and good performance. Amb while tossing ball and locating items 11/13 items located first pass(85%)            OT Short Term Goals - 06/29/19 0939      OT SHORT TERM GOAL #1   Title  I with HEP for coordination and UE strength-06/01/19    Time  4    Period  Weeks    Status  Achieved   ongoing, met for coordination, strengthening HEP just issued.  06/21/19  met   Target Date  06/01/19      OT SHORT TERM GOAL #2   Title  Pt will  verbalize understanding of energy conservation techniques    Time  4    Period  Weeks    Status  Achieved   06/14/19     OT SHORT TERM GOAL #3   Title  Pt will verbalize understanding of compensatory strategies for short term memory and cognitive deficits.    Time  4    Period  Weeks    Status  Achieved      OT SHORT TERM GOAL #4   Title  Pt will verbalize understanding of benefits of a routine/ daily schedule and utilize modified independently    Time  4    Period  Weeks    Status  Achieved   Pt reports she is still working on getting on a regular schedule.  06/21/19     OT SHORT TERM GOAL #5   Title  Pt will increase LUE grip strength by 5 lbs for improved LUE functional use during ADLs.    Baseline  RUE 76.2, LUE 56.2    Time  4    Period  Weeks    Status

## 2019-07-04 ENCOUNTER — Telehealth: Payer: Self-pay | Admitting: Neurology

## 2019-07-04 DIAGNOSIS — G35 Multiple sclerosis: Secondary | ICD-10-CM | POA: Diagnosis not present

## 2019-07-04 NOTE — Telephone Encounter (Signed)
Gave message to [facility] to call pt with an update on getting scheduled for Ocrevus

## 2019-07-04 NOTE — Telephone Encounter (Signed)
Jean Carlson, pt came in a month early for her appt by accident but she is questioning when she will get the "ocrevis" she thought she had an appt 07/19/19 but it's not showing in the system. Can you reach out to this patient and advise please. Shawna Clamp [phone].

## 2019-07-05 ENCOUNTER — Ambulatory Visit: Payer: BC Managed Care – PPO | Admitting: Physical Therapy

## 2019-07-06 ENCOUNTER — Ambulatory Visit: Payer: BC Managed Care – PPO | Admitting: Physical Therapy

## 2019-07-07 ENCOUNTER — Other Ambulatory Visit: Payer: Self-pay

## 2019-07-07 ENCOUNTER — Encounter: Payer: Self-pay | Admitting: Rehabilitative and Restorative Service Providers"

## 2019-07-07 ENCOUNTER — Ambulatory Visit: Payer: BC Managed Care – PPO | Admitting: Rehabilitative and Restorative Service Providers"

## 2019-07-07 DIAGNOSIS — R41844 Frontal lobe and executive function deficit: Secondary | ICD-10-CM | POA: Diagnosis not present

## 2019-07-07 DIAGNOSIS — M6281 Muscle weakness (generalized): Secondary | ICD-10-CM | POA: Diagnosis not present

## 2019-07-07 DIAGNOSIS — R2689 Other abnormalities of gait and mobility: Secondary | ICD-10-CM

## 2019-07-07 DIAGNOSIS — R278 Other lack of coordination: Secondary | ICD-10-CM | POA: Diagnosis not present

## 2019-07-07 DIAGNOSIS — G35 Multiple sclerosis: Secondary | ICD-10-CM | POA: Diagnosis not present

## 2019-07-07 DIAGNOSIS — R41840 Attention and concentration deficit: Secondary | ICD-10-CM | POA: Diagnosis not present

## 2019-07-07 DIAGNOSIS — R2681 Unsteadiness on feet: Secondary | ICD-10-CM | POA: Diagnosis not present

## 2019-07-07 DIAGNOSIS — R29818 Other symptoms and signs involving the nervous system: Secondary | ICD-10-CM | POA: Diagnosis not present

## 2019-07-07 NOTE — Therapy (Signed)
[facility] [address] [place], [place], [address] Phone: [phone]   Fax:  [phone]  Physical Therapy Treatment  Patient Details  Name: Jean Carlson MRN: [id number] Date of Birth: [birth year] Referring Provider (PT): Cathlyn Parsons, PA-C   Encounter Date: 07/07/2019   PT End of Session - 07/07/19 1024    Visit Number 17    Number of Visits 23    Date for PT Re-Evaluation 08/20/19    Authorization Type BCBS    PT Start Time 0931    PT Stop Time 1014    PT Time Calculation (min) 43 min    Equipment Utilized During Treatment Gait belt    Activity Tolerance Patient tolerated treatment well    Behavior During Therapy Impulsive           Past Medical History:  Diagnosis Date   ADHD     Past Surgical History:  Procedure Laterality Date   WISDOM TOOTH EXTRACTION     x4    There were no vitals filed for this visit.   Subjective Assessment - 07/07/19 0929    Subjective Patient denies any changes since last sessions. Reports compliance with exercises at homes. Asks if she is able to get her OT exercises electronically. I feel dizzy when I'm spinning/turning all the way around.    Patient is accompained by: --   friend   Pertinent History ADHD    Patient Stated Goals Play softball, play with the dog    Currently in Pain? No/denies                             [facility] Adult PT Treatment/Exercise - 07/07/19 0933      Ambulation/Gait   Ambulation/Gait Yes    Ambulation/Gait Assistance 6: Modified independent (Device/Increase time)    Ambulation Distance (Feet) 300 Feet    Assistive device None    Gait Pattern Step-through pattern;Decreased stance time - left;Right genu recurvatum;Left genu recurvatum    Ambulation Surface Level;Unlevel;Indoor;Outdoor;Paved;Gravel;Grass;Other (comment)   mulch   Gait Comments around therapy gym during session with completion of activities.        High Level Balance   High Level Balance Activities Side stepping;Backward walking;Direction changes;Other (comment)   see comments   High Level Balance Comments Outdoors in mulch, grass and gravel - combination of backwards walking, 180* turns, fwd/backwards walking with "large" side steps to advance fwd/back between surfaces, side stepping, fwd/back stepping between surfaces x multiple laps requiring CGA due to frequent missteps especially when fatigued and leading with L foot with cueing to increase step length to widen BOS and improve steadiness. Benefitted from cueing for gaze fixation during 180* turns with mild improvement noted with utilization. Agility work between Therapist, art: fwd/back stepping at pace x 15 steps, 2 sets leading with each foot requiring CGA with intermittent min A when stepping backwards leading with L foot.        Neuro Re-ed    Neuro Re-ed Details  Corner balance: feet together + + 2 floor pillows + EC x 30 seconds, 2 sets; feet together + diagonals (both directions) x 30 seconds each; partial tandem on 2 floor pillows + EC x 30 seconds, 2 reps with each foot positioned posterior - more challenged with L foot positioned posteriorly requiring intermittent wall bumps throughout especially on second set of exercises.  Vestibular Treatment/Exercise - 07/07/19 0001      Vestibular Treatment/Exercise   Gaze Exercises X1 Viewing Horizontal;X1 Viewing Vertical      X1 Viewing Horizontal   Foot Position feet together on 2 floor pillows    Reps 2    Comments 2 x 30 seconds      X1 Viewing Vertical   Foot Position feet together on 2 floor pillows    Reps 2    Comments 2 x 30 seconds, challenged to maintain focus with chin down/vertical eye motion to maintain fixation on target - only noted on second set                   PT Short Term Goals - 06/21/19 1352      PT SHORT TERM GOAL #1   Title = LTG             PT Long Term Goals - 06/21/19  1352      PT LONG TERM GOAL #1   Title Pt will demonstrate compliance with final HEP    Time 8    Period Weeks    Status New    Target Date 08/20/19      PT LONG TERM GOAL #2   Title Pt will demonstrate ability to safely stand lower self to floor and stand from floor leading with LLE MOD I    Time 8    Period Weeks    Status New    Target Date 08/20/19      PT LONG TERM GOAL #3   Title Pt will demonstrate <5 errors on BESS to indicate improved balance    Baseline TBD    Time 8    Period Weeks    Status New    Target Date 08/20/19      PT LONG TERM GOAL #4   Title Pt will demonstrate ability to catch/throw softball and jog in grass x 200' without LOB    Time 8    Period Weeks    Status New    Target Date 08/20/19      PT LONG TERM GOAL #5   Title Pt will demonstrate improved use of VOR as indicated by decrease in DVA to 3-4 line difference    Baseline 6 line difference - same    Time 8    Period Weeks    Status Revised    Target Date 08/20/19                 Plan - 07/07/19 1027    Clinical Impression Statement Todays skilled session focused on progression of gaze stabilization exercises with introduction to these exercises on compliant surfaces in addition to progression of both higher level balance challenges and agility work on new, compliant, outdoor surfaces, which she tolerated well but continues to demonstrate decreased confidence and steadiness when more reliance on her LLE to complete the activity/task/exercise in addition to being challenged when placed in situations where she must be more heavily reliant on her vestibular system to maintain balance. She will benefit from continued skilled PT to maximize functional mobility.    Personal Factors and Comorbidities Comorbidity 1    Comorbidities ADHD    Examination-Activity Limitations Locomotion Level;Stairs    Examination-Participation Restrictions Community Activity;Driving    Rehab Potential Excellent     PT Frequency 1x / week    PT Duration 8 weeks    PT Treatment/Interventions ADLs/Self Care Home Management;Aquatic Therapy;Electrical Stimulation;Gait training;Stair training;Functional mobility training;Therapeutic  activities;Therapeutic exercise;Balance training;Neuromuscular re-education;Patient/family education;Orthotic Fit/Training;Energy conservation;Vestibular;Visual/perceptual remediation/compensation    PT Next Visit Plan Continue balance on compliant surfaces and more plyometric/dynamic movement - jump, jog, agility ladder.  Progress VOR training and gaze stability on moving targets.  Softball outside on grass.  Floor > stand training on LLE    Consulted and Agree with Plan of Care Patient           Patient will benefit from skilled therapeutic intervention in order to improve the following deficits and impairments:  Abnormal gait, Decreased balance, Decreased coordination, Decreased strength, Difficulty walking, Impaired tone, Impaired vision/preception  Visit Diagnosis: Muscle weakness (generalized)  Other abnormalities of gait and mobility  MS (multiple sclerosis) ([facility])  Unsteadiness on feet     Problem List Patient Active Problem List   Diagnosis Date Noted   High risk medication use 05/03/2019   Vitamin D deficiency 05/03/2019   Depression with anxiety 05/03/2019   Class 1 obesity due to excess calories without serious comorbidity with body mass index (BMI) of 31.0 to 31.9 in adult    Neurogenic bowel    Hypokalemia    Neurogenic bladder 04/18/2019   Demyelinating disorder ([facility]) 04/12/2019   ADHD 04/12/2019   Multiple sclerosis ([facility]) 04/10/2019   Tobacco abuse 04/10/2019    Sarita Haver Waucoma, PT, DPT  07/07/2019, 10:28 AM  [facility] [address] [place], [place], [address] Phone: [phone]   Fax:  [phone]  Name: Jean Carlson MRN: [id number] Date of Birth:  03/15/2000

## 2019-07-12 DIAGNOSIS — J302 Other seasonal allergic rhinitis: Secondary | ICD-10-CM | POA: Diagnosis not present

## 2019-07-12 DIAGNOSIS — F419 Anxiety disorder, unspecified: Secondary | ICD-10-CM | POA: Diagnosis not present

## 2019-07-12 DIAGNOSIS — F900 Attention-deficit hyperactivity disorder, predominantly inattentive type: Secondary | ICD-10-CM | POA: Diagnosis not present

## 2019-07-12 DIAGNOSIS — F339 Major depressive disorder, recurrent, unspecified: Secondary | ICD-10-CM | POA: Diagnosis not present

## 2019-07-13 ENCOUNTER — Encounter: Payer: BC Managed Care – PPO | Admitting: Occupational Therapy

## 2019-07-13 ENCOUNTER — Ambulatory Visit: Payer: BC Managed Care – PPO

## 2019-07-13 ENCOUNTER — Other Ambulatory Visit: Payer: Self-pay

## 2019-07-13 DIAGNOSIS — R41844 Frontal lobe and executive function deficit: Secondary | ICD-10-CM | POA: Diagnosis not present

## 2019-07-13 DIAGNOSIS — R2681 Unsteadiness on feet: Secondary | ICD-10-CM | POA: Diagnosis not present

## 2019-07-13 DIAGNOSIS — M6281 Muscle weakness (generalized): Secondary | ICD-10-CM

## 2019-07-13 DIAGNOSIS — G35 Multiple sclerosis: Secondary | ICD-10-CM | POA: Diagnosis not present

## 2019-07-13 DIAGNOSIS — R41840 Attention and concentration deficit: Secondary | ICD-10-CM | POA: Diagnosis not present

## 2019-07-13 DIAGNOSIS — R29818 Other symptoms and signs involving the nervous system: Secondary | ICD-10-CM | POA: Diagnosis not present

## 2019-07-13 DIAGNOSIS — R2689 Other abnormalities of gait and mobility: Secondary | ICD-10-CM | POA: Diagnosis not present

## 2019-07-13 DIAGNOSIS — R278 Other lack of coordination: Secondary | ICD-10-CM | POA: Diagnosis not present

## 2019-07-13 NOTE — Therapy (Signed)
[facility] [address] [place], [place], [address] Phone: [phone]   Fax:  [phone]  Physical Therapy Treatment  Patient Details  Name: Jean Carlson MRN: [id number] Date of Birth: 03/03/2000 Referring Provider (PT): Charlton Amor, PA-C   Encounter Date: 07/13/2019   PT End of Session - 07/13/19 1431    Visit Number 18    Number of Visits 23    Date for PT Re-Evaluation 08/20/19    Authorization Type BCBS    PT Start Time 1232    PT Stop Time 1315    PT Time Calculation (min) 43 min    Equipment Utilized During Treatment Gait belt    Activity Tolerance Patient tolerated treatment well    Behavior During Therapy Impulsive           Past Medical History:  Diagnosis Date  . ADHD     Past Surgical History:  Procedure Laterality Date  . WISDOM TOOTH EXTRACTION     x4    There were no vitals filed for this visit.   Subjective Assessment - 07/13/19 1234    Subjective Patient reports doing well, and no new complaints. Does believe she thinks she is having a reaction she believes may be due to her medications.    Patient is accompained by: --   friend   Pertinent History ADHD    Patient Stated Goals Play softball, play with the dog    Currently in Pain? No/denies                             [facility] Adult PT Treatment/Exercise - 07/13/19 0001      Ambulation/Gait   Ambulation/Gait Yes    Ambulation/Gait Assistance 6: Modified independent (Device/Increase time)    Assistive device None    Gait Pattern Step-through pattern;Decreased stance time - left;Right genu recurvatum;Left genu recurvatum    Ambulation Surface Level;Indoor    Gait Comments around therapy gym during session wiht completion of actvities.       Self-Care   Self-Care Other Self-Care Comments    Other Self-Care Comments  Due to patient reports of rash on posterior knee that started recent after Ocrevus  injection, PT educating patient to call MD and inform this of the skin rash/irriation.            Vestibular Treatment/Exercise - 07/13/19 0001      Vestibular Treatment/Exercise   Gaze Exercises X1 Viewing Horizontal;X1 Viewing Vertical      X1 Viewing Horizontal   Foot Position feet apart on foam, feet together on foam    Reps 4    Comments 2 x 45 secs with feet apart, 2 x 45 secs with feet together. Pt demo increased difficulty with feet together on complaint surfaces.       X1 Viewing Vertical   Foot Position feet together on foam    Reps 2    Comments 2 x 45 seconds              Balance Exercises - 07/13/19 1304      Balance Exercises: Standing   Standing Eyes Closed Narrow base of support (BOS);Foam/compliant surface;3 reps;30 secs    SLS Eyes open;Foam/compliant surface;3 reps;Time    SLS Time pt able to hold approx 25-30 secs    Rockerboard Anterior/posterior;Lateral;EO;Limitations    Rockerboard Limitations Completed static standing on rockerboard positioned ant/post and then laterally. With each position, PT  providing manual perturbations in each directions x 3 min each. Pt able to demo ability to hold steady on rockerboard overall, 1-2 instances needing UE support from // bars.     Balance Beam On blue balance beam, completing side stepping along balance beam with No UE support x 6 laps. Pt did have few instances of posterior sway and require UE for steadying.     Tandem Gait Forward;Foam/compliant surface    Tandem Gait Limitations completed x 3 laps in // bars on firm surface. Then progressed to completing on blue mat x 3 laps.              PT Education - 07/13/19 1239    Education Details educated/encouraged patient to call MD in regard to rash on posterior L knee.    Person(s) Educated Patient    Methods Explanation    Comprehension Verbalized understanding            PT Short Term Goals - 06/21/19 1352      PT SHORT TERM GOAL #1   Title = LTG               PT Long Term Goals - 06/21/19 1352      PT LONG TERM GOAL #1   Title Pt will demonstrate compliance with final HEP    Time 8    Period Weeks    Status New    Target Date 08/20/19      PT LONG TERM GOAL #2   Title Pt will demonstrate ability to safely stand lower self to floor and stand from floor leading with LLE MOD I    Time 8    Period Weeks    Status New    Target Date 08/20/19      PT LONG TERM GOAL #3   Title Pt will demonstrate <5 errors on BESS to indicate improved balance    Baseline TBD    Time 8    Period Weeks    Status New    Target Date 08/20/19      PT LONG TERM GOAL #4   Title Pt will demonstrate ability to catch/throw softball and jog in grass x 200' without LOB    Time 8    Period Weeks    Status New    Target Date 08/20/19      PT LONG TERM GOAL #5   Title Pt will demonstrate improved use of VOR as indicated by decrease in DVA to 3-4 line difference    Baseline 6 line difference - same    Time 8    Period Weeks    Status Revised    Target Date 08/20/19                 Plan - 07/13/19 1432    Clinical Impression Statement Today's skilled PT session focused on continued high level balance actvities and progression of gaze stabilization exercises. Patient tolerated all progression well, still demonstrates decreased balance with EC and complaint surfaces. PT providing education to patient to reach out to MD due to recent onset of skin rash. Patient will continue to benefit from skilled PT services to progress toward all unmet goals and improve overall functional mobility.    Personal Factors and Comorbidities Comorbidity 1    Comorbidities ADHD    Examination-Activity Limitations Locomotion Level;Stairs    Examination-Participation Restrictions Community Activity;Driving    Rehab Potential Excellent    PT Frequency 1x / week    PT  Duration 8 weeks    PT Treatment/Interventions ADLs/Self Care Home Management;Aquatic  Therapy;Electrical Stimulation;Gait training;Stair training;Functional mobility training;Therapeutic activities;Therapeutic exercise;Balance training;Neuromuscular re-education;Patient/family education;Orthotic Fit/Training;Energy conservation;Vestibular;Visual/perceptual remediation/compensation    PT Next Visit Plan Balance (compliant surfaces, EC, SLS), plyometric/dynamic movement - jump, jog, agility ladder.  Progress VOR training and gaze stability on moving targets.  Softball outside on grass.  Floor > stand training on LLE    Consulted and Agree with Plan of Care Patient           Patient will benefit from skilled therapeutic intervention in order to improve the following deficits and impairments:  Abnormal gait, Decreased balance, Decreased coordination, Decreased strength, Difficulty walking, Impaired tone, Impaired vision/preception  Visit Diagnosis: Muscle weakness (generalized)  Other abnormalities of gait and mobility  Unsteadiness on feet  Other lack of coordination     Problem List Patient Active Problem List   Diagnosis Date Noted  . High risk medication use 05/03/2019  . Vitamin D deficiency 05/03/2019  . Depression with anxiety 05/03/2019  . Class 1 obesity due to excess calories without serious comorbidity with body mass index (BMI) of 31.0 to 31.9 in adult   . Neurogenic bowel   . Hypokalemia   . Neurogenic bladder 04/18/2019  . Demyelinating disorder ([place]) 04/12/2019  . ADHD 04/12/2019  . Multiple sclerosis ([place]) 04/10/2019  . Tobacco abuse 04/10/2019    Jones Bales, PT, DPT 07/13/2019, 2:35 PM  [facility] [address] [place], [place], [address] Phone: [phone]   Fax:  [phone]  Name: Jean Carlson MRN: [id number] Date of Birth: 2000-10-28

## 2019-07-19 ENCOUNTER — Ambulatory Visit: Payer: BC Managed Care – PPO | Admitting: Physical Therapy

## 2019-07-19 ENCOUNTER — Encounter: Payer: Self-pay | Admitting: Physical Therapy

## 2019-07-19 ENCOUNTER — Other Ambulatory Visit: Payer: Self-pay

## 2019-07-19 DIAGNOSIS — M6281 Muscle weakness (generalized): Secondary | ICD-10-CM | POA: Diagnosis not present

## 2019-07-19 DIAGNOSIS — R278 Other lack of coordination: Secondary | ICD-10-CM

## 2019-07-19 DIAGNOSIS — G35 Multiple sclerosis: Secondary | ICD-10-CM | POA: Diagnosis not present

## 2019-07-19 DIAGNOSIS — R2681 Unsteadiness on feet: Secondary | ICD-10-CM

## 2019-07-19 DIAGNOSIS — R41840 Attention and concentration deficit: Secondary | ICD-10-CM | POA: Diagnosis not present

## 2019-07-19 DIAGNOSIS — R29818 Other symptoms and signs involving the nervous system: Secondary | ICD-10-CM | POA: Diagnosis not present

## 2019-07-19 DIAGNOSIS — R41844 Frontal lobe and executive function deficit: Secondary | ICD-10-CM | POA: Diagnosis not present

## 2019-07-19 DIAGNOSIS — R2689 Other abnormalities of gait and mobility: Secondary | ICD-10-CM

## 2019-07-19 NOTE — Patient Instructions (Signed)
Gaze Stabilization: Standing Feet Apart - with target on busy background - window, busy picture, wall paper or wrapping paper on the wall and letter on wrapping paper    Place target on pattern background. Feet shoulder width apart, keeping eyes on target on wall __3__ feet away, busy background - tilt head down 15-30 and move head side to side for _60___ seconds. Repeat while moving head up and down for _60___ seconds. Do __2__ sessions per day. Place target on pattern background.   Access Code: [email or web address] URL: [email or web address] Date: 07/19/2019 Prepared by: Bufford Lope  Exercises Standing Gaze Stabilization with Head Rotation and Horizontal Arm Movement - 1 x daily - 7 x weekly - 2 sets - 10 reps Standing Gaze Stabilization with Head Nod and Vertical Arm Movement - 1 x daily - 7 x weekly - 2 sets - 10 reps Forward Walking with Half Turns - Quick - 1 x daily - 7 x weekly - 1 sets - 10 reps

## 2019-07-19 NOTE — Therapy (Signed)
[facility] [address] [place], [place], [address] Phone: [phone]   Fax:  [phone]  Physical Therapy Treatment  Patient Details  Name: Jean Carlson MRN: [id number] Date of Birth: Sep 29, 2000 Referring Provider (PT): Charlton Amor, PA-C   Encounter Date: 07/19/2019   PT End of Session - 07/19/19 1227    Visit Number 19    Number of Visits 23    Date for PT Re-Evaluation 08/20/19    Authorization Type BCBS    PT Start Time 0808    PT Stop Time 0853    PT Time Calculation (min) 45 min    Activity Tolerance Patient tolerated treatment well    Behavior During Therapy Impulsive           Past Medical History:  Diagnosis Date  . ADHD     Past Surgical History:  Procedure Laterality Date  . WISDOM TOOTH EXTRACTION     x4    There were no vitals filed for this visit.   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  Still not getting good sleep.  Gets another infusion today - brain is still running.  Softball is done until the fall.    Patient is accompained by: --   friend   Pertinent History ADHD    Patient Stated Goals Play softball, play with the dog    Currently in Pain? Yes    Pain Location Foot    Pain Orientation Left;Right    Pain Descriptors / Indicators Sore    Pain Type Acute pain    Pain Onset [facility] Adult PT Treatment/Exercise - 07/19/19 0823      Self-Care   Self-Care Other Self-Care Comments    Other Self-Care Comments  Discussed ways to practice mindfulness at night to calm down brain and prepare for sleep: reading a boring book, using a Calm App for guided meditation or guided imagery, sound App with the sound of waves or rain/thunder storm       Therapeutic Activites    Therapeutic Activities --           Vestibular Treatment/Exercise - 07/19/19 0825      Vestibular Treatment/Exercise   Vestibular Treatment Provided Gaze    Habituation Exercises 180 degree Turns    Gaze Exercises X1 Viewing Horizontal;X1 Viewing Vertical;X2 Viewing Horizontal;X2 Viewing Vertical      180 degree Turns   Number of Reps  6    Symptom Description  to L and R while walking forwards 30' x 6 laps; cues to turn and keep walking.  Only dizziness once pt ceased walking.  Pt still using spotting for stability prior to turning      X1 Viewing Horizontal   Foot Position feet together on foam; feet apart on solid surface with busy background    Reps 2    Comments 60 seconds      X1 Viewing Vertical   Foot Position feet together on foam; feet apart on solid surface with busy background    Reps 2    Comments 60 seconds      X2  Viewing Horizontal   Foot Position seated and then standing with feet apart    Reps 2     Comments 10 reps each      X2 Viewing Vertical   Foot Position seated and then standing with feet apart    Reps 2    Comments 10 reps each            Gaze Stabilization: Standing Feet Apart - with target on busy background - window, busy picture, wall paper or wrapping paper on the wall and letter on wrapping paper    Place target on pattern background. Feet shoulder width apart, keeping eyes on target on wall __3__ feet away, busy background - tilt head down 15-30 and move head side to side for _60___ seconds. Repeat while moving head up and down for _60___ seconds. Do __2__ sessions per day. Place target on pattern background.   Access Code: [email or web address] URL: [email or web address] Date: 07/19/2019 Prepared by: Misty Stanley  Exercises Standing Gaze Stabilization with Head Rotation and Horizontal Arm Movement - 1 x daily - 7 x weekly - 2 sets - 10 reps Standing Gaze Stabilization with Head Nod and Vertical Arm  Movement - 1 x daily - 7 x weekly - 2 sets - 10 reps Forward Walking with Half Turns - Quick - 1 x daily - 7 x weekly - 1 sets - 10 reps        PT Education - 07/19/19 1227    Education Details updated HEP; had to give pt new exercise code for Medbridge due to previous one not entered into chart    Person(s) Educated Patient    Methods Explanation;Demonstration;Handout    Comprehension Verbalized understanding;Returned demonstration            PT Short Term Goals - 06/21/19 1352      PT SHORT TERM GOAL #1   Title = LTG             PT Long Term Goals - 06/21/19 1352      PT LONG TERM GOAL #1   Title Pt will demonstrate compliance with final HEP    Time 8    Period Weeks    Status New    Target Date 08/20/19      PT LONG TERM GOAL #2   Title Pt will demonstrate ability to safely stand lower self to floor and stand from floor leading with LLE MOD I    Time 8    Period Weeks    Status New    Target Date 08/20/19      PT LONG TERM GOAL #3   Title Pt will demonstrate <5 errors on BESS to indicate improved balance    Baseline TBD    Time 8    Period Weeks    Status New    Target Date 08/20/19      PT LONG TERM GOAL #4   Title Pt will demonstrate ability to catch/throw softball and jog in grass x 200' without LOB    Time 8    Period Weeks    Status New    Target Date 08/20/19      PT LONG TERM GOAL #5   Title Pt will demonstrate improved use of VOR as indicated by decrease in DVA to 3-4 line difference    Baseline 6 line difference - same    Time 8    Period Weeks    Status Revised    Target Date 08/20/19  Plan - 07/19/19 1228    Clinical Impression Statement Unable to perform plyometric training today due to pain in heel pad after prolonged time on feet at work.  Performed gastroc stretches and then reviewed and upgraded x1 viewing to busy background and x2 viewing to standing - pt tolerated well and is demonstrated significant  improvement in VOR.  Added to HEP walking with 180 turns; pt continues to use spotting.  Will continue to address and progress towards LTG.    Personal Factors and Comorbidities Comorbidity 1    Comorbidities ADHD    Examination-Activity Limitations Locomotion Level;Stairs    Examination-Participation Restrictions Community Activity;Driving    Rehab Potential Excellent    PT Frequency 1x / week    PT Duration 8 weeks    PT Treatment/Interventions ADLs/Self Care Home Management;Aquatic Therapy;Electrical Stimulation;Gait training;Stair training;Functional mobility training;Therapeutic activities;Therapeutic exercise;Balance training;Neuromuscular re-education;Patient/family education;Orthotic Fit/Training;Energy conservation;Vestibular;Visual/perceptual remediation/compensation    PT Next Visit Plan Balance (compliant surfaces, EC, SLS), plyometric/dynamic movement - jump, jog, agility ladder, quick side stepping with ball toss inside and outside, backwards gait with 180 turns.  Continue to progress x1 and x2 viewing.  Softball outside on grass.  Floor > stand training on LLE    Consulted and Agree with Plan of Care Patient           Patient will benefit from skilled therapeutic intervention in order to improve the following deficits and impairments:  Abnormal gait, Decreased balance, Decreased coordination, Decreased strength, Difficulty walking, Impaired tone, Impaired vision/preception  Visit Diagnosis: Muscle weakness (generalized)  Other abnormalities of gait and mobility  Unsteadiness on feet  Other lack of coordination  Other symptoms and signs involving the nervous system     Problem List Patient Active Problem List   Diagnosis Date Noted  . High risk medication use 05/03/2019  . Vitamin D deficiency 05/03/2019  . Depression with anxiety 05/03/2019  . Class 1 obesity due to excess calories without serious comorbidity with body mass index (BMI) of 31.0 to 31.9 in adult     . Neurogenic bowel   . Hypokalemia   . Neurogenic bladder 04/18/2019  . Demyelinating disorder (HCC) 04/12/2019  . ADHD 04/12/2019  . Multiple sclerosis (HCC) 04/10/2019  . Tobacco abuse 04/10/2019    Dierdre Highman, PT, DPT 07/19/19    12:34 PM    [facility] [address] [place], [place], [address] Phone: [phone]   Fax:  [phone]  Name: Jean Carlson MRN: [id number] Date of Birth: [birth year]

## 2019-07-26 ENCOUNTER — Ambulatory Visit: Payer: BC Managed Care – PPO | Admitting: Physical Therapy

## 2019-07-26 ENCOUNTER — Encounter: Payer: Self-pay | Admitting: Physical Therapy

## 2019-07-26 ENCOUNTER — Other Ambulatory Visit: Payer: Self-pay

## 2019-07-26 DIAGNOSIS — G35 Multiple sclerosis: Secondary | ICD-10-CM | POA: Diagnosis not present

## 2019-07-26 DIAGNOSIS — M6281 Muscle weakness (generalized): Secondary | ICD-10-CM | POA: Diagnosis not present

## 2019-07-26 DIAGNOSIS — R2681 Unsteadiness on feet: Secondary | ICD-10-CM | POA: Diagnosis not present

## 2019-07-26 DIAGNOSIS — R2689 Other abnormalities of gait and mobility: Secondary | ICD-10-CM

## 2019-07-26 DIAGNOSIS — R41844 Frontal lobe and executive function deficit: Secondary | ICD-10-CM | POA: Diagnosis not present

## 2019-07-26 DIAGNOSIS — R29818 Other symptoms and signs involving the nervous system: Secondary | ICD-10-CM | POA: Diagnosis not present

## 2019-07-26 DIAGNOSIS — R41840 Attention and concentration deficit: Secondary | ICD-10-CM | POA: Diagnosis not present

## 2019-07-26 DIAGNOSIS — R278 Other lack of coordination: Secondary | ICD-10-CM | POA: Diagnosis not present

## 2019-07-26 NOTE — Therapy (Signed)
[facility] [address] [place], [place], [address] Phone: [phone]   Fax:  [phone]  Physical Therapy Treatment  Patient Details  Name: Jean Carlson MRN: [id number] Date of Birth: [birth year] Referring Provider (PT): Charlton Amor, PA-C   Encounter Date: 07/26/2019   PT End of Session - 07/26/19 1425    Visit Number 20    Number of Visits 23    Date for PT Re-Evaluation 08/20/19    Authorization Type BCBS    PT Start Time 1405    PT Stop Time 1445    PT Time Calculation (min) 40 min    Activity Tolerance Patient tolerated treatment well;No increased pain;Other (comment)   limited in beginning due to emotional state   Behavior During Therapy Impulsive           Past Medical History:  Diagnosis Date  . ADHD     Past Surgical History:  Procedure Laterality Date  . WISDOM TOOTH EXTRACTION     x4    There were no vitals filed for this visit.   Subjective Assessment - 07/26/19 1408    Subjective Pt has been having a rough couple of days due to issues with her Dad and her new job. 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    Pertinent History ADHD    Patient Stated Goals Play softball, play with the dog    Currently in Pain? No/denies    Pain Score 0-No pain                 [facility] Adult PT Treatment/Exercise - 07/26/19 1426      Transfers   Transfers Sit to Stand;Stand to Sit    Sit to Stand 6: Modified independent (Device/Increase time)    Stand to Sit 6: Modified independent (Device/Increase time)    Floor to Transfer --      Ambulation/Gait   Ambulation/Gait Yes    Ambulation/Gait Assistance 6: Modified independent (Device/Increase time)    Ambulation Distance (Feet) 2000 Feet   x1   Assistive device None    Gait Pattern Step-through pattern;Decreased stance time - left;Right genu recurvatum;Left genu recurvatum    Ambulation Surface  Level;Indoor;Unlevel;Outdoor;Paved    Gait Comments gait outdoors working on activity tolerance while allowing pt to vent and talk so to calm down. no imbalance noted.       Self-Care   Self-Care Other Self-Care Comments    Other Self-Care Comments  Provided pt with a quiet listening platform in which to talk about recent argument with her dad about her job, his texing of her best friend and her mom's sudden interest in being involved in her healthcare. Pt very emotional/angry at start of session. After walking while talking it over pt was more calm. Did disucss trying to approach her dad in a calm manner, not the angry/cussing manner in which she wanted to confront him. Pt agreed to try this by the end of the session.       Neuro Re-ed    Neuro Re-ed Details  for balance/muscle re-ed: with use of floor ladder to work on plymetric jumps and agitilty drills with rest breaks between ex's. min guard assist with cues to slow down for safety and on ex form/technique                      PT Short Term Goals -  06/21/19 1352      PT SHORT TERM GOAL #1   Title = LTG             PT Long Term Goals - 06/21/19 1352      PT LONG TERM GOAL #1   Title Pt will demonstrate compliance with final HEP    Time 8    Period Weeks    Status New    Target Date 08/20/19      PT LONG TERM GOAL #2   Title Pt will demonstrate ability to safely stand lower self to floor and stand from floor leading with LLE MOD I    Time 8    Period Weeks    Status New    Target Date 08/20/19      PT LONG TERM GOAL #3   Title Pt will demonstrate <5 errors on BESS to indicate improved balance    Baseline TBD    Time 8    Period Weeks    Status New    Target Date 08/20/19      PT LONG TERM GOAL #4   Title Pt will demonstrate ability to catch/throw softball and jog in grass x 200' without LOB    Time 8    Period Weeks    Status New    Target Date 08/20/19      PT LONG TERM GOAL #5   Title Pt will  demonstrate improved use of VOR as indicated by decrease in DVA to 3-4 line difference    Baseline 6 line difference - same    Time 8    Period Weeks    Status Revised    Target Date 08/20/19                 Plan - 07/26/19 1425    Clinical Impression Statement Today's skilled session initially focused on helping pt to calm down/relax over life events/arguing with her dad with no issues noted with gait on outdoor surfaces while engaged in conversation. Remainder of session focused on high level dynamic gait/plyometrics with rest breaks taken due to fatigue. The pt is progressing toward goals and should benefit from contineud PT to progress toward unmet goals.    Personal Factors and Comorbidities Comorbidity 1    Comorbidities ADHD    Examination-Activity Limitations Locomotion Level;Stairs    Examination-Participation Restrictions Community Activity;Driving    Rehab Potential Excellent    PT Frequency 1x / week    PT Duration 8 weeks    PT Treatment/Interventions ADLs/Self Care Home Management;Aquatic Therapy;Electrical Stimulation;Gait training;Stair training;Functional mobility training;Therapeutic activities;Therapeutic exercise;Balance training;Neuromuscular re-education;Patient/family education;Orthotic Fit/Training;Energy conservation;Vestibular;Visual/perceptual remediation/compensation    PT Next Visit Plan Balance (compliant surfaces, EC, SLS), plyometric/dynamic movement - jump, jog, agility ladder, quick side stepping with ball toss inside and outside, backwards gait with 180 turns.  Continue to progress x1 and x2 viewing.  Softball outside on grass.  Floor > stand training on LLE    Consulted and Agree with Plan of Care Patient           Patient will benefit from skilled therapeutic intervention in order to improve the following deficits and impairments:  Abnormal gait, Decreased balance, Decreased coordination, Decreased strength, Difficulty walking, Impaired tone,  Impaired vision/preception  Visit Diagnosis: Muscle weakness (generalized)  Other abnormalities of gait and mobility  Unsteadiness on feet     Problem List Patient Active Problem List   Diagnosis Date Noted  . High risk medication use 05/03/2019  . Vitamin  D deficiency 05/03/2019  . Depression with anxiety 05/03/2019  . Class 1 obesity due to excess calories without serious comorbidity with body mass index (BMI) of 31.0 to 31.9 in adult   . Neurogenic bowel   . Hypokalemia   . Neurogenic bladder 04/18/2019  . Demyelinating disorder (HCC) 04/12/2019  . ADHD 04/12/2019  . Multiple sclerosis (HCC) 04/10/2019  . Tobacco abuse 04/10/2019    Sallyanne Kuster, PTA, [facility] [address] [place], [place] [address] [phone] 07/26/19, 5:46 PM   Name: Jean Carlson MRN: [id number] Date of Birth: [birth year]

## 2019-08-03 ENCOUNTER — Other Ambulatory Visit: Payer: Self-pay

## 2019-08-03 ENCOUNTER — Ambulatory Visit: Payer: BC Managed Care – PPO | Attending: Physician Assistant | Admitting: Physical Therapy

## 2019-08-03 ENCOUNTER — Encounter: Payer: Self-pay | Admitting: Physical Therapy

## 2019-08-03 ENCOUNTER — Ambulatory Visit: Payer: BC Managed Care – PPO | Admitting: Neurology

## 2019-08-03 ENCOUNTER — Encounter: Payer: Self-pay | Admitting: Neurology

## 2019-08-03 VITALS — BP 121/69 | HR 88 | Wt 202.0 lb

## 2019-08-03 DIAGNOSIS — R2681 Unsteadiness on feet: Secondary | ICD-10-CM

## 2019-08-03 DIAGNOSIS — Z79899 Other long term (current) drug therapy: Secondary | ICD-10-CM | POA: Diagnosis not present

## 2019-08-03 DIAGNOSIS — E559 Vitamin D deficiency, unspecified: Secondary | ICD-10-CM

## 2019-08-03 DIAGNOSIS — G35 Multiple sclerosis: Secondary | ICD-10-CM | POA: Diagnosis not present

## 2019-08-03 DIAGNOSIS — R29818 Other symptoms and signs involving the nervous system: Secondary | ICD-10-CM | POA: Diagnosis not present

## 2019-08-03 DIAGNOSIS — M6281 Muscle weakness (generalized): Secondary | ICD-10-CM | POA: Diagnosis not present

## 2019-08-03 DIAGNOSIS — N319 Neuromuscular dysfunction of bladder, unspecified: Secondary | ICD-10-CM

## 2019-08-03 DIAGNOSIS — R278 Other lack of coordination: Secondary | ICD-10-CM | POA: Diagnosis not present

## 2019-08-03 DIAGNOSIS — R296 Repeated falls: Secondary | ICD-10-CM | POA: Insufficient documentation

## 2019-08-03 DIAGNOSIS — R2689 Other abnormalities of gait and mobility: Secondary | ICD-10-CM | POA: Diagnosis not present

## 2019-08-03 NOTE — Progress Notes (Signed)
[facility]  PATIENT: Jean Carlson DOB: 2000-05-19  REFERRING DOCTOR OR PCP:  Milus Height, PA SOURCE: Patient, notes from [facility], imaging and lab reports, MRI images personally reviewed.  _________________________________   HISTORICAL  CHIEF COMPLAINT:  Chief Complaint  Patient presents with  . Follow-up    RM 12 with mother. Last seen 05/03/2019.  . Multiple Sclerosis    On Ocrevus. Last infusion: 07/19/19. Next infusion: 02/14/20.She is feeling good. She has noticed a big difference in her tremors. No new sx.     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  08/03/19: She is on Ocrevus and tolerates it well.   She denies exacerbations though tremors seem worse.  She feels gait is doing better.  She can go up and down stairs without holding the bannister.  Her left arm is slightly weaker, especially in the wrist.   She has tremors only on the left which seem worse.   She denies leg weakness.   She has urinary urgency and is going to see urology tomorrow.  She had urodynamic testing and has follow-up.   She denies nocturia but does have some hesitancy.    Vision is doing well.  She denies too much fatigue but it fluctuates.    She has variable sleep and has some sleep onset insomnia.    No significant problem with sleep maintenance.   Mood is ok -- occasionl irritability.  She feels cognition is doing better but still has some pauses in speech.  Multitasking is more difficult.    She takes 5000 U daily Vit D.  Level was 23.1.   We discussed healthier diet like the mediterranean.    Stay active and exercise.       MS History: In March 2021, she had the onset of slurred speech,left hand tremor and clumsiness, abnormal eye movements on the right and urinary incontinence.   She went to the ED.    She was admitted and received 5 days of IV Solu-Medrol.   While in the hospital she started to note improvement and she  has continued to improve.    In retrospect, in February, she had vertigo x 3 weeks and had several falls, one hyperextending her left knee.   She saw orthopedics.   Also in late 2020, she had a couple weeks of fluctuating numbness in her left hand.  MRI showed enhancing lesions in the pons, medulla and spinal cord (and several other nonenhancing brainstem and spinal cord.   thereflreo she was started on Ocrevus 05/2019.     MRIs of the brain and cervical spine dated 04/09/2019.  The MRI of the brain shows multiple T2/FLAIR hyperintense foci in the hemispheres, brainstem and cerebellum.  2 foci in the pons and medulla enhance after contrast.  There are also several foci in the hemispheres that enhanced.  There are several foci within the spinal cord.  These are located at C2, more to the right, C3, towards the right and centrally and C4-C5 centrally.  There is subtle enhancement of the C4C5 focus.    There is no family history of MS or other autoimmune diseases.   REVIEW OF SYSTEMS: Constitutional: No fevers, chills, sweats, or change in appetite. Eyes: No visual changes, double vision, eye pain Ear, nose and throat: No hearing loss, ear pain, nasal congestion, sore throat Cardiovascular: No chest pain, palpitations Respiratory: No shortness of breath at rest or with exertion.  No wheezes GastrointestinaI: No nausea, vomiting, diarrhea, abdominal pain, fecal incontinence Genitourinary: No dysuria, urinary retention or frequency.  No incontinence or nocturia.   Musculoskeletal: No neck pain, back pain Integumentary: No rash, pruritus, skin lesions Neurological: as above Psychiatric: As above Endocrine: No palpitations, diaphoresis, change in appetite, change in weigh or increased thirst Hematologic/Lymphatic: No anemia, purpura, petechiae. Allergic/Immunologic: No itchy/runny eyes, nasal congestion, recent allergic reactions, rashes  ALLERGIES: Allergies  Allergen Reactions  . Amoxicillin  Other (See Comments)    CAUSED BAD NIGHTMARES  . Codeine Nausea Only    SEVERE NAUSEA  . Vyvanse [Lisdexamfetamine] Other (See Comments)    "Caused depression to the point of self-harm"    HOME MEDICATIONS:  Current Outpatient Medications:  .  acetaminophen (TYLENOL) 325 MG tablet, Take 2 tablets (650 mg total) by mouth every 6 (six) hours as needed for mild pain (or Fever >/= 101)., Disp:  , Rfl:  .  bisacodyl (DULCOLAX) 5 MG EC tablet, Take 2 tablets (10 mg total) by mouth daily after supper., Disp: 30 tablet, Rfl: 0 .  FLUoxetine (PROZAC) 40 MG capsule, Take 1 capsule (40 mg total) by mouth in the morning., Disp: 30 capsule, Rfl: 3 .  methylphenidate 36 MG PO CR tablet, Take 1 tablet (36 mg total) by mouth daily., Disp: 30 tablet, Rfl: 0 .  ocrelizumab (OCREVUS) 300 MG/10ML injection, Inject into the vein once., Disp: , Rfl:   PAST MEDICAL HISTORY: Past Medical History:  Diagnosis Date  . ADHD     PAST SURGICAL HISTORY: Past Surgical History:  Procedure Laterality Date  . WISDOM TOOTH EXTRACTION     x4    FAMILY HISTORY: Family History  Problem Relation Age of Onset  . Hyperlipidemia Maternal Grandmother   . Hypertension Maternal Grandmother   . Diabetes Paternal Grandfather   . Hypertension Paternal Grandfather   . Heart failure Paternal Grandfather     SOCIAL HISTORY:  Social History   Socioeconomic History  . Marital status: Single    Spouse name: Not on file  . Number of children: Not on file  . Years of education: Not on file  . Highest education level: Not on file  Occupational History  . Not on file  Tobacco Use  . Smoking status: Current Every Day Smoker  . Smokeless tobacco: Never Used  Vaping Use  . Vaping Use: Every day  Substance and Sexual Activity  . Alcohol use: Not Currently  . Drug use: Never  . Sexual activity: Not on file  Other Topics Concern  . Not on file  Social History Narrative   Right handed   Caffeine use: tea and soda  daily   Social Determinants of Health   Financial Resource Strain:   . Difficulty of Paying Living Expenses:   Food Insecurity:   . Worried About Programme researcher, broadcasting/film/video in the Last Year:   . Barista in the Last Year:   Transportation Needs:   . Freight forwarder (Medical):   [name] Lack of Transportation (Non-Medical):   Physical Activity:   . Days of Exercise per Week:   . Minutes of Exercise per Session:   Stress:   . Feeling of Stress :   Social Connections:   . Frequency of Communication with Friends and Family:   . Frequency of Social Gatherings with Friends and Family:   . Attends Religious Services:   . Active Member of Clubs or Organizations:   . Attends Banker  Meetings:   [name] Marital Status:   Intimate Partner Violence:   . Fear of Current or Ex-Partner:   . Emotionally Abused:   [name] Physically Abused:   . Sexually Abused:      PHYSICAL EXAM  Vitals:   08/03/19 1423  BP: 121/69  Pulse: 88  Weight: 202 lb (91.6 kg)    Body mass index is 34.67 kg/m.  No exam data present  General: The patient is well-developed and well-nourished and in no acute distress  HEENT:  Head is [place]/AT.  Sclera are anicteric.     Skin: Extremities are without rash or  edema.   Neurologic Exam  Mental status: The patient is alert and oriented x 3 at the time of the examination. The patient has apparent normal recent and remote memory, with an apparently normal attention span and concentration ability.   Speech is normal.  Cranial nerves: Extraocular movements are full.   There is good facial sensation to soft touch bilaterally.Facial strength is normal.  Trapezius and sternocleidomastoid strength is normal. No dysarthria is noted.  No obvious hearing deficits are noted.  Motor:  Muscle bulk is normal.   Tone is normal. Strength is  5 / 5 in all 4 extremities.   Sensory: Sensory testing is intact to soft touch and vibration sensation in arms and mild reduced  vibration in left leg.  Coordination: Cerebellar testing reveals normal right and near normal left good finger-nose-finger and heel-to-shin bilaterally.  Gait and station: Station is normal.   Gait is near normal.  Tandem gait is mildly wide.. Romberg is negative.   Reflexes: Deep tendon reflexes are symmetric but increase at the knees, left > right with spread and at the ankles with nonsustained clonus.        DIAGNOSTIC DATA (LABS, IMAGING, TESTING) - I reviewed patient records, labs, notes, testing and imaging myself where available.  Lab Results  Component Value Date   WBC 6.2 04/24/2019   HGB 14.2 04/24/2019   HCT 40.4 04/24/2019   MCV 82.4 04/24/2019   PLT 226 04/24/2019      Component Value Date/Time   NA 139 04/24/2019 0607   K 3.8 04/24/2019 0607   CL 104 04/24/2019 0607   CO2 24 04/24/2019 0607   GLUCOSE 81 04/24/2019 0607   BUN 7 04/24/2019 0607   CREATININE 0.74 04/24/2019 0607   CALCIUM 9.0 04/24/2019 0607   PROT 6.0 (L) 04/18/2019 0511   ALBUMIN 3.5 04/18/2019 0511   AST 30 04/18/2019 0511   ALT 43 04/18/2019 0511   ALKPHOS 51 04/18/2019 0511   BILITOT 1.1 04/18/2019 0511   GFRNONAA >60 04/24/2019 0607   GFRAA >60 04/24/2019 0607    Lab Results  Component Value Date   TSH 2.107 04/09/2019       ASSESSMENT AND PLAN  Multiple sclerosis (HCC)  High risk medication use  Neurogenic bladder  Vitamin D deficiency   1.   Continue Ocrevus.  Her next infusion will be in December.  We will check blood work around the time of the next visit and also check an MRI around that time to determine if there is any subclinical progression. 2.   We discussed a healthy diet and exercise.  Continue taking vitamin D. 3.   I will see her back in 6 months or sooner if there are new or worsening neurologic symptoms.  Kelleigh Skerritt A. Epimenio Foot, MD, [facility] 08/03/2019, 3:35 PM Certified in Neurology, Clinical Neurophysiology, Sleep Medicine and Neuroimaging  [facility] [address] [place], [place] [address] [phone]

## 2019-08-04 DIAGNOSIS — R350 Frequency of micturition: Secondary | ICD-10-CM | POA: Diagnosis not present

## 2019-08-04 DIAGNOSIS — N3941 Urge incontinence: Secondary | ICD-10-CM | POA: Diagnosis not present

## 2019-08-04 NOTE — Therapy (Signed)
[facility] [address] [place], [place], [address] Phone: [phone]   Fax:  [phone]  Physical Therapy Treatment  Patient Details  Name: Jean Carlson MRN: [id number] Date of Birth: Oct 18, 2000 Referring Provider (PT): Charlton Amor, PA-C   Encounter Date: 08/03/2019   PT End of Session - 08/03/19 1319    Visit Number 21    Number of Visits 23    Date for PT Re-Evaluation 08/20/19    Authorization Type BCBS    PT Start Time 1317    PT Stop Time 1357    PT Time Calculation (min) 40 min    Equipment Utilized During Treatment Gait belt    Activity Tolerance Patient tolerated treatment well;No increased pain    Behavior During Therapy Impulsive           Past Medical History:  Diagnosis Date  . ADHD     Past Surgical History:  Procedure Laterality Date  . WISDOM TOOTH EXTRACTION     x4    There were no vitals filed for this visit.   Subjective Assessment - 08/03/19 1317    Subjective No new complaints. Dizziness is getting better "till I spin on a dime". No falls, however did turn and fall off small platform at work, landing on her feet. She reports she underestimated the size of the platform and stepped to far when turning. Was able to jump to land on feet. See's Dr. Epimenio Foot today after PT session.    Pertinent History ADHD    Patient Stated Goals Play softball, play with the dog    Currently in Pain? No/denies    Pain Score 0-No pain                    [facility] Adult PT Treatment/Exercise - 08/03/19 1321      Ambulation/Gait   Ambulation/Gait Yes    Ambulation/Gait Assistance 6: Modified independent (Device/Increase time)    Assistive device None   treadmill with bil UE support   Gait Pattern Step-through pattern;Decreased stance time - left;Right genu recurvatum;Left genu recurvatum    Ambulation Surface Level;Indoor    Gait Comments on treadmill working on uphill and downhill with  bil UE support. 2.0 mph with uphill, 1.7 mph with downhill for 3 minutes each way.       Neuro Re-ed    Neuro Re-ed Details  for balance muscle re-ed: use for floor ladder with white slats- forward and lateral plymetric jumps for several laps each along ladder. worked on stepping forward and laterally as welll to address coordination drills as well with min guard assist.; on open space along ~20 foot pathway- moderately paced side stepping while tossing ball to PTA for 3 laps each way with rest breaks between each lap.                Balance Exercises - 08/03/19 1340      Balance Exercises: Standing   Standing Eyes Closed Narrow base of support (BOS);Wide (BOA);Head turns;Foam/compliant surface;Other reps (comment);30 secs;Limitations    Standing Eyes Closed Limitations on open dense blue foam in corner with chair in front for safety: feet together- EC no head movements, then feet hip width apart- for EC head movements left<>right, then up<>down. min guard assist with cues for posture/weight shifting for balance.                 PT Short Term Goals - 06/21/19 1352  PT SHORT TERM GOAL #1   Title = LTG             PT Long Term Goals - 06/21/19 1352      PT LONG TERM GOAL #1   Title Pt will demonstrate compliance with final HEP    Time 8    Period Weeks    Status New    Target Date 08/20/19      PT LONG TERM GOAL #2   Title Pt will demonstrate ability to safely stand lower self to floor and stand from floor leading with LLE MOD I    Time 8    Period Weeks    Status New    Target Date 08/20/19      PT LONG TERM GOAL #3   Title Pt will demonstrate <5 errors on BESS to indicate improved balance    Baseline TBD    Time 8    Period Weeks    Status New    Target Date 08/20/19      PT LONG TERM GOAL #4   Title Pt will demonstrate ability to catch/throw softball and jog in grass x 200' without LOB    Time 8    Period Weeks    Status New    Target Date 08/20/19       PT LONG TERM GOAL #5   Title Pt will demonstrate improved use of VOR as indicated by decrease in DVA to 3-4 line difference    Baseline 6 line difference - same    Time 8    Period Weeks    Status Revised    Target Date 08/20/19                 Plan - 08/03/19 1319    Clinical Impression Statement Today's skilled session continued to focus on activity tolerance and high level balance activities with rest breaks taken as needed. The pt is progressing toward goals and should benefit from continued PT to progress toward unmet goals.    Personal Factors and Comorbidities Comorbidity 1    Comorbidities ADHD    Examination-Activity Limitations Locomotion Level;Stairs    Examination-Participation Restrictions Community Activity;Driving    Rehab Potential Excellent    PT Frequency 1x / week    PT Duration 8 weeks    PT Treatment/Interventions ADLs/Self Care Home Management;Aquatic Therapy;Electrical Stimulation;Gait training;Stair training;Functional mobility training;Therapeutic activities;Therapeutic exercise;Balance training;Neuromuscular re-education;Patient/family education;Orthotic Fit/Training;Energy conservation;Vestibular;Visual/perceptual remediation/compensation    PT Next Visit Plan Balance (compliant surfaces, EC, SLS), plyometric/dynamic movement - jump, jog, agility ladder, quick side stepping with ball toss inside and outside, backwards gait with 180 turns.  Continue to progress x1 and x2 viewing.  Softball outside on grass.  Floor > stand training on LLE    Consulted and Agree with Plan of Care Patient           Patient will benefit from skilled therapeutic intervention in order to improve the following deficits and impairments:  Abnormal gait, Decreased balance, Decreased coordination, Decreased strength, Difficulty walking, Impaired tone, Impaired vision/preception  Visit Diagnosis: Muscle weakness (generalized)  Other abnormalities of gait and  mobility  Unsteadiness on feet     Problem List Patient Active Problem List   Diagnosis Date Noted  . High risk medication use 05/03/2019  . Vitamin D deficiency 05/03/2019  . Depression with anxiety 05/03/2019  . Class 1 obesity due to excess calories without serious comorbidity with body mass index (BMI) of 31.0 to 31.9 in adult   .  Neurogenic bowel   . Hypokalemia   . Neurogenic bladder 04/18/2019  . Demyelinating disorder (HCC) 04/12/2019  . ADHD 04/12/2019  . Multiple sclerosis (HCC) 04/10/2019  . Tobacco abuse 04/10/2019    Sallyanne Kuster, PTA, [facility] [address], [address] [place], [place] [address] [phone] 08/04/19, 12:16 PM   Name: ALONDA WEABER MRN: [id number] Date of Birth: 2000/11/02

## 2019-08-09 ENCOUNTER — Other Ambulatory Visit: Payer: Self-pay

## 2019-08-09 ENCOUNTER — Encounter: Payer: Self-pay | Admitting: Physical Therapy

## 2019-08-09 ENCOUNTER — Ambulatory Visit: Payer: BC Managed Care – PPO | Admitting: Physical Therapy

## 2019-08-09 DIAGNOSIS — R29818 Other symptoms and signs involving the nervous system: Secondary | ICD-10-CM | POA: Diagnosis not present

## 2019-08-09 DIAGNOSIS — R2681 Unsteadiness on feet: Secondary | ICD-10-CM | POA: Diagnosis not present

## 2019-08-09 DIAGNOSIS — R2689 Other abnormalities of gait and mobility: Secondary | ICD-10-CM

## 2019-08-09 DIAGNOSIS — R278 Other lack of coordination: Secondary | ICD-10-CM

## 2019-08-09 DIAGNOSIS — M6281 Muscle weakness (generalized): Secondary | ICD-10-CM | POA: Diagnosis not present

## 2019-08-09 DIAGNOSIS — R296 Repeated falls: Secondary | ICD-10-CM | POA: Diagnosis not present

## 2019-08-09 NOTE — Patient Instructions (Addendum)
Upgraded putty from green > blue firm  Recommended pt purchase and wear shirts to work that are more breathable and wick moisture away to keep cool.   Gaze Stabilization: Standing Feet Apart - with target on busy background - window, busy picture, wall paper or wrapping paper on the wall and letter on wrapping paper    Place target on pattern background, standing on foam/pillow/cushion with feet close together Feet close together, keeping eyes on target on wall __3__ feet away, busy background - tilt head down 15-30 and move head side to side for _60___ seconds. Repeat while moving head up and down for _60___ seconds. Do __2__ sessions per day. Place target on pattern background.   Access Code: [email or web address] URL: [email or web address] Date: 08/09/2019 Prepared by: Bufford Lope  Exercises Standing Gaze Stabilization with Head Rotation and Horizontal Arm Movement - 1 x daily - 7 x weekly - 1 sets - 60 seconds hold Standing Gaze Stabilization with Head Nod and Vertical Arm Movement - 1 x daily - 7 x weekly - 1 sets - 60 second hold

## 2019-08-09 NOTE — Therapy (Signed)
[facility] [address] [place], [place], [address] Phone: [phone]   Fax:  [phone]  Physical Therapy Treatment  Patient Details  Name: Jean Carlson MRN: [id number] Date of Birth: Sep 27, 2000 Referring Provider (PT): Charlton Amor, PA-C   Encounter Date: 08/09/2019   PT End of Session - 08/09/19 1158    Visit Number 22    Number of Visits 23    Date for PT Re-Evaluation 08/20/19    Authorization Type BCBS    PT Start Time 1109    PT Stop Time 1150    PT Time Calculation (min) 41 min    Activity Tolerance Patient tolerated treatment well;No increased pain    Behavior During Therapy WFL for tasks assessed/performed           Past Medical History:  Diagnosis Date  . ADHD     Past Surgical History:  Procedure Laterality Date  . WISDOM TOOTH EXTRACTION     x4    There were no vitals filed for this visit.   Subjective Assessment - 08/09/19 1117    Subjective Had appointment with neurology who [place].  When fatigued, still having difficulty with grip on LUE for work.    Pertinent History ADHD    Patient Stated Goals Play softball, play with the dog    Currently in Pain? No/denies                             [facility] Adult PT Treatment/Exercise - 08/09/19 1154      Therapeutic Activites    Therapeutic Activities Work [employer]    Work Simulation Discussed activities that still are problematic for patient.  Pt reports difficulty with grip in LUE especially when fatigued; upgraded putty based on OT recommendations.  Pt reports getting overheated at work: discussed other options for shirts to improve heat tolerance.  Reports difficulty with repeated bending down to load/unload boxes (not dizziness, more back pain).  Discussed ways to bring box up from floor, squatting to lift and lower and widening stance to lower COG if not able to bring box up.            Vestibular Treatment/Exercise - 08/09/19 1152      Vestibular Treatment/Exercise   Vestibular Treatment Provided Gaze    Habituation Exercises 180 degree Turns    Gaze Exercises X1 Viewing Horizontal;X1 Viewing Vertical;X2 Viewing Horizontal;X2 Viewing Vertical      180 degree Turns   Number of Reps  3    Symptom Description  to L and R with gait forwards at faster speeds with no symptoms, removed from HEP; may attempted with retro gait next session      X1 Viewing Horizontal   Foot Position feet apart and feet together on compliant surface    Reps 2    Comments 60 seconds      X1 Viewing Vertical   Foot Position feet apart and feet together on compliant surface    Reps 2    Comments 60 seconds      X2 Viewing Horizontal   Foot Position standing feet apart    Reps 2     Comments 10 reps > 60 seconds mild symptoms      X2 Viewing Vertical   Foot Position standing feet apart    Reps 2    Comments 10 reps > 60 seconds mild symptoms  Upgraded putty from green > blue firm  Recommended pt purchase and wear shirts to work that are more breathable and wick moisture away to keep cool.   Gaze Stabilization: Standing Feet Apart - with target on busy background - window, busy picture, wall paper or wrapping paper on the wall and letter on wrapping paper    Place target on pattern background, standing on foam/pillow/cushion with feet close together Feet close together, keeping eyes on target on wall __3__ feet away, busy background - tilt head down 15-30 and move head side to side for _60___ seconds. Repeat while moving head up and down for _60___ seconds. Do __2__ sessions per day. Place target on pattern background.   Access Code: [email or web address] URL: [email or web address] Date: 08/09/2019 Prepared by: Bufford Lope  Exercises Standing Gaze Stabilization with Head Rotation and Horizontal Arm Movement - 1 x daily - 7 x weekly - 1 sets - 60 seconds  hold Standing Gaze Stabilization with Head Nod and Vertical Arm Movement - 1 x daily - 7 x weekly - 1 sets - 60 second hold            PT Education - 08/09/19 1157    Education Details see TA and instructions, updated HEP    Person(s) Educated Patient    Methods Explanation;Demonstration;Handout    Comprehension Verbalized understanding;Returned demonstration            PT Short Term Goals - 06/21/19 1352      PT SHORT TERM GOAL #1   Title = LTG             PT Long Term Goals - 06/21/19 1352      PT LONG TERM GOAL #1   Title Pt will demonstrate compliance with final HEP    Time 8    Period Weeks    Status New    Target Date 08/20/19      PT LONG TERM GOAL #2   Title Pt will demonstrate ability to safely stand lower self to floor and stand from floor leading with LLE MOD I    Time 8    Period Weeks    Status New    Target Date 08/20/19      PT LONG TERM GOAL #3   Title Pt will demonstrate <5 errors on BESS to indicate improved balance    Baseline TBD    Time 8    Period Weeks    Status New    Target Date 08/20/19      PT LONG TERM GOAL #4   Title Pt will demonstrate ability to catch/throw softball and jog in grass x 200' without LOB    Time 8    Period Weeks    Status New    Target Date 08/20/19      PT LONG TERM GOAL #5   Title Pt will demonstrate improved use of VOR as indicated by decrease in DVA to 3-4 line difference    Baseline 6 line difference - same    Time 8    Period Weeks    Status Revised    Target Date 08/20/19                 Plan - 08/09/19 1159    Clinical Impression Statement Continued to discuss ongoing impairments and limitations at work and possible compensations or modifications pt can use for energy conservation.  Updated HEP based on ongoing impairments and removed walking with turns due to no longer provoking  symptoms.  Pt will likely be ready for D/C at next visit.    Personal Factors and Comorbidities  Comorbidity 1    Comorbidities ADHD    Examination-Activity Limitations Locomotion Level;Stairs    Examination-Participation Restrictions Community Activity;Driving    Rehab Potential Excellent    PT Frequency 1x / week    PT Duration 8 weeks    PT Treatment/Interventions ADLs/Self Care Home Management;Aquatic Therapy;Electrical Stimulation;Gait training;Stair training;Functional mobility training;Therapeutic activities;Therapeutic exercise;Balance training;Neuromuscular re-education;Patient/family education;Orthotic Fit/Training;Energy conservation;Vestibular;Visual/perceptual remediation/compensation    PT Next Visit Plan Check LTG and send to me for D/C.  Print final HEP    Consulted and Agree with Plan of Care Patient           Patient will benefit from skilled therapeutic intervention in order to improve the following deficits and impairments:  Abnormal gait, Decreased balance, Decreased coordination, Decreased strength, Difficulty walking, Impaired tone, Impaired vision/preception  Visit Diagnosis: Muscle weakness (generalized)  Other abnormalities of gait and mobility  Unsteadiness on feet  Other lack of coordination  Other symptoms and signs involving the nervous system     Problem List Patient Active Problem List   Diagnosis Date Noted  . High risk medication use 05/03/2019  . Vitamin D deficiency 05/03/2019  . Depression with anxiety 05/03/2019  . Class 1 obesity due to excess calories without serious comorbidity with body mass index (BMI) of 31.0 to 31.9 in adult   . Neurogenic bowel   . Hypokalemia   . Neurogenic bladder 04/18/2019  . Demyelinating disorder (HCC) 04/12/2019  . ADHD 04/12/2019  . Multiple sclerosis (HCC) 04/10/2019  . Tobacco abuse 04/10/2019    Dierdre Highman, PT, DPT 08/09/19    12:03 PM    [facility] [address] [place], [place], [address] Phone: [phone]   Fax:   [phone]  Name: Jean Carlson MRN: [id number] Date of Birth: [birth year]

## 2019-08-16 ENCOUNTER — Ambulatory Visit: Payer: BC Managed Care – PPO | Admitting: Physical Therapy

## 2019-08-23 ENCOUNTER — Encounter: Payer: Self-pay | Admitting: Physical Therapy

## 2019-08-23 ENCOUNTER — Ambulatory Visit: Payer: BC Managed Care – PPO | Admitting: Physical Therapy

## 2019-08-23 ENCOUNTER — Other Ambulatory Visit: Payer: Self-pay

## 2019-08-23 DIAGNOSIS — R2689 Other abnormalities of gait and mobility: Secondary | ICD-10-CM

## 2019-08-23 DIAGNOSIS — M6281 Muscle weakness (generalized): Secondary | ICD-10-CM

## 2019-08-23 DIAGNOSIS — R2681 Unsteadiness on feet: Secondary | ICD-10-CM | POA: Diagnosis not present

## 2019-08-23 DIAGNOSIS — R278 Other lack of coordination: Secondary | ICD-10-CM | POA: Diagnosis not present

## 2019-08-23 DIAGNOSIS — R29818 Other symptoms and signs involving the nervous system: Secondary | ICD-10-CM

## 2019-08-23 DIAGNOSIS — R296 Repeated falls: Secondary | ICD-10-CM | POA: Diagnosis not present

## 2019-08-23 NOTE — Therapy (Signed)
[facility] [address] [place], [place], [address] Phone: [phone]   Fax:  [phone]  Physical Therapy Treatment and D/C Summary  Patient Details  Name: Jean Carlson MRN: [id number] Date of Birth: October 06, 2000 Referring Provider (PT): Cathlyn Parsons, PA-C   Encounter Date: 08/23/2019   PT End of Session - 08/23/19 1031    Visit Number 23    Number of Visits 23    Date for PT Re-Evaluation 08/20/19    Authorization Type BCBS    PT Start Time 0934    PT Stop Time 1019    PT Time Calculation (min) 45 min    Activity Tolerance Patient tolerated treatment well;No increased pain    Behavior During Therapy WFL for tasks assessed/performed           Past Medical History:  Diagnosis Date  . ADHD     Past Surgical History:  Procedure Laterality Date  . WISDOM TOOTH EXTRACTION     x4    There were no vitals filed for this visit.   Subjective Assessment - 08/23/19 0936    Subjective Had an incident where she hit her knee on truck bumper and body kept moving forwards causing hyperextension; has been hurting since then.  Has a compression knee brace she uses for stability.  Work has been busy.    Pertinent History ADHD    Patient Stated Goals Play softball, play with the dog    Currently in Pain? Yes              [facility] PT Assessment - 08/23/19 1006      Observation/Other Assessments   Focus on Therapeutic Outcomes (FOTO)  N/A      Balance   Balance Assessed Yes      High Level Balance   High Level Balance Comments Peformed Balance Error Scoring System (BESS) test with 0/0 on Double leg stance, 2/3 errors on SLS, 0/3 errors on tandem stance - tested with LLE and shoes on.  Total of 8 errors               Vestibular Assessment - 08/23/19 1008      Visual Acuity   Static 10    Dynamic 6                    [facility] Adult PT Treatment/Exercise - 08/23/19 0941      Transfers    Floor to Transfer 6: Modified independent (Device/Increase time);5: Supervision    Floor to Transfer Details (indicate cue type and reason) from cross leg > tall kneeling > half kneeling but advised pt to tuck toes underneath prior to transitioning to half kneeling and standing to avoid over stretch of muscles on dorsal surface of ankle and foot.  Pt return demonstrated x 3; reported decreased strain on ankle performing with toes tucked.      Therapeutic Activites    Therapeutic Activities Other Therapeutic Activities    Other Therapeutic Activities Advised to wear compression brace at work and ice after work to back of knee.  Also advised pt to keep water with her and to sip water throughout the day and gradually decrease use of Soda to prevent dehydration and overheating.                  PT Education - 08/23/19 1030    Education Details TA; indications for returning to therapy and process for returning to therapy if needed  Person(s) Educated Patient    Methods Explanation    Comprehension Verbalized understanding            PT Short Term Goals - 06/21/19 1352      PT SHORT TERM GOAL #1   Title = LTG             PT Long Term Goals - 08/23/19 0939      PT LONG TERM GOAL #1   Title Pt will demonstrate compliance with final HEP    Time 8    Period Weeks    Status Achieved      PT LONG TERM GOAL #2   Title Pt will demonstrate ability to safely stand lower self to floor and stand from floor leading with LLE MOD I    Time 8    Period Weeks    Status Achieved      PT LONG TERM GOAL #3   Title Pt will demonstrate <5 errors on BESS to indicate improved balance    Baseline 8 errors    Time 8    Period Weeks    Status Partially Met      PT LONG TERM GOAL #4   Title Pt will demonstrate ability to catch/throw softball and jog in grass x 200' without LOB    Baseline not able to assess on D/C due to heat outside    Time 8    Period Weeks    Status Unable to assess       PT LONG TERM GOAL #5   Title Pt will demonstrate improved use of VOR as indicated by decrease in DVA to 3-4 line difference    Baseline 4 line difference    Time 8    Period Weeks    Status Achieved                 Plan - 08/23/19 1032    Clinical Impression Statement Treatment session focused on progress towards LTG.  Pt has made good progress and has met 3/5 LTG and demonstrates independence with final HEP, improved VOR for gaze stabilization and improved safety when transitioning from floor <> stand for work.  Unable to re-assess softball skills today due to heat outside but pt is not currently playing softball and was able to finish her softball season without incident.  Pt completed BESS with >8 errors and continues to demonstrate greatest instability on unstable/compliant surfaces.  Pt is safe for D/C at this time with HEP and recommendations to prevent exacerbation of weakness/fatigue when working.    Personal Factors and Comorbidities Comorbidity 1    Comorbidities ADHD    Examination-Activity Limitations Locomotion Level;Stairs    Examination-Participation Restrictions Community Activity;Driving    Rehab Potential Excellent    PT Frequency 1x / week    PT Duration 8 weeks    PT Treatment/Interventions ADLs/Self Care Home Management;Aquatic Therapy;Electrical Stimulation;Gait training;Stair training;Functional mobility training;Therapeutic activities;Therapeutic exercise;Balance training;Neuromuscular re-education;Patient/family education;Orthotic Fit/Training;Energy conservation;Vestibular;Visual/perceptual remediation/compensation    Consulted and Agree with Plan of Care Patient           Patient will benefit from skilled therapeutic intervention in order to improve the following deficits and impairments:  Abnormal gait, Decreased balance, Decreased coordination, Decreased strength, Difficulty walking, Impaired tone, Impaired vision/preception  Visit  Diagnosis: Muscle weakness (generalized)  Other abnormalities of gait and mobility  Unsteadiness on feet  Repeated falls  Other symptoms and signs involving the nervous system     Problem List Patient Active Problem  List   Diagnosis Date Noted  . High risk medication use 05/03/2019  . Vitamin D deficiency 05/03/2019  . Depression with anxiety 05/03/2019  . Class 1 obesity due to excess calories without serious comorbidity with body mass index (BMI) of 31.0 to 31.9 in adult   . Neurogenic bowel   . Hypokalemia   . Neurogenic bladder 04/18/2019  . Demyelinating disorder ([place]) 04/12/2019  . ADHD 04/12/2019  . Multiple sclerosis ([place]) 04/10/2019  . Tobacco abuse 04/10/2019    PHYSICAL THERAPY DISCHARGE SUMMARY  Visits from Start of Care: 23  Current functional level related to goals / functional outcomes: See LTG achievement and impression statement above.   Remaining deficits: LLE weakness and imbalance with narrow BOS and compliant surfaces; heat intolerance   Education / Equipment: HEP  Plan: Patient agrees to discharge.  Patient goals were partially met. Patient is being discharged due to being pleased with the current functional level.  ?????     Rico Junker, PT, DPT 08/23/19    10:38 AM    [facility] [address] [place], [place], [address] Phone: [phone]   Fax:  [phone]  Name: PRIYANKA CAUSEY MRN: [id number] Date of Birth: 2000/08/05

## 2019-09-19 ENCOUNTER — Telehealth: Payer: Self-pay | Admitting: Neurology

## 2019-09-19 NOTE — Telephone Encounter (Signed)
Pt called wanting to know if the doctor can write a letter so that she doesn't have to get the vaccine since she does not agree with it and she also wants to know if she is able to donate blood and other blood products. Please advise.

## 2019-09-19 NOTE — Telephone Encounter (Signed)
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  Therefore, I can't write a letter saying not to do the vaccine.  It is ultimately her choice.  She is able to donate blood if she wants to.

## 2019-09-19 NOTE — Telephone Encounter (Signed)
Called, LVM relaying Dr. Bonnita Hollow message below. Advised her to call back if she has further questions/concerns.

## 2019-10-04 DIAGNOSIS — R351 Nocturia: Secondary | ICD-10-CM | POA: Diagnosis not present

## 2019-10-04 DIAGNOSIS — N3941 Urge incontinence: Secondary | ICD-10-CM | POA: Diagnosis not present

## 2019-10-23 ENCOUNTER — Encounter
Payer: BC Managed Care – PPO | Attending: Physical Medicine and Rehabilitation | Admitting: Physical Medicine and Rehabilitation

## 2019-11-10 DIAGNOSIS — H40033 Anatomical narrow angle, bilateral: Secondary | ICD-10-CM | POA: Diagnosis not present

## 2019-11-10 DIAGNOSIS — H43393 Other vitreous opacities, bilateral: Secondary | ICD-10-CM | POA: Diagnosis not present

## 2020-01-11 DIAGNOSIS — Z23 Encounter for immunization: Secondary | ICD-10-CM | POA: Diagnosis not present

## 2020-01-11 DIAGNOSIS — Z131 Encounter for screening for diabetes mellitus: Secondary | ICD-10-CM | POA: Diagnosis not present

## 2020-01-11 DIAGNOSIS — Z13220 Encounter for screening for lipoid disorders: Secondary | ICD-10-CM | POA: Diagnosis not present

## 2020-01-11 DIAGNOSIS — F900 Attention-deficit hyperactivity disorder, predominantly inattentive type: Secondary | ICD-10-CM | POA: Diagnosis not present

## 2020-01-11 DIAGNOSIS — F339 Major depressive disorder, recurrent, unspecified: Secondary | ICD-10-CM | POA: Diagnosis not present

## 2020-01-11 DIAGNOSIS — R300 Dysuria: Secondary | ICD-10-CM | POA: Diagnosis not present

## 2020-01-11 DIAGNOSIS — N898 Other specified noninflammatory disorders of vagina: Secondary | ICD-10-CM | POA: Diagnosis not present

## 2020-01-11 DIAGNOSIS — Z0000 Encounter for general adult medical examination without abnormal findings: Secondary | ICD-10-CM | POA: Diagnosis not present

## 2020-01-11 DIAGNOSIS — G35 Multiple sclerosis: Secondary | ICD-10-CM | POA: Diagnosis not present

## 2020-01-11 DIAGNOSIS — A5901 Trichomonal vulvovaginitis: Secondary | ICD-10-CM | POA: Diagnosis not present

## 2020-01-24 DIAGNOSIS — Z3009 Encounter for other general counseling and advice on contraception: Secondary | ICD-10-CM | POA: Diagnosis not present

## 2020-01-24 DIAGNOSIS — Z3202 Encounter for pregnancy test, result negative: Secondary | ICD-10-CM | POA: Diagnosis not present

## 2020-02-05 ENCOUNTER — Encounter: Payer: Self-pay | Admitting: Neurology

## 2020-02-05 ENCOUNTER — Ambulatory Visit (INDEPENDENT_AMBULATORY_CARE_PROVIDER_SITE_OTHER): Payer: BC Managed Care – PPO | Admitting: Neurology

## 2020-02-05 VITALS — BP 118/73 | HR 85 | Ht 65.0 in | Wt 210.0 lb

## 2020-02-05 DIAGNOSIS — N319 Neuromuscular dysfunction of bladder, unspecified: Secondary | ICD-10-CM | POA: Diagnosis not present

## 2020-02-05 DIAGNOSIS — G35 Multiple sclerosis: Secondary | ICD-10-CM | POA: Diagnosis not present

## 2020-02-05 DIAGNOSIS — E559 Vitamin D deficiency, unspecified: Secondary | ICD-10-CM | POA: Diagnosis not present

## 2020-02-05 DIAGNOSIS — F418 Other specified anxiety disorders: Secondary | ICD-10-CM

## 2020-02-05 DIAGNOSIS — F909 Attention-deficit hyperactivity disorder, unspecified type: Secondary | ICD-10-CM | POA: Insufficient documentation

## 2020-02-05 DIAGNOSIS — Z79899 Other long term (current) drug therapy: Secondary | ICD-10-CM

## 2020-02-05 NOTE — Progress Notes (Addendum)
[facility]  PATIENT: Jean Carlson DOB: 04-24-2000  REFERRING DOCTOR OR PCP:  Milus Height, PA SOURCE: Patient, notes from [facility], imaging and lab reports, MRI images personally reviewed.  _________________________________   HISTORICAL  CHIEF COMPLAINT:  Chief Complaint  Patient presents with  . Multiple Sclerosis    Rm 12, 6 month FU on Ocrevus, due 02/14/20 "zoning out, ? 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  Update 02/05/2020: She is on Ocrevus and tolerates it well.   She denies exacerbations though tremors seem worse.  She notes zoning out more --  Losing focus and daydreaming.  This happens more with less sleep.    She usually needs 9-10 hours of sleep and often just gets 9 hours.   She falls asleep easily but feels she gets less deep sleep and wakes up some.   She gets some fatigue but only after a full day's work around 3-6 pm, worse with poor sleep the previous night.    She feels gait and balance doing better.  She can go up and down stairs without holding the bannister.  Her left arm is slightly weaker, especially in the wrist.   She has tremors only on the left which seem worse if she gets angry/upset.   Also worse with intention.  Fortunately, she is right handed and writing is not affected.     She denies weakness or spasticity.   She has less urinary urgency since starting Myrbetriq    Vision is doing well.  She denies too much fatigue but it fluctuates.    She has variable sleep and has some sleep onset insomnia.    No significant problem with sleep maintenance.   Mood is ok -- occasionl irritability.  She stopped fluoxetine.   . Memory/cognition is doing better but still has some speech issues.  Multitasking is sometimes difficult.    Concerta had not helped.    She takes 5000 U daily Vit D.  Level was 23.1.   We discussed healthier diet like the  mediterranean.    Stay active and exercise.       MS History: In March 2021, she had the onset of slurred speech,left hand tremor and clumsiness, abnormal eye movements on the right and urinary incontinence.   She went to the ED.    She was admitted and received 5 days of IV Solu-Medrol.   While in the hospital she started to note improvement and she has continued to improve.    In retrospect, in February, she had vertigo x 3 weeks and had several falls, one hyperextending her left knee.   She saw orthopedics.   Also in late 2020, she had a couple weeks of fluctuating numbness in her left hand.  MRI showed enhancing lesions in the pons, medulla and spinal cord (and several other nonenhancing brainstem and spinal cord.   thereflreo she was started on Ocrevus 05/2019.     MRIs of the brain and cervical spine dated 04/09/2019.  The MRI of the brain shows multiple T2/FLAIR hyperintense foci in the hemispheres, brainstem and cerebellum.  2 foci in the pons and medulla enhance after contrast.  There are also several foci in the hemispheres that enhanced.  There are several foci within the spinal cord.  These are located at C2, more to the right, C3, towards the right and centrally and C4-C5 centrally.  There is subtle enhancement of the C4C5 focus.    There is no family history of MS or other autoimmune diseases.   REVIEW OF SYSTEMS: Constitutional: No fevers, chills, sweats, or change in appetite. Eyes: No visual changes, double vision, eye pain Ear, nose and throat: No hearing loss, ear pain, nasal congestion, sore throat Cardiovascular: No chest pain, palpitations Respiratory: No shortness of breath at rest or with exertion.   No wheezes GastrointestinaI: No nausea, vomiting, diarrhea, abdominal pain, fecal incontinence Genitourinary: No dysuria, urinary retention or frequency.  No incontinence or nocturia.   Musculoskeletal: No neck pain, back pain Integumentary: No rash, pruritus, skin  lesions Neurological: as above Psychiatric: As above Endocrine: No palpitations, diaphoresis, change in appetite, change in weigh or increased thirst Hematologic/Lymphatic: No anemia, purpura, petechiae. Allergic/Immunologic: No itchy/runny eyes, nasal congestion, recent allergic reactions, rashes  ALLERGIES: Allergies  Allergen Reactions  . Amoxicillin Other (See Comments)    CAUSED BAD NIGHTMARES  . Codeine Nausea Only    SEVERE NAUSEA  . Vyvanse [Lisdexamfetamine] Other (See Comments)    "Caused depression to the point of self-harm"    HOME MEDICATIONS:  Current Outpatient Medications:  .  acetaminophen (TYLENOL) 325 MG tablet, Take 2 tablets (650 mg total) by mouth every 6 (six) hours as needed for mild pain (or Fever >/= 101)., Disp:  , Rfl:  .  bisacodyl (DULCOLAX) 5 MG EC tablet, Take 2 tablets (10 mg total) by mouth daily after supper., Disp: 30 tablet, Rfl: 0 .  etonogestrel-ethinyl estradiol (NUVARING) 0.12-0.015 MG/24HR vaginal ring, , Disp: , Rfl:  .  ocrelizumab (OCREVUS) 300 MG/10ML injection, Inject into the vein once., Disp: , Rfl:  .  FLUoxetine (PROZAC) 40 MG capsule, Take 1 capsule (40 mg total) by mouth in the morning. (Patient not taking: Reported on 02/05/2020), Disp: 30 capsule, Rfl: 3 .  methylphenidate 36 MG PO CR tablet, Take 1 tablet (36 mg total) by mouth daily. (Patient not taking: Reported on 02/05/2020), Disp: 30 tablet, Rfl: 0  PAST MEDICAL HISTORY: Past Medical History:  Diagnosis Date  . ADHD   . Multiple sclerosis (HCC)     PAST SURGICAL HISTORY: Past Surgical History:  Procedure Laterality Date  . WISDOM TOOTH EXTRACTION     x4    FAMILY HISTORY: Family History  Problem Relation Age of Onset  . Hyperlipidemia Maternal Grandmother   . Hypertension Maternal Grandmother   . Diabetes Paternal Grandfather   . Hypertension Paternal Grandfather   . Heart failure Paternal Grandfather     SOCIAL HISTORY:  Social History   Socioeconomic  History  . Marital status: Single    Spouse name: Not on file  . Number of children: Not on file  . Years of education: Not on file  . 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 Smoking status: Current Every Day Smoker  . Smokeless tobacco: Never Used  Vaping Use  . Vaping Use: Every day  Substance and Sexual Activity  . Alcohol use: Not Currently  . Drug use: Never  . Sexual activity: Not on file  Other Topics Concern  . Not on file  Social History Narrative   Right handed   Caffeine use: tea and soda daily   Social Determinants of Health   Financial Resource Strain: Not on file  Food Insecurity: Not on file  Transportation Needs: Not on file  Physical Activity: Not on file  Stress:  Not on file  Social Connections: Not on file  Intimate Partner Violence: Not on file     PHYSICAL EXAM  Vitals:   02/05/20 1116  BP: 118/73  Pulse: 85  Weight: 210 lb (95.3 kg)  Height: 5\' 5"  (1.651 m)    Body mass index is 34.95 kg/m.  No exam data present  General: The patient is well-developed and well-nourished and in no acute distress  HEENT:  Head is [place]/AT.    Skin: Extremities are without rash or  edema.   Neurologic Exam  Mental status: The patient is alert and oriented x 3 at the time of the examination. The patient has apparent normal recent and remote memory, with an apparently normal attention span and concentration ability.   Speech is normal.  Cranial nerves: Extraocular movements are full.   There is good facial sensation to soft touch bilaterally.Facial strength is normal.  Trapezius and sternocleidomastoid strength is normal. No dysarthria is noted.  No obvious hearing deficits are noted.  Motor:  Muscle bulk is normal.   Tone is normal. Strength is  5 / 5 in all 4 extremities.   Sensory: Sensory testing is intact to soft touch and vibration sensation in arms and mild reduced vibration in  left leg.  Coordination: Cerebellar testing reveals normal right and near normal left good finger-nose-finger and heel-to-shin bilaterally.  Gait and station: Station is normal.   Gait is normal.  Tandem gait is mildly wide... Romberg is negative.   Reflexes: Deep tendon reflexes show spread at the knees and increased at the ankles with nonsustained clonus.        DIAGNOSTIC DATA (LABS, IMAGING, TESTING) - I reviewed patient records, labs, notes, testing and imaging myself where available.  Lab Results  Component Value Date   WBC 6.2 04/24/2019   HGB 14.2 04/24/2019   HCT 40.4 04/24/2019   MCV 82.4 04/24/2019   PLT 226 04/24/2019      Component Value Date/Time   NA 139 04/24/2019 0607   K 3.8 04/24/2019 0607   CL 104 04/24/2019 0607   CO2 24 04/24/2019 0607   GLUCOSE 81 04/24/2019 0607   BUN 7 04/24/2019 0607   CREATININE 0.74 04/24/2019 0607   CALCIUM 9.0 04/24/2019 0607   PROT 6.0 (L) 04/18/2019 0511   ALBUMIN 3.5 04/18/2019 0511   AST 30 04/18/2019 0511   ALT 43 04/18/2019 0511   ALKPHOS 51 04/18/2019 0511   BILITOT 1.1 04/18/2019 0511   GFRNONAA >60 04/24/2019 0607   GFRAA >60 04/24/2019 0607    Lab Results  Component Value Date   TSH 2.107 04/09/2019       ASSESSMENT AND PLAN  Multiple sclerosis (HCC) - Plan: IgG, IgA, IgM, CD19 and CD20, Flow Cytometry, CBC with Differential/Platelet  High risk medication use - Plan: IgG, IgA, IgM, CD19 and CD20, Flow Cytometry, CBC with Differential/Platelet  Vitamin D deficiency  Neurogenic bladder  Depression with anxiety   1.   Continue Ocrevus.   We will check blood work for IgG/IgM, CD19/CD20 and CBC. 2.    Continue taking vitamin D.  Try to eat well and exercise as tolerated. 3.   if focus/attention worsens consider a stimulant.   4.  I will see her back in 6 months or sooner if there are new or worsening neurologic symptoms.  Around the time of the next visit we will check an MRI of the brain and  cervical spine  Javia Dillow A. Epimenio Foot, MD, [facility] 02/05/2020, 4:47  PM Certified in Neurology, Clinical Neurophysiology, Sleep Medicine and Neuroimaging  [facility] [address] [place], [place] [address] [phone]

## 2020-02-06 LAB — CBC WITH DIFFERENTIAL/PLATELET
Basophils Absolute: 0 10*3/uL (ref 0.0–0.2)
Basos: 0 %
EOS (ABSOLUTE): 0 10*3/uL (ref 0.0–0.4)
Eos: 1 %
Hematocrit: 46.9 % — ABNORMAL HIGH (ref 34.0–46.6)
Hemoglobin: 15.6 g/dL (ref 11.1–15.9)
Immature Grans (Abs): 0 10*3/uL (ref 0.0–0.1)
Immature Granulocytes: 0 %
Lymphocytes Absolute: 1.1 10*3/uL (ref 0.7–3.1)
Lymphs: 19 %
MCH: 28 pg (ref 26.6–33.0)
MCHC: 33.3 g/dL (ref 31.5–35.7)
MCV: 84 fL (ref 79–97)
Monocytes Absolute: 0.5 10*3/uL (ref 0.1–0.9)
Monocytes: 8 %
Neutrophils Absolute: 4.4 10*3/uL (ref 1.4–7.0)
Neutrophils: 72 %
Platelets: 256 10*3/uL (ref 150–450)
RBC: 5.57 x10E6/uL — ABNORMAL HIGH (ref 3.77–5.28)
RDW: 13.3 % (ref 11.7–15.4)
WBC: 6 10*3/uL (ref 3.4–10.8)

## 2020-02-06 LAB — IGG, IGA, IGM
IgA/Immunoglobulin A, Serum: 268 mg/dL (ref 87–352)
IgG (Immunoglobin G), Serum: 1012 mg/dL (ref 586–1602)
IgM (Immunoglobulin M), Srm: 46 mg/dL (ref 26–217)

## 2020-02-09 LAB — CD19 AND CD20, FLOW CYTOMETRY

## 2020-02-13 ENCOUNTER — Telehealth: Payer: Self-pay | Admitting: *Deleted

## 2020-02-13 NOTE — Telephone Encounter (Signed)
Called and spoke with pt about results per Dr. Sater note. Pt verbalized understanding. 

## 2020-02-13 NOTE — Telephone Encounter (Signed)
-----   Message from Asa Lente, MD sent at 02/09/2020 12:13 PM EST ----- Please let the patient know that the lab work is fine.

## 2020-02-15 DIAGNOSIS — N3941 Urge incontinence: Secondary | ICD-10-CM | POA: Diagnosis not present

## 2020-02-15 DIAGNOSIS — R350 Frequency of micturition: Secondary | ICD-10-CM | POA: Diagnosis not present

## 2020-02-19 DIAGNOSIS — M545 Low back pain, unspecified: Secondary | ICD-10-CM | POA: Diagnosis not present

## 2020-02-19 DIAGNOSIS — G35 Multiple sclerosis: Secondary | ICD-10-CM | POA: Diagnosis not present

## 2020-02-19 DIAGNOSIS — M5137 Other intervertebral disc degeneration, lumbosacral region: Secondary | ICD-10-CM | POA: Diagnosis not present

## 2020-02-19 DIAGNOSIS — M9903 Segmental and somatic dysfunction of lumbar region: Secondary | ICD-10-CM | POA: Diagnosis not present

## 2020-02-21 DIAGNOSIS — M5137 Other intervertebral disc degeneration, lumbosacral region: Secondary | ICD-10-CM | POA: Diagnosis not present

## 2020-02-21 DIAGNOSIS — M545 Low back pain, unspecified: Secondary | ICD-10-CM | POA: Diagnosis not present

## 2020-02-21 DIAGNOSIS — M9903 Segmental and somatic dysfunction of lumbar region: Secondary | ICD-10-CM | POA: Diagnosis not present

## 2020-02-22 DIAGNOSIS — M5137 Other intervertebral disc degeneration, lumbosacral region: Secondary | ICD-10-CM | POA: Diagnosis not present

## 2020-02-22 DIAGNOSIS — M9903 Segmental and somatic dysfunction of lumbar region: Secondary | ICD-10-CM | POA: Diagnosis not present

## 2020-02-22 DIAGNOSIS — M545 Low back pain, unspecified: Secondary | ICD-10-CM | POA: Diagnosis not present

## 2020-02-26 DIAGNOSIS — M545 Low back pain, unspecified: Secondary | ICD-10-CM | POA: Diagnosis not present

## 2020-02-26 DIAGNOSIS — M9903 Segmental and somatic dysfunction of lumbar region: Secondary | ICD-10-CM | POA: Diagnosis not present

## 2020-02-26 DIAGNOSIS — M5137 Other intervertebral disc degeneration, lumbosacral region: Secondary | ICD-10-CM | POA: Diagnosis not present

## 2020-02-28 DIAGNOSIS — M9903 Segmental and somatic dysfunction of lumbar region: Secondary | ICD-10-CM | POA: Diagnosis not present

## 2020-02-28 DIAGNOSIS — M5137 Other intervertebral disc degeneration, lumbosacral region: Secondary | ICD-10-CM | POA: Diagnosis not present

## 2020-02-28 DIAGNOSIS — M545 Low back pain, unspecified: Secondary | ICD-10-CM | POA: Diagnosis not present

## 2020-03-04 DIAGNOSIS — M5137 Other intervertebral disc degeneration, lumbosacral region: Secondary | ICD-10-CM | POA: Diagnosis not present

## 2020-03-04 DIAGNOSIS — M9903 Segmental and somatic dysfunction of lumbar region: Secondary | ICD-10-CM | POA: Diagnosis not present

## 2020-03-04 DIAGNOSIS — M545 Low back pain, unspecified: Secondary | ICD-10-CM | POA: Diagnosis not present

## 2020-03-13 DIAGNOSIS — M9903 Segmental and somatic dysfunction of lumbar region: Secondary | ICD-10-CM | POA: Diagnosis not present

## 2020-03-13 DIAGNOSIS — M545 Low back pain, unspecified: Secondary | ICD-10-CM | POA: Diagnosis not present

## 2020-03-13 DIAGNOSIS — M5137 Other intervertebral disc degeneration, lumbosacral region: Secondary | ICD-10-CM | POA: Diagnosis not present

## 2020-03-14 DIAGNOSIS — M545 Low back pain, unspecified: Secondary | ICD-10-CM | POA: Diagnosis not present

## 2020-03-14 DIAGNOSIS — M5137 Other intervertebral disc degeneration, lumbosacral region: Secondary | ICD-10-CM | POA: Diagnosis not present

## 2020-03-14 DIAGNOSIS — M9903 Segmental and somatic dysfunction of lumbar region: Secondary | ICD-10-CM | POA: Diagnosis not present

## 2020-03-18 DIAGNOSIS — M9903 Segmental and somatic dysfunction of lumbar region: Secondary | ICD-10-CM | POA: Diagnosis not present

## 2020-03-18 DIAGNOSIS — M545 Low back pain, unspecified: Secondary | ICD-10-CM | POA: Diagnosis not present

## 2020-03-18 DIAGNOSIS — M5137 Other intervertebral disc degeneration, lumbosacral region: Secondary | ICD-10-CM | POA: Diagnosis not present

## 2020-03-21 DIAGNOSIS — M9903 Segmental and somatic dysfunction of lumbar region: Secondary | ICD-10-CM | POA: Diagnosis not present

## 2020-03-21 DIAGNOSIS — M545 Low back pain, unspecified: Secondary | ICD-10-CM | POA: Diagnosis not present

## 2020-03-21 DIAGNOSIS — M5137 Other intervertebral disc degeneration, lumbosacral region: Secondary | ICD-10-CM | POA: Diagnosis not present

## 2020-03-25 DIAGNOSIS — M9903 Segmental and somatic dysfunction of lumbar region: Secondary | ICD-10-CM | POA: Diagnosis not present

## 2020-03-25 DIAGNOSIS — M5137 Other intervertebral disc degeneration, lumbosacral region: Secondary | ICD-10-CM | POA: Diagnosis not present

## 2020-03-25 DIAGNOSIS — M545 Low back pain, unspecified: Secondary | ICD-10-CM | POA: Diagnosis not present

## 2020-04-02 DIAGNOSIS — M9903 Segmental and somatic dysfunction of lumbar region: Secondary | ICD-10-CM | POA: Diagnosis not present

## 2020-04-02 DIAGNOSIS — M5137 Other intervertebral disc degeneration, lumbosacral region: Secondary | ICD-10-CM | POA: Diagnosis not present

## 2020-04-02 DIAGNOSIS — M545 Low back pain, unspecified: Secondary | ICD-10-CM | POA: Diagnosis not present

## 2020-04-03 ENCOUNTER — Telehealth: Payer: Self-pay | Admitting: *Deleted

## 2020-04-03 NOTE — Telephone Encounter (Signed)
Took call from phone staff and spoke with pt. She is having trouble with balance, walking, vision. Sx started Wed of last week. Sx went away but came back today. Left knee hyperextending like it did on her first relapse. She has been very busy and feels this has contributed. Started Gemtesa Monday last week for bladder spams, prescribed by Roselee Nova at [facility]. No signs/sx of infection. Tender where kidney is located on right side. Feels it is muscle spasms. Placed her on hold and spoke w/ MD. Algis Downs per MD, he would like her to come in for OV. Scheduled urgent work in for 04/04/20 at 2pm, advised her to check in at 1:30pm. She verbalized understanding.

## 2020-04-04 ENCOUNTER — Ambulatory Visit: Payer: BC Managed Care – PPO | Admitting: Neurology

## 2020-04-04 ENCOUNTER — Encounter: Payer: Self-pay | Admitting: Neurology

## 2020-04-04 VITALS — BP 127/69 | HR 114 | Ht 65.0 in | Wt 197.0 lb

## 2020-04-04 DIAGNOSIS — R269 Unspecified abnormalities of gait and mobility: Secondary | ICD-10-CM

## 2020-04-04 DIAGNOSIS — G35 Multiple sclerosis: Secondary | ICD-10-CM | POA: Diagnosis not present

## 2020-04-04 DIAGNOSIS — F418 Other specified anxiety disorders: Secondary | ICD-10-CM

## 2020-04-04 DIAGNOSIS — F909 Attention-deficit hyperactivity disorder, unspecified type: Secondary | ICD-10-CM | POA: Diagnosis not present

## 2020-04-04 DIAGNOSIS — N319 Neuromuscular dysfunction of bladder, unspecified: Secondary | ICD-10-CM

## 2020-04-04 MED ORDER — TOLTERODINE TARTRATE ER 4 MG PO CP24: 4.0000 mg | ORAL_CAPSULE | Freq: Every day | ORAL | 5 refills | Status: DC

## 2020-04-04 NOTE — Progress Notes (Signed)
[facility]  PATIENT: Jean Carlson DOB: 11-Oct-2000  REFERRING DOCTOR OR PCP:  Milus Height, PA SOURCE: Patient, notes from [facility], imaging and lab reports, MRI images personally reviewed.  _________________________________   HISTORICAL  CHIEF COMPLAINT:  Chief Complaint  Patient presents with  . Follow-up    RM 12. Last seen 02/05/20. On Ocrevus for MS. Last infusion: 02/19/20, next: 09/02/20. Having balance, walking, vision changes.    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  Update 04/04/2020: She is on Ocrevus and tolerates it well.   She denies exacerbations though tremors seem worse.  SHe had diplopia off/on the last coupledays while looking right but no left.   She notes more work stress due to short staffing.   She also lost sensation in her legs x 10 minutes.   She noted balance was mildly off this morning but is fine now.   She had shooting pain with electric quality off/on as well.  She feels gait and balance doing better.  She can go upstairs without holding the bannister but holds on downstairs.  Her left arm is slightly weaker, especially in the wrist.   She has tremors only on the left which seem worse if she gets angry/upset.   Also worse with intention.  Fortunately, she is right handed and writing is not affected.     She denies weakness or spasticity.   She has less urinary urgency since starting Myrbetriq    Vision is doing well.  She switched from Myrbetriq to [place] last week.  Myrbetriq helped her bladder initially but then stopped helping.    She feels focus and attention are doing better and she stopped Concerta.   She has some emotional lability and sometimes feels rage.    Prozac had not helped and she stopped.  She is not having crying spells lately (these had been helped by Prozac).    She usually needs 9-10 hours of sleep to feel refreshed.   She falls asleep easily but feels she  gets less deep sleep and wakes up some.   She gets some fatigue but only after a full day's work around 3-6 pm, worse with poor sleep the previous night.     She takes 5000 U daily Vit D.  Level was 23.1.   We discussed healthier diet like the mediterranean.    Stay active and exercise.       MS History: In March 2021, she had the onset of slurred speech,left hand tremor and clumsiness, abnormal eye movements on the right and urinary incontinence.   She went to the ED.    She was admitted and received 5 days of IV Solu-Medrol.   While in the hospital she started to note improvement and she has continued to improve.    In retrospect, in February, she had vertigo x 3 weeks and had several falls, one hyperextending her left knee.   She saw orthopedics.   Also in late 2020, she had a couple weeks of fluctuating numbness in her left hand.  MRI showed enhancing lesions in the pons, medulla and spinal cord (and several other nonenhancing brainstem and spinal cord.   thereflreo she was started on Ocrevus 05/2019.     MRIs of the brain and cervical spine dated 04/09/2019.  The MRI of the brain shows multiple T2/FLAIR hyperintense foci in the hemispheres, brainstem and cerebellum.  2 foci in the pons and medulla enhance  after contrast.  There are also several foci in the hemispheres that enhanced.  There are several foci within the spinal cord.  These are located at C2, more to the right, C3, towards the right and centrally and C4-C5 centrally.  There is subtle enhancement of the C4C5 focus.    There is no family history of MS or other autoimmune diseases.   REVIEW OF SYSTEMS: Constitutional: No fevers, chills, sweats, or change in appetite. Eyes: No visual changes, double vision, eye pain Ear, nose and throat: No hearing loss, ear pain, nasal congestion, sore throat Cardiovascular: No chest pain, palpitations Respiratory: No shortness of breath at rest or with exertion.   No wheezes GastrointestinaI: No  nausea, vomiting, diarrhea, abdominal pain, fecal incontinence Genitourinary: No dysuria, urinary retention or frequency.  No incontinence or nocturia.   Musculoskeletal: No neck pain, back pain Integumentary: No rash, pruritus, skin lesions Neurological: as above Psychiatric: As above Endocrine: No palpitations, diaphoresis, change in appetite, change in weigh or increased thirst Hematologic/Lymphatic: No anemia, purpura, petechiae. Allergic/Immunologic: No itchy/runny eyes, nasal congestion, recent allergic reactions, rashes  ALLERGIES: Allergies  Allergen Reactions  . Amoxicillin Other (See Comments)    CAUSED BAD NIGHTMARES  . Codeine Nausea Only    SEVERE NAUSEA  . Vyvanse [Lisdexamfetamine] Other (See Comments)    "Caused depression to the point of self-harm"    HOME MEDICATIONS:  Current Outpatient Medications:  .  acetaminophen (TYLENOL) 325 MG tablet, Take 2 tablets (650 mg total) by mouth every 6 (six) hours as needed for mild pain (or Fever >/= 101)., Disp:  , Rfl:  .  bisacodyl (DULCOLAX) 5 MG EC tablet, Take 2 tablets (10 mg total) by mouth daily after supper., Disp: 30 tablet, Rfl: 0 .  etonogestrel-ethinyl estradiol (NUVARING) 0.12-0.015 MG/24HR vaginal ring, , Disp: , Rfl:  .  ocrelizumab (OCREVUS) 300 MG/10ML injection, Inject into the vein once., Disp: , Rfl:  .  tolterodine (DETROL LA) 4 MG 24 hr capsule, Take 1 capsule (4 mg total) by mouth daily., Disp: 30 capsule, Rfl: 5  PAST MEDICAL HISTORY: Past Medical History:  Diagnosis Date  . ADHD   . Multiple sclerosis (HCC)     PAST SURGICAL HISTORY: Past Surgical History:  Procedure Laterality Date  . WISDOM TOOTH EXTRACTION     x4    FAMILY HISTORY: Family History  Problem Relation Age of Onset  . Hyperlipidemia Maternal Grandmother   . Hypertension Maternal Grandmother   . Diabetes Paternal Grandfather   . Hypertension Paternal Grandfather   . Heart failure Paternal Grandfather     SOCIAL  HISTORY:  Social History   Socioeconomic History  . Marital status: Single    Spouse name: Not on file  . Number of children: Not on file  . Years of education: Not on file  . 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 Smoking status: Current Every Day Smoker  . Smokeless tobacco: Never Used  Vaping Use  . Vaping Use: Every day  Substance and Sexual Activity  . Alcohol use: Not Currently  . Drug use: Never  . Sexual activity: Not on file  Other Topics Concern  . Not on file  Social History Narrative   Right handed   Caffeine use: tea and soda daily   Social Determinants of Health   Financial Resource Strain: Not on file  Food Insecurity: Not on file  Transportation Needs: Not on  file  Physical Activity: Not on file  Stress: Not on file  Social Connections: Not on file  Intimate Partner Violence: Not on file     PHYSICAL EXAM  Vitals:   04/04/20 1349  BP: 127/69  Pulse: (!) 114  SpO2: 98%  Weight: 197 lb (89.4 kg)  Height: 5\' 5"  (1.651 m)    Body mass index is 32.78 kg/m.  No exam data present  General: The patient is well-developed and well-nourished and in no acute distress  HEENT:  Head is [place]/AT.    Skin: Extremities are without rash or  edema.   Neurologic Exam  Mental status: The patient is alert and oriented x 3 at the time of the examination. The patient has apparent normal recent and remote memory, with an apparently normal attention span and concentration ability.   Speech is normal.  Cranial nerves: Extraocular movements are full.  No nystagmus.  There is good facial sensation to soft touch bilaterally.Facial strength is normal.  Trapezius and sternocleidomastoid strength is normal. No dysarthria is noted.  No obvious hearing deficits are noted.  Motor:  Muscle bulk is normal.   Tone is normal. Strength is  5 / 5 in all 4 extremities.   Sensory: Sensory testing is intact  to soft touch and vibration sensation in arms and mild reduced vibration in left leg.  Coordination: Cerebellar testing reveals normal right and near normal left good finger-nose-finger and heel-to-shin bilaterally.  Gait and station: Station is normal.   Gait is normal.  Tandem gait is mildly wide.  Romberg is negative.   Reflexes: Deep tendon reflexes show spread at the knees and increased at the ankles with nonsustained clonus.        DIAGNOSTIC DATA (LABS, IMAGING, TESTING) - I reviewed patient records, labs, notes, testing and imaging myself where available.  Lab Results  Component Value Date   WBC 6.0 02/05/2020   HGB 15.6 02/05/2020   HCT 46.9 (H) 02/05/2020   MCV 84 02/05/2020   PLT 256 02/05/2020      Component Value Date/Time   NA 139 04/24/2019 0607   K 3.8 04/24/2019 0607   CL 104 04/24/2019 0607   CO2 24 04/24/2019 0607   GLUCOSE 81 04/24/2019 0607   BUN 7 04/24/2019 0607   CREATININE 0.74 04/24/2019 0607   CALCIUM 9.0 04/24/2019 0607   PROT 6.0 (L) 04/18/2019 0511   ALBUMIN 3.5 04/18/2019 0511   AST 30 04/18/2019 0511   ALT 43 04/18/2019 0511   ALKPHOS 51 04/18/2019 0511   BILITOT 1.1 04/18/2019 0511   GFRNONAA >60 04/24/2019 0607   GFRAA >60 04/24/2019 0607    Lab Results  Component Value Date   TSH 2.107 04/09/2019       ASSESSMENT AND PLAN  Multiple sclerosis (HCC) - Plan: MR BRAIN W WO CONTRAST  Attention deficit hyperactivity disorder (ADHD), unspecified ADHD type  Gait disturbance - Plan: MR BRAIN W WO CONTRAST  Depression with anxiety  Neurogenic bladder   1.   Symptoms lasted less than 24 hours and are unlikely to represent a relapse.  Continue Ocrevus.   Check MRI of the brain in a few months to determine if there is any subclinical activity.  If present, consider a different disease modifying therapy. 2.    Continue taking vitamin D.  Try to eat well and exercise as tolerated. 3.   if focus/attention worsens consider a  stimulant.   4.  Add Detrol LA and she will continue  the [place].    I will see her back in 4 months or sooner if there are new or worsening neurologic symptoms.  Around the time of the next visit we will check an MRI of the brain and cervical spine  Daire Okimoto A. Epimenio Foot, MD, [facility] 04/04/2020, 5:42 PM Certified in Neurology, Clinical Neurophysiology, Sleep Medicine and Neuroimaging  [facility] [address] [place], [place] [address] [phone]

## 2020-04-05 DIAGNOSIS — Z124 Encounter for screening for malignant neoplasm of cervix: Secondary | ICD-10-CM | POA: Diagnosis not present

## 2020-04-05 DIAGNOSIS — Z01419 Encounter for gynecological examination (general) (routine) without abnormal findings: Secondary | ICD-10-CM | POA: Diagnosis not present

## 2020-04-08 ENCOUNTER — Telehealth: Payer: Self-pay | Admitting: Neurology

## 2020-04-08 NOTE — Telephone Encounter (Signed)
pt. has [facility] faxed the order they will reach out to the patient to schedule. When she goes through [facility] it is covered at 100% fax # [phone]. Ph # [phone].

## 2020-04-09 NOTE — Telephone Encounter (Signed)
pt scheduled at [facility] in [place] for 07/02/20 at 8AM.

## 2020-04-18 DIAGNOSIS — N390 Urinary tract infection, site not specified: Secondary | ICD-10-CM | POA: Diagnosis not present

## 2020-04-18 DIAGNOSIS — N3941 Urge incontinence: Secondary | ICD-10-CM | POA: Diagnosis not present

## 2020-04-18 DIAGNOSIS — B9620 Unspecified Escherichia coli [E. coli] as the cause of diseases classified elsewhere: Secondary | ICD-10-CM | POA: Diagnosis not present

## 2020-06-05 IMAGING — CT CT HEAD W/O CM
4 series · 15 of 47 positions shown, 17 images · non-contrast
Comparison: None.

CLINICAL DATA: Left-sided neuro deficits

EXAM:
CT HEAD WITHOUT CONTRAST
TECHNIQUE: Contiguous axial images were obtained from the base of the skull
through the vertex without intravenous contrast.

[Series 3: head without · axial · non-contrast · 0.46mm/px · z∈[+1151,+1271]mm · 7 of 34 slices shown, 9 images]
[im 5/34  brain]
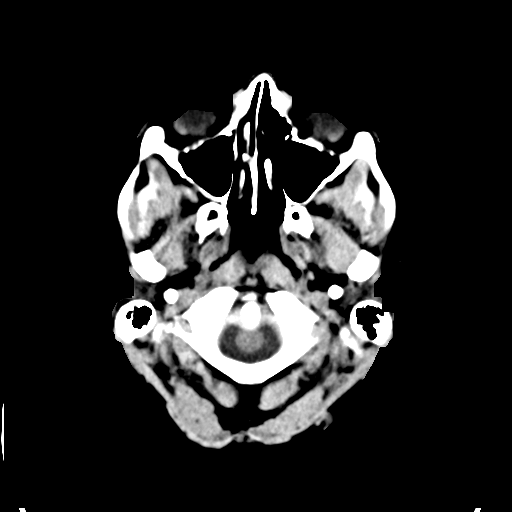
[im 5/34  bone]
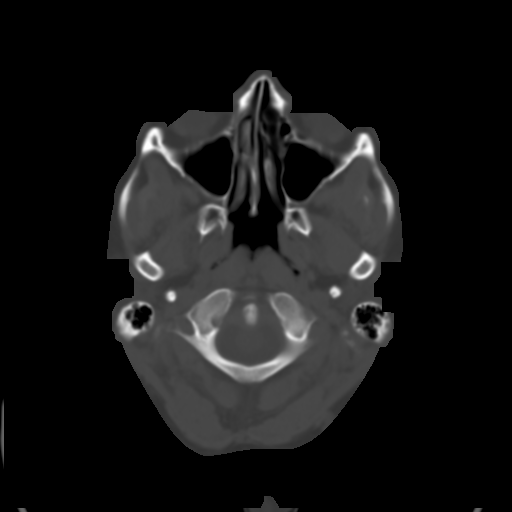
[im 9/34  brain]
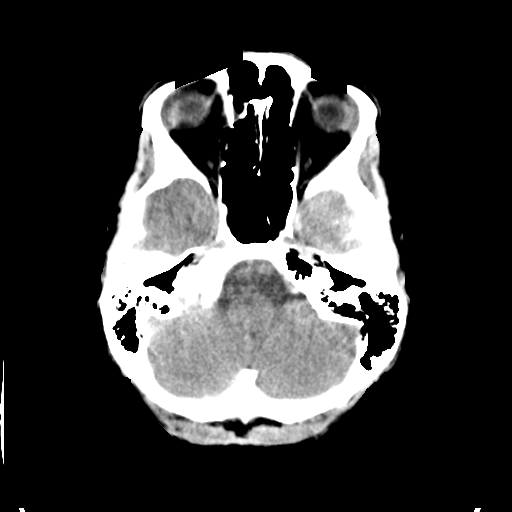
[im 13/34  brain]
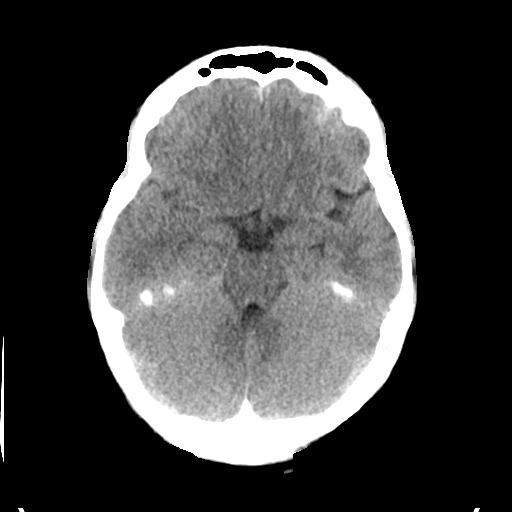
[im 17/34  brain]
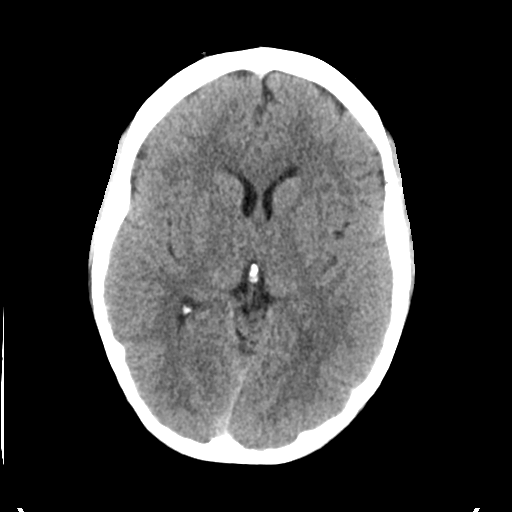
[im 21/34  brain]
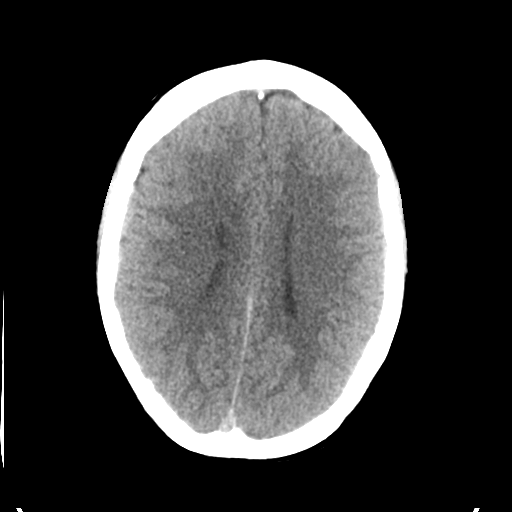
[im 21/34  bone]
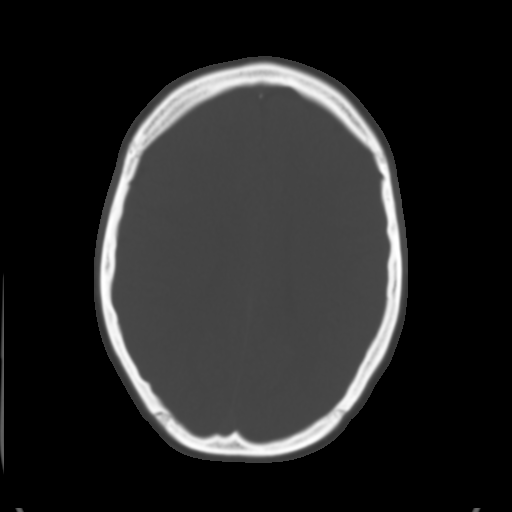
[im 25/34  brain]
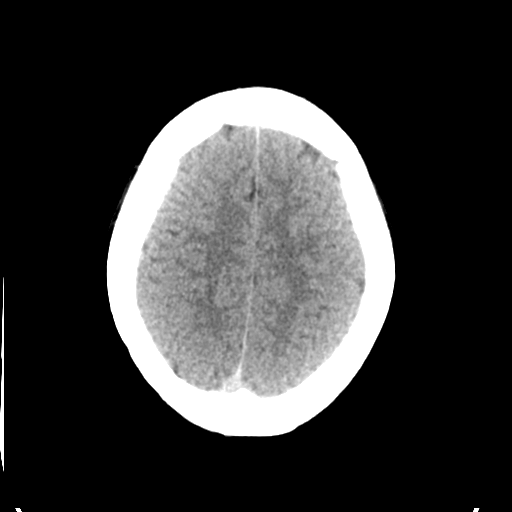
[im 29/34  brain]
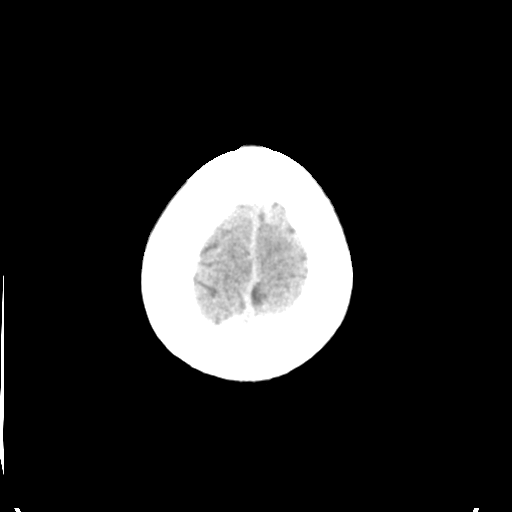

[Series 4: head bone · axial · 0.46mm/px · z∈[+1147,+1163]mm · 2 of 84 slices shown]
[im 9/84  bone]
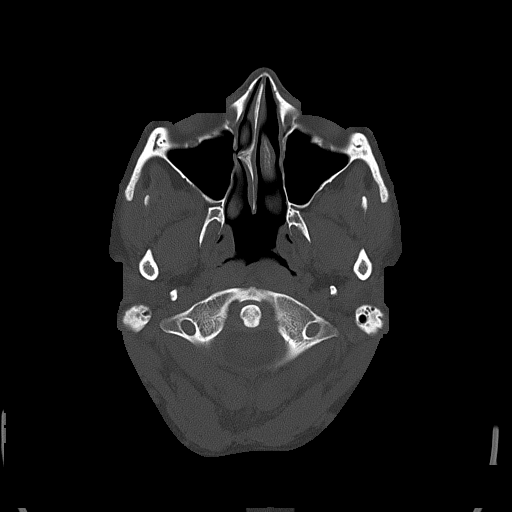
[im 17/84  bone]
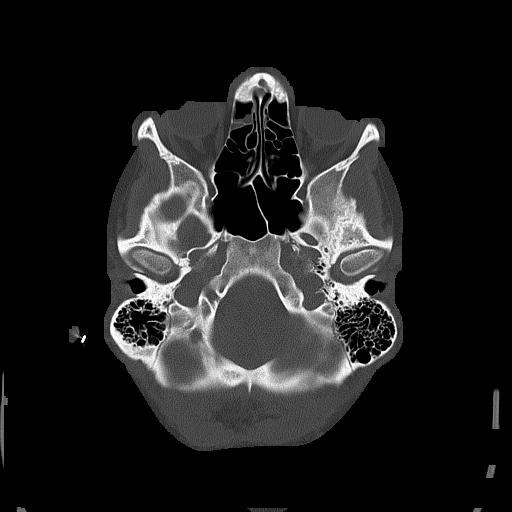

[Series 5: head without cor · coronal · non-contrast · 0.33mm/px · 3 of 69 slices shown]
[im 23/69  brain]
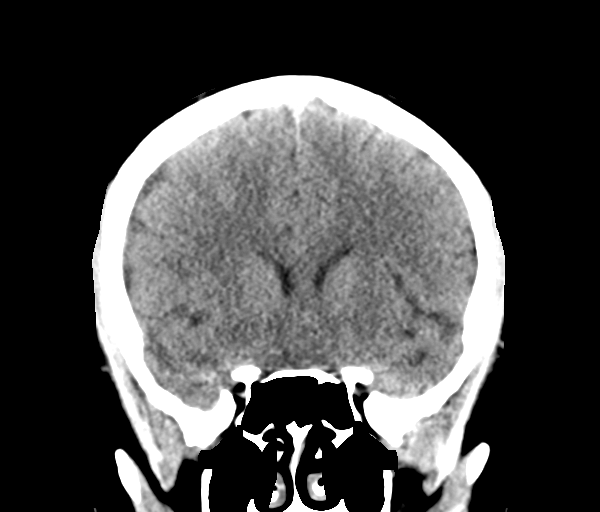
[im 31/69  brain]
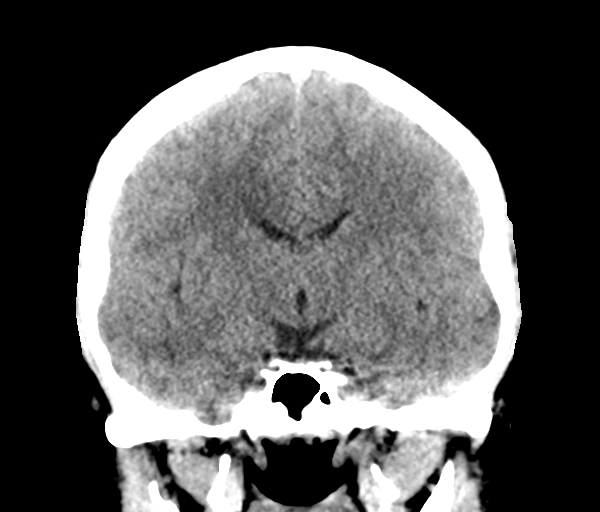
[im 38/69  brain]
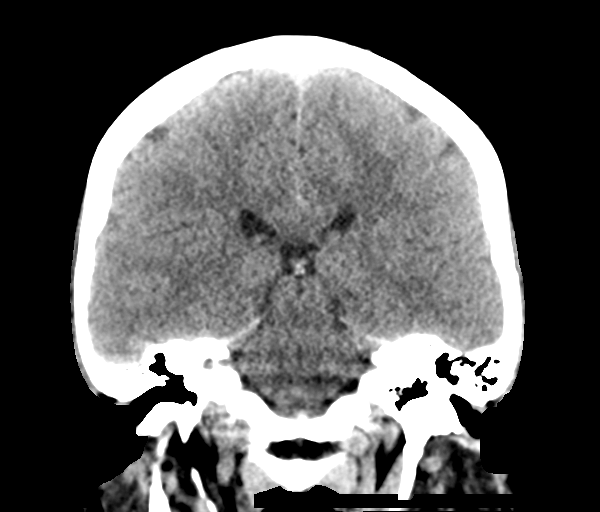

[Series 6: head without sag · sagittal · non-contrast · 0.33mm/px · 3 of 65 slices shown]
[im 22/65  brain]
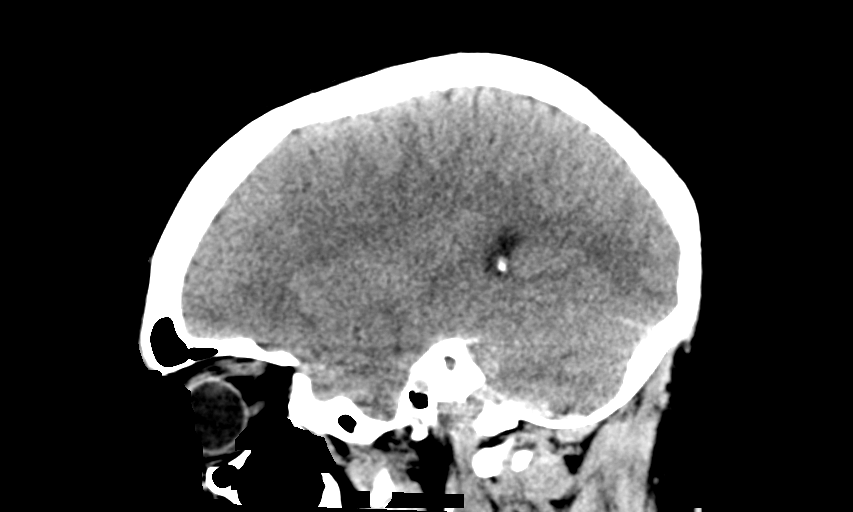
[im 33/65  brain]
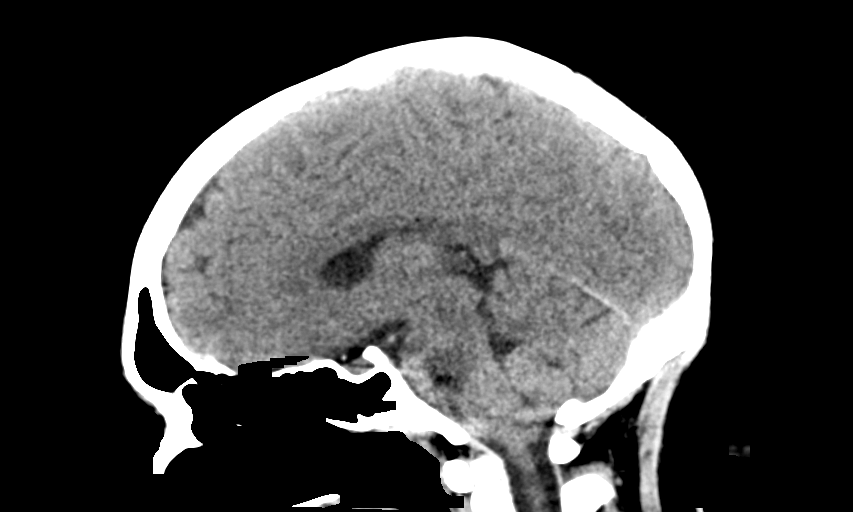
[im 43/65  brain]
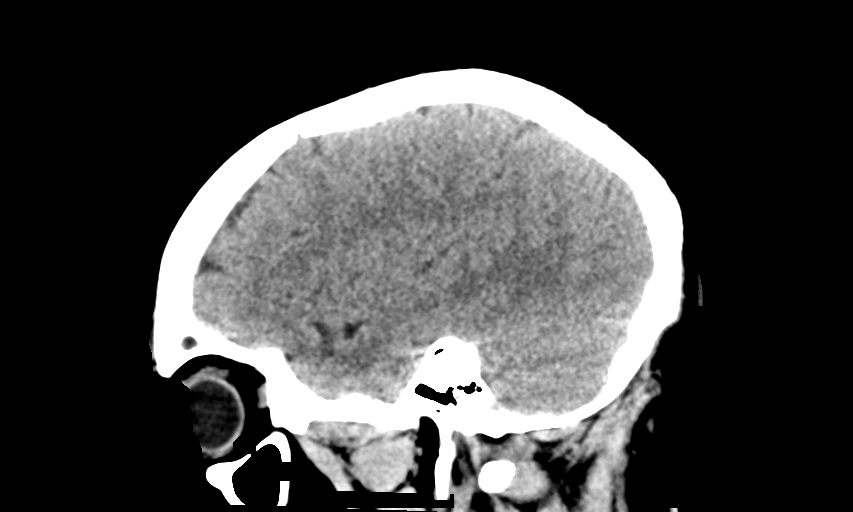

[15 of 47 positions shown; findings below may reference images not displayed]

FINDINGS: Brain: No evidence of acute infarction, hemorrhage, hydrocephalus,
extra-axial collection or mass lesion/mass effect.

Vascular: No hyperdense vessel or unexpected calcification.

Skull: Normal. Negative for fracture or focal lesion.

Sinuses/Orbits: No acute finding.

Other: None.
IMPRESSION: No acute intracranial pathology.

## 2020-06-06 IMAGING — DX DG CHEST 1V PORT
1 series · 1 of 1 positions shown · non-contrast
Comparison: None.

CLINICAL DATA: Fall.  Weakness.

EXAM:
PORTABLE CHEST 1 VIEW

[chest ap]
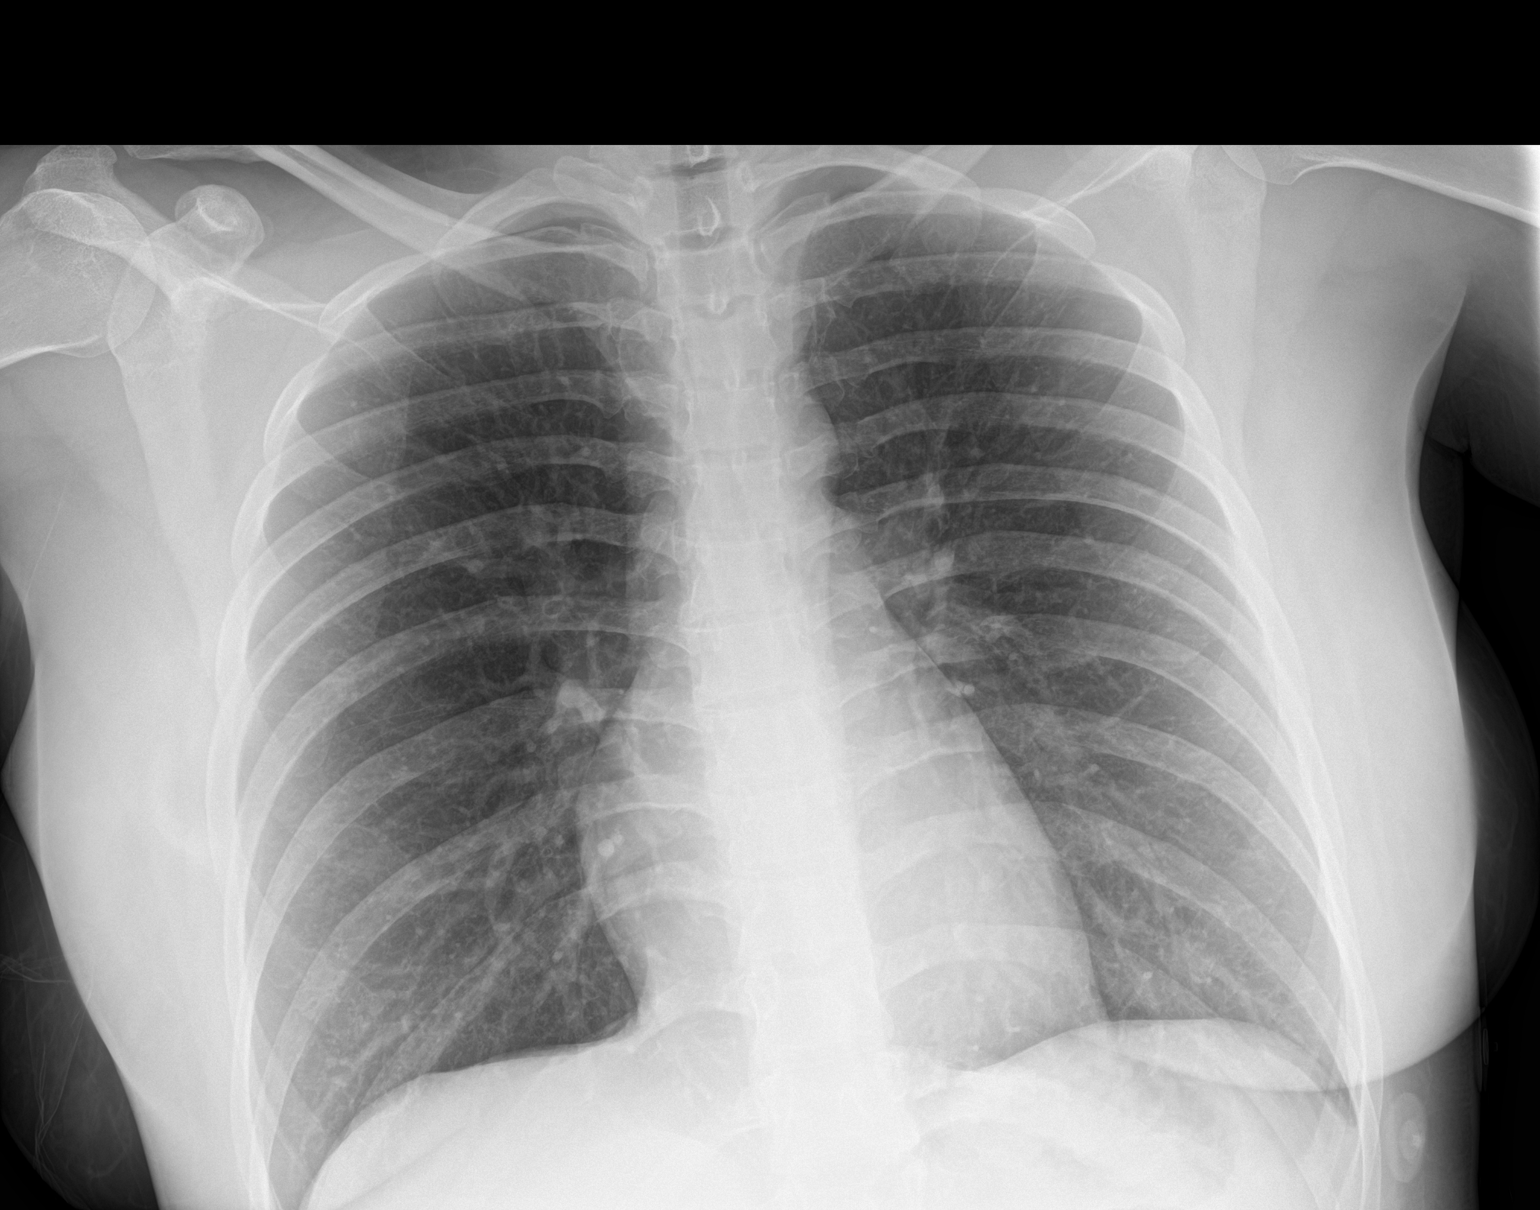

[1 of 1 positions shown; findings below may reference images not displayed]

FINDINGS: The heart size and mediastinal contours are within normal limits.
Both lungs are clear. The visualized skeletal structures are
unremarkable.
IMPRESSION: No active disease.

## 2020-07-11 DIAGNOSIS — G35 Multiple sclerosis: Secondary | ICD-10-CM | POA: Diagnosis not present

## 2020-07-11 DIAGNOSIS — G9389 Other specified disorders of brain: Secondary | ICD-10-CM | POA: Diagnosis not present

## 2020-07-15 ENCOUNTER — Telehealth: Payer: Self-pay | Admitting: *Deleted

## 2020-07-15 NOTE — Telephone Encounter (Signed)
Called and spoke w/ patient about results per Dr. Bonnita Hollow note. Pt verbalized understanding. Reminded pt her next appt is 08/08/20 at 830am.

## 2020-07-15 NOTE — Telephone Encounter (Signed)
-----   Message from Asa Lente, MD sent at 07/15/2020  2:33 PM EDT ----- Regarding: MRI Please let her know that I took a look at the MRI she did a couple days ago and compared it to the one from last year.  The MRI looks much better.  She has no new lesions in any of old lesions looks more now than they did last year.  They do not have any inflammation like they did last year.

## 2020-08-05 DIAGNOSIS — Z0289 Encounter for other administrative examinations: Secondary | ICD-10-CM

## 2020-08-08 ENCOUNTER — Encounter: Payer: Self-pay | Admitting: Neurology

## 2020-08-08 ENCOUNTER — Ambulatory Visit: Payer: BC Managed Care – PPO | Admitting: Neurology

## 2020-08-08 ENCOUNTER — Telehealth: Payer: Self-pay | Admitting: *Deleted

## 2020-08-08 VITALS — BP 126/80 | HR 89 | Ht 65.0 in | Wt 186.0 lb

## 2020-08-08 DIAGNOSIS — R269 Unspecified abnormalities of gait and mobility: Secondary | ICD-10-CM

## 2020-08-08 DIAGNOSIS — E559 Vitamin D deficiency, unspecified: Secondary | ICD-10-CM

## 2020-08-08 DIAGNOSIS — Z79899 Other long term (current) drug therapy: Secondary | ICD-10-CM

## 2020-08-08 DIAGNOSIS — G35 Multiple sclerosis: Secondary | ICD-10-CM

## 2020-08-08 DIAGNOSIS — N319 Neuromuscular dysfunction of bladder, unspecified: Secondary | ICD-10-CM | POA: Diagnosis not present

## 2020-08-08 NOTE — Telephone Encounter (Signed)
Gave completed/signed FMLA back to medical records to process. FMLA for pt father. Dr. Epimenio Foot providing:  2-3 hr for office visits, 4 hours for infusions. Intermittent leave: 4x/yrx7 days per episode

## 2020-08-08 NOTE — Progress Notes (Signed)
[facility]  PATIENT: Jean Carlson DOB: 2000/05/20  REFERRING DOCTOR OR PCP:  Milus Height, PA SOURCE: Patient, notes from [facility], imaging and lab reports, MRI images personally reviewed.  _________________________________   HISTORICAL  CHIEF COMPLAINT:  Chief Complaint  Patient presents with   Follow-up    Rm 1, alone. Here for 6 month MS f/u. Taking Ocrevus, last infusion 1/24, next infusion 8/8. Pt reports doing well, no new or worsening sx. Pt has had 2 HA on the right frontal lobe, pt did take an antihistamine. Pt unsure if its allergy related.     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  Update 08/08/2020: She is on Ocrevus and tolerates it well.  Last infusion was 02/19/20 and next one is 09/02/2020.    She denies exacerbations since last visit and is fairly stable.     Gait and balance are doing better though not baseline.   She will ise the banister if she needs to hold items with the other hand.    No falls.   Her left arm is slightly weaker, especially in the wrist.   She has some reduced keyboarding due to a mild left tremor.    Fortunately, she is right handed and writing is not affected.       She has less urinary urgency than last year and she is off all bladder med's.      She has mild diplopia.   Vision is doing well.  Focus and attention are doing better and she is not using Concerta.   She has some emotional lability and sometimes feels rage.     Prozac had not helped and she stopped.  She has rare crying spells .  No inappropriate laughter.  She usually needs 9-10 hours of sleep to feel refreshed.   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    The [employer]iece has [facility] but the warehouse does not and summers are tough.     She takes 5000 U daily Vit D.  Level was 23.1.   We discussed healthier diet like the Mediterranean.    Stay has an active job and walks  7500 steps or so many day  MS History: In March 2021, she had the onset of slurred speech,left hand tremor and clumsiness, abnormal eye movements on the right and urinary incontinence.   She went to the ED.    She was admitted and received 5 days of IV Solu-Medrol.   While in the hospital she started to note improvement and she has continued to improve.    In retrospect, in February 2021, she had vertigo x 3 weeks and had several falls, one hyperextending her left knee.   She saw orthopedics.   Also in late 2020, she had a couple weeks of fluctuating numbness in her left hand.  MRI showed enhancing lesions in the pons, medulla and spinal cord (and several other nonenhancing brainstem and spinal cord.   She was started on Ocrevus 05/2019.     MRIs of the brain and cervical spine dated 04/09/2019.  The MRI of the brain shows multiple T2/FLAIR hyperintense foci in the hemispheres, brainstem and cerebellum.  2 foci in the pons and medulla enhance after contrast.  There are also several foci in the hemispheres that enhanced.  There are several foci within the spinal cord.  These are located at C2, more  to the right, C3, towards the right and centrally and C4-C5 centrally.  There is subtle enhancement of the C4C5 focus.    MRI of the brain 07/11/2020 showed multiple T2/FLAIR hyperintense foci in the medulla and pons.  Additional foci were noted in the periventricular and deep white matter.  None of the foci enhance.  Compared to the MRI from 04/09/2019, there were no new lesions and the many enhancing lesions on the previous scan no longer enhance and many are smaller in size.  There is no family history of MS or other autoimmune diseases.   REVIEW OF SYSTEMS: Constitutional: No fevers, chills, sweats, or change in appetite. Eyes: No visual changes, double vision, eye pain Ear, nose and throat: No hearing loss, ear pain, nasal congestion, sore throat Cardiovascular: No chest pain, palpitations Respiratory:  No  shortness of breath at rest or with exertion.   No wheezes GastrointestinaI: No nausea, vomiting, diarrhea, abdominal pain, fecal incontinence Genitourinary:  No dysuria, urinary retention or frequency.  No incontinence or nocturia.   Musculoskeletal:  No neck pain, back pain Integumentary: No rash, pruritus, skin lesions Neurological: as above Psychiatric: As above Endocrine: No palpitations, diaphoresis, change in appetite, change in weigh or increased thirst Hematologic/Lymphatic:  No anemia, purpura, petechiae. Allergic/Immunologic: No itchy/runny eyes, nasal congestion, recent allergic reactions, rashes  ALLERGIES: Allergies  Allergen Reactions   Amoxicillin Other (See Comments)    CAUSED BAD NIGHTMARES   Codeine Nausea Only    SEVERE NAUSEA   Vyvanse [Lisdexamfetamine] Other (See Comments)    "Caused depression to the point of self-harm"    HOME MEDICATIONS:  Current Outpatient Medications:    acetaminophen (TYLENOL) 325 MG tablet, Take 2 tablets (650 mg total) by mouth every 6 (six) hours as needed for mild pain (or Fever >/= 101)., Disp:  , Rfl:    bisacodyl (DULCOLAX) 5 MG EC tablet, Take 2 tablets (10 mg total) by mouth daily after supper., Disp: 30 tablet, Rfl: 0   etonogestrel-ethinyl estradiol (NUVARING) 0.12-0.015 MG/24HR vaginal ring, , Disp: , Rfl:    ocrelizumab (OCREVUS) 300 MG/10ML injection, Inject into the vein once., Disp: , Rfl:   PAST MEDICAL HISTORY: Past Medical History:  Diagnosis Date   ADHD    Multiple sclerosis (HCC)     PAST SURGICAL HISTORY: Past Surgical History:  Procedure Laterality Date   WISDOM TOOTH EXTRACTION     x4    FAMILY HISTORY: Family History  Problem Relation Age of Onset   Hyperlipidemia Maternal Grandmother    Hypertension Maternal Grandmother    Diabetes Paternal Grandfather    Hypertension Paternal Grandfather    Heart failure Paternal Grandfather     SOCIAL HISTORY:  Social History   Socioeconomic History    Marital status: Single    Spouse name: Not on file   Number of children: Not on file   Years of education: Not on file   Highest education level: Not on file  Occupational History    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  Tobacco Use   Smoking status: Every Day   Smokeless tobacco: Never  Vaping Use   Vaping Use: Every day  Substance and Sexual Activity   Alcohol use: Not Currently   Drug use: Never   Sexual activity: Not on file  Other Topics Concern   Not on file  Social History Narrative   Right handed   Caffeine use: tea and soda daily   Social Determinants of Health   Financial Resource Strain: Not on  file  Food Insecurity: Not on file  Transportation Needs: Not on file  Physical Activity: Not on file  Stress: Not on file  Social Connections: Not on file  Intimate Partner Violence: Not on file     PHYSICAL EXAM  Vitals:   08/08/20 0827  BP: 126/80  Pulse: 89  Weight: 186 lb (84.4 kg)  Height: 5\' 5"  (1.651 m)    Body mass index is 30.95 kg/m.  No results found.  General: The patient is well-developed and well-nourished and in no acute distress  HEENT:  Head is [place]/AT.    Skin: Extremities are without rash or  edema.   Neurologic Exam  Mental status: The patient is alert and oriented x 3 at the time of the examination. The patient has apparent normal recent and remote memory, with an apparently normal attention span and concentration ability.   Speech is normal.  Cranial nerves: Extraocular movements are full though she noted slight diplopia on far right gaze.[name]  No nystagmus.  There is good facial sensation to soft touch bilaterally.Facial strength is normal.  Trapezius and sternocleidomastoid strength is normal. No dysarthria is noted.  No obvious hearing deficits are noted.  Motor:  Muscle bulk is normal.  Very slight tremor on the left tone is normal. Strength is  5 / 5 in all 4 extremities.   Sensory: Sensory testing is intact to soft touch and  vibration sensation in arms and mild reduced vibration in left leg.  Coordination: Finger-nose-finger is slightly reduced in the left arm versus the right arm.  Heel-to-shin was performed fairly well.     Gait and station: Station is normal.   Gait is normal.  Tandem gait is mildly wide.  Romberg is negative.   Reflexes: Deep tendon reflexes show spread at the knees and increased at the ankles with nonsustained clonus.        DIAGNOSTIC DATA (LABS, IMAGING, TESTING) - I reviewed patient records, labs, notes, testing and imaging myself where available.  Lab Results  Component Value Date   WBC 6.0 02/05/2020   HGB 15.6 02/05/2020   HCT 46.9 (H) 02/05/2020   MCV 84 02/05/2020   PLT 256 02/05/2020      Component Value Date/Time   NA 139 04/24/2019 0607   K 3.8 04/24/2019 0607   CL 104 04/24/2019 0607   CO2 24 04/24/2019 0607   GLUCOSE 81 04/24/2019 0607   BUN 7 04/24/2019 0607   CREATININE 0.74 04/24/2019 0607   CALCIUM 9.0 04/24/2019 0607   PROT 6.0 (L) 04/18/2019 0511   ALBUMIN 3.5 04/18/2019 0511   AST 30 04/18/2019 0511   ALT 43 04/18/2019 0511   ALKPHOS 51 04/18/2019 0511   BILITOT 1.1 04/18/2019 0511   GFRNONAA >60 04/24/2019 0607   GFRAA >60 04/24/2019 0607    Lab Results  Component Value Date   TSH 2.107 04/09/2019       ASSESSMENT AND PLAN  Multiple sclerosis (HCC) - Plan: IgG, IgA, IgM, CBC with Differential/Platelet  High risk medication use - Plan: IgG, IgA, IgM, CBC with Differential/Platelet  Gait disturbance  Neurogenic bladder  Vitamin D deficiency   1.   Symptoms appear stable.  Continue Ocrevus.  Next infusion 09/02/2020.  We will check some blood work today (IgG/IgM/IgA and CBC/D).   Check MRI of the brain in spring 2023 to determine if there is any subclinical activity.  If present, consider a different disease modifying therapy. 2.    Continue taking vitamin D.  Try to eat well and exercise as tolerated. 3.    if focus/attention worsens  consider a stimulant.   4.     We will see her back in 6 months or sooner if there are new or worsening neurologic symptoms.  Around the time of the next visit we will check an MRI of the brain and cervical spine  Sabrina Arriaga A. Epimenio Foot, MD, [facility] 08/08/2020, 9:09 AM Certified in Neurology, Clinical Neurophysiology, Sleep Medicine and Neuroimaging  [facility] [address] [place], [place] [address] [phone]

## 2020-08-09 LAB — CBC WITH DIFFERENTIAL/PLATELET
Basophils Absolute: 0 10*3/uL (ref 0.0–0.2)
Basos: 0 %
EOS (ABSOLUTE): 0.1 10*3/uL (ref 0.0–0.4)
Eos: 2 %
Hematocrit: 39.2 % (ref 34.0–46.6)
Hemoglobin: 12.9 g/dL (ref 11.1–15.9)
Immature Grans (Abs): 0 10*3/uL (ref 0.0–0.1)
Immature Granulocytes: 0 %
Lymphocytes Absolute: 1.1 10*3/uL (ref 0.7–3.1)
Lymphs: 18 %
MCH: 26.2 pg — ABNORMAL LOW (ref 26.6–33.0)
MCHC: 32.9 g/dL (ref 31.5–35.7)
MCV: 80 fL (ref 79–97)
Monocytes Absolute: 0.5 10*3/uL (ref 0.1–0.9)
Monocytes: 9 %
Neutrophils Absolute: 4.4 10*3/uL (ref 1.4–7.0)
Neutrophils: 71 %
Platelets: 342 10*3/uL (ref 150–450)
RBC: 4.93 x10E6/uL (ref 3.77–5.28)
RDW: 13.7 % (ref 11.7–15.4)
WBC: 6.2 10*3/uL (ref 3.4–10.8)

## 2020-08-09 LAB — IGG, IGA, IGM
IgA/Immunoglobulin A, Serum: 370 mg/dL — ABNORMAL HIGH (ref 87–352)
IgG (Immunoglobin G), Serum: 1317 mg/dL (ref 586–1602)
IgM (Immunoglobulin M), Srm: 29 mg/dL (ref 26–217)

## 2020-08-12 ENCOUNTER — Telehealth: Payer: Self-pay

## 2020-08-12 NOTE — Progress Notes (Signed)
See telephone note from @TODAY @.

## 2020-08-12 NOTE — Telephone Encounter (Signed)
Called and LVM of results per DPR.

## 2020-08-12 NOTE — Telephone Encounter (Signed)
-----   Message from Asa Lente, MD sent at 08/09/2020  1:19 PM EDT ----- Please let the patient know that the lab work is fine.

## 2020-08-12 NOTE — Progress Notes (Signed)
Called and LVM with results per DPR.

## 2020-08-29 DIAGNOSIS — G35 Multiple sclerosis: Secondary | ICD-10-CM | POA: Diagnosis not present

## 2020-09-02 DIAGNOSIS — Z0289 Encounter for other administrative examinations: Secondary | ICD-10-CM

## 2020-09-05 ENCOUNTER — Telehealth: Payer: Self-pay | Admitting: *Deleted

## 2020-09-05 NOTE — Telephone Encounter (Signed)
Gave completed/signed FMLA for pt and her job to medical records to process for pt.

## 2020-09-17 DIAGNOSIS — M9903 Segmental and somatic dysfunction of lumbar region: Secondary | ICD-10-CM | POA: Diagnosis not present

## 2020-09-17 DIAGNOSIS — M5137 Other intervertebral disc degeneration, lumbosacral region: Secondary | ICD-10-CM | POA: Diagnosis not present

## 2020-09-17 DIAGNOSIS — M545 Low back pain, unspecified: Secondary | ICD-10-CM | POA: Diagnosis not present

## 2020-09-19 DIAGNOSIS — M5137 Other intervertebral disc degeneration, lumbosacral region: Secondary | ICD-10-CM | POA: Diagnosis not present

## 2020-09-19 DIAGNOSIS — M545 Low back pain, unspecified: Secondary | ICD-10-CM | POA: Diagnosis not present

## 2020-09-19 DIAGNOSIS — M9903 Segmental and somatic dysfunction of lumbar region: Secondary | ICD-10-CM | POA: Diagnosis not present

## 2020-09-23 DIAGNOSIS — M545 Low back pain, unspecified: Secondary | ICD-10-CM | POA: Diagnosis not present

## 2020-09-23 DIAGNOSIS — M5137 Other intervertebral disc degeneration, lumbosacral region: Secondary | ICD-10-CM | POA: Diagnosis not present

## 2020-09-23 DIAGNOSIS — M9903 Segmental and somatic dysfunction of lumbar region: Secondary | ICD-10-CM | POA: Diagnosis not present

## 2020-09-25 DIAGNOSIS — M5137 Other intervertebral disc degeneration, lumbosacral region: Secondary | ICD-10-CM | POA: Diagnosis not present

## 2020-09-25 DIAGNOSIS — M9903 Segmental and somatic dysfunction of lumbar region: Secondary | ICD-10-CM | POA: Diagnosis not present

## 2020-09-25 DIAGNOSIS — M545 Low back pain, unspecified: Secondary | ICD-10-CM | POA: Diagnosis not present

## 2020-10-01 DIAGNOSIS — M9903 Segmental and somatic dysfunction of lumbar region: Secondary | ICD-10-CM | POA: Diagnosis not present

## 2020-10-01 DIAGNOSIS — M545 Low back pain, unspecified: Secondary | ICD-10-CM | POA: Diagnosis not present

## 2020-10-01 DIAGNOSIS — M5137 Other intervertebral disc degeneration, lumbosacral region: Secondary | ICD-10-CM | POA: Diagnosis not present

## 2020-11-22 DIAGNOSIS — N941 Unspecified dyspareunia: Secondary | ICD-10-CM | POA: Diagnosis not present

## 2020-12-03 ENCOUNTER — Other Ambulatory Visit: Payer: Self-pay

## 2020-12-03 ENCOUNTER — Ambulatory Visit (HOSPITAL_COMMUNITY)
Admission: EM | Admit: 2020-12-03 | Discharge: 2020-12-03 | Disposition: A | Discharge status: Home / Self Care | Payer: BC Managed Care – PPO | Attending: Urgent Care | Admitting: Urgent Care

## 2020-12-03 ENCOUNTER — Encounter (HOSPITAL_COMMUNITY): Payer: Self-pay

## 2020-12-03 DIAGNOSIS — M79645 Pain in left finger(s): Secondary | ICD-10-CM | POA: Diagnosis not present

## 2020-12-03 DIAGNOSIS — Z23 Encounter for immunization: Secondary | ICD-10-CM

## 2020-12-03 DIAGNOSIS — T07XXXA Unspecified multiple injuries, initial encounter: Secondary | ICD-10-CM | POA: Diagnosis not present

## 2020-12-03 MED ORDER — TETANUS-DIPHTH-ACELL PERTUSSIS 5-2.5-18.5 LF-MCG/0.5 IM SUSY
PREFILLED_SYRINGE | INTRAMUSCULAR | Status: AC
  Filled 2020-12-03: qty 0.5

## 2020-12-03 MED ORDER — CEPHALEXIN 500 MG PO CAPS: 500.0000 mg | ORAL_CAPSULE | Freq: Three times a day (TID) | ORAL | 0 refills | Status: DC

## 2020-12-03 MED ORDER — TETANUS-DIPHTH-ACELL PERTUSSIS 5-2.5-18.5 LF-MCG/0.5 IM SUSY
0.5000 mL | PREFILLED_SYRINGE | Freq: Once | INTRAMUSCULAR | Status: AC
  Administered 2020-12-03: 0.5 mL via INTRAMUSCULAR

## 2020-12-03 MED ORDER — NAPROXEN 500 MG PO TABS: 500.0000 mg | ORAL_TABLET | Freq: Two times a day (BID) | ORAL | 0 refills | Status: DC

## 2020-12-03 MED ORDER — LIDOCAINE HCL 2 % IJ SOLN
INTRAMUSCULAR | Status: AC
  Filled 2020-12-03: qty 20

## 2020-12-03 MED ORDER — LIDOCAINE-EPINEPHRINE-TETRACAINE (LET) TOPICAL GEL
TOPICAL | Status: AC
  Filled 2020-12-03: qty 3

## 2020-12-03 NOTE — ED Provider Notes (Signed)
[facility]   MRN: [id number] DOB: 05/02/2000  Subjective:   Jean Carlson is a 20 y.o. female presenting for 1 day history of acute onset left index finger and thumb pain.  Patient suffered a laceration because she was trying to cut some herbs.  She used peroxide over the wound and then applied cornstarch and superglue.  States that she refused to have stitches done but her boyfriend forced her to come in today.  Tdap is not up-to-date.  She has had some drainage and weeping from both of her wounds.  Has had significant nerve type pain over the left index finger.   Current Facility-Administered Medications:    Tdap (BOOSTRIX) injection 0.5 mL, 0.5 mL, Intramuscular, Once, [place], [place]  Current Outpatient Medications:    acetaminophen (TYLENOL) 325 MG tablet, Take 2 tablets (650 mg total) by mouth every 6 (six) hours as needed for mild pain (or Fever >/= 101)., Disp:  , Rfl:    bisacodyl (DULCOLAX) 5 MG EC tablet, Take 2 tablets (10 mg total) by mouth daily after supper., Disp: 30 tablet, Rfl: 0   etonogestrel-ethinyl estradiol (NUVARING) 0.12-0.015 MG/24HR vaginal ring, , Disp: , Rfl:    ocrelizumab (OCREVUS) 300 MG/10ML injection, Inject into the vein once., Disp: , Rfl:    Allergies  Allergen Reactions   Amoxicillin Other (See Comments)    CAUSED BAD NIGHTMARES   Codeine Nausea Only    SEVERE NAUSEA   Vyvanse [Lisdexamfetamine] Other (See Comments)    "Caused depression to the point of self-harm"    Past Medical History:  Diagnosis Date   ADHD    Multiple sclerosis (HCC)      Past Surgical History:  Procedure Laterality Date   WISDOM TOOTH EXTRACTION     x4    Family History  Problem Relation Age of Onset   Hyperlipidemia Maternal Grandmother    Hypertension Maternal Grandmother    Diabetes Paternal Grandfather    Hypertension Paternal Grandfather    Heart failure Paternal Grandfather     Social History   Tobacco Use   Smoking status:  Every Day   Smokeless tobacco: Never  Vaping Use   Vaping Use: Every day  Substance Use Topics   Alcohol use: Not Currently   Drug use: Never    ROS   Objective:   Vitals: BP 122/79 (BP Location: Right Arm)   Pulse 86   Temp 98.7 F (37.1 C) (Oral)   Resp 18   LMP 10/05/2018 (Exact Date)   SpO2 99%   Physical Exam Constitutional:      General: She is not in acute distress.    Appearance: Normal appearance. She is well-developed. She is not ill-appearing, toxic-appearing or diaphoretic.  HENT:     Head: Normocephalic and atraumatic.     Nose: Nose normal.     Mouth/Throat:     Mouth: Mucous membranes are moist.     Pharynx: Oropharynx is clear.  Eyes:     General: No scleral icterus.    Extraocular Movements: Extraocular movements intact.     Pupils: Pupils are equal, round, and reactive to light.  Cardiovascular:     Rate and Rhythm: Normal rate.  Pulmonary:     Effort: Pulmonary effort is normal.  Musculoskeletal:       Hands:     Comments: Full range of motion.  Sensation intact.  Skin:    General: Skin is warm and dry.  Neurological:  General: No focal deficit present.     Mental Status: She is alert and oriented to person, place, and time.  Psychiatric:        Mood and Affect: Mood normal.        Behavior: Behavior normal.        Thought Content: Thought content normal.        Judgment: Judgment normal.    Assessment and Plan :   PDMP not reviewed this encounter.  1. Pain of left thumb   2. Finger pain, left   3. Multiple lacerations     Wound cleansed, cornstarch and superglue removed as much as possible.  Applied let to the wounds.  This was subsequently dried off and applied Steri-Strips to either wound.  Secured with nonadherent and Coban.  Tdap updated.  Use naproxen for pain and inflammation.  We will address wound infection with Keflex.  Follow-up with [facility] for recheck on her wound of her hand. Counseled patient on potential  for adverse effects with medications prescribed/recommended today, ER and return-to-clinic precautions discussed, patient verbalized understanding.    Wallis Bamberg, PA-C 12/03/20 1319

## 2020-12-03 NOTE — ED Triage Notes (Signed)
Pt presents with 2 laceration to the L hand. States she was cutting herbs and cut her hand in two places. Pt states she applied corn starch and then super glue on it. States her the index finger became numb last night.

## 2020-12-03 NOTE — Discharge Instructions (Signed)
Change your dressing 3-5 times daily, cleaning the wound each time. Use warm soapy water to removed any medical tape or anything that adheres to the finger lacerations. Use non-stick dressing over the medical tape, then secure Coban. Contact [facility] for a consultation regarding nerve damage.

## 2020-12-03 NOTE — ED Triage Notes (Signed)
States last Tetanus shot was given in 2012.

## 2021-01-13 DIAGNOSIS — Z13220 Encounter for screening for lipoid disorders: Secondary | ICD-10-CM | POA: Diagnosis not present

## 2021-01-13 DIAGNOSIS — Z131 Encounter for screening for diabetes mellitus: Secondary | ICD-10-CM | POA: Diagnosis not present

## 2021-01-13 DIAGNOSIS — Z0000 Encounter for general adult medical examination without abnormal findings: Secondary | ICD-10-CM | POA: Diagnosis not present

## 2021-01-22 ENCOUNTER — Encounter (HOSPITAL_COMMUNITY): Payer: Self-pay | Admitting: Emergency Medicine

## 2021-01-22 ENCOUNTER — Emergency Department (HOSPITAL_COMMUNITY)
Admission: EM | Admit: 2021-01-22 | Discharge: 2021-01-23 | Disposition: A | Discharge status: Home / Self Care | Payer: BC Managed Care – PPO | Attending: Emergency Medicine | Admitting: Emergency Medicine

## 2021-01-22 DIAGNOSIS — H6692 Otitis media, unspecified, left ear: Secondary | ICD-10-CM | POA: Insufficient documentation

## 2021-01-22 DIAGNOSIS — L089 Local infection of the skin and subcutaneous tissue, unspecified: Secondary | ICD-10-CM

## 2021-01-22 DIAGNOSIS — F17200 Nicotine dependence, unspecified, uncomplicated: Secondary | ICD-10-CM | POA: Insufficient documentation

## 2021-01-22 DIAGNOSIS — S00452A Superficial foreign body of left ear, initial encounter: Secondary | ICD-10-CM

## 2021-01-22 DIAGNOSIS — T162XXA Foreign body in left ear, initial encounter: Secondary | ICD-10-CM | POA: Diagnosis not present

## 2021-01-22 DIAGNOSIS — H9202 Otalgia, left ear: Secondary | ICD-10-CM | POA: Diagnosis not present

## 2021-01-22 NOTE — ED Provider Notes (Signed)
Emergency Medicine Provider Triage Evaluation Note  Jean Carlson , a 20 y.o. female  was evaluated in triage.  Pt complains of left ear foreign body.  She states that the front (jewel) of her earring is embedded in her ear.  She cannot get it out.  She reports pain with movement or manipulation of the ear.  She states that it has been draining some pus..  Review of Systems  Positive: Foreign body, ear pain Negative: Fever, chills  Physical Exam  BP 128/83 (BP Location: Right Arm)    Pulse 75    Temp 98.5 F (36.9 C) (Oral)    Resp 17    SpO2 100%  Gen:   Awake, no distress   Resp:  Normal effort  MSK:   Moves extremities without difficulty  Other:  Left ear FB  Medical Decision Making  Medically screening exam initiated at 11:15 PM.  Appropriate orders placed.  Jean Carlson was informed that the remainder of the evaluation will be completed by another provider, this initial triage assessment does not replace that evaluation, and the importance of remaining in the ED until their evaluation is complete.  Left ear FB   Roxy Horseman, PA-C 01/22/21 2316    Charlynne Pander, MD 01/24/21 1210

## 2021-01-22 NOTE — ED Triage Notes (Signed)
Patient reports left outer ear pain with swelling infected from an earring onset yesterday .

## 2021-01-23 LAB — POC URINE PREG, ED: Preg Test, Ur: NEGATIVE

## 2021-01-23 MED ORDER — OXYCODONE-ACETAMINOPHEN 5-325 MG PO TABS: 1.0000 | ORAL_TABLET | Freq: Four times a day (QID) | ORAL | 0 refills | Status: DC | PRN

## 2021-01-23 MED ORDER — ONDANSETRON 4 MG PO TBDP: 4.0000 mg | ORAL_TABLET | Freq: Once | ORAL | Status: DC | PRN

## 2021-01-23 MED ORDER — DOXYCYCLINE HYCLATE 100 MG PO TABS
100.0000 mg | ORAL_TABLET | Freq: Once | ORAL | Status: AC
  Administered 2021-01-23: 10:00:00 100 mg via ORAL
  Filled 2021-01-23: qty 1

## 2021-01-23 MED ORDER — CIPROFLOXACIN HCL 500 MG PO TABS
500.0000 mg | ORAL_TABLET | Freq: Once | ORAL | Status: AC
  Administered 2021-01-23: 10:00:00 500 mg via ORAL
  Filled 2021-01-23: qty 1

## 2021-01-23 MED ORDER — BUPIVACAINE HCL (PF) 0.5 % IJ SOLN
5.0000 mL | Freq: Once | INTRAMUSCULAR | Status: AC
  Administered 2021-01-23: 09:00:00 5 mL
  Filled 2021-01-23: qty 10

## 2021-01-23 MED ORDER — CIPROFLOXACIN HCL 500 MG PO TABS: 500.0000 mg | ORAL_TABLET | Freq: Two times a day (BID) | ORAL | 0 refills | Status: AC

## 2021-01-23 MED ORDER — OXYCODONE HCL 5 MG PO TABS
5.0000 mg | ORAL_TABLET | Freq: Once | ORAL | Status: AC
  Administered 2021-01-23: 09:00:00 5 mg via ORAL
  Filled 2021-01-23: qty 1

## 2021-01-23 MED ORDER — DOXYCYCLINE HYCLATE 100 MG PO CAPS: 100.0000 mg | ORAL_CAPSULE | Freq: Two times a day (BID) | ORAL | 0 refills | Status: AC

## 2021-01-23 MED ORDER — ONDANSETRON 4 MG PO TBDP: 4.0000 mg | ORAL_TABLET | Freq: Three times a day (TID) | ORAL | 0 refills | Status: DC | PRN

## 2021-01-23 NOTE — Discharge Instructions (Addendum)
Please take Ibuprofen (Advil, motrin) and Tylenol (acetaminophen) to relieve your pain.    You may take up to 600 MG (3 pills) of normal strength ibuprofen every 8 hours as needed.   You make take tylenol, up to 1,000 mg (two extra strength pills) every 8 hours as needed.   It is safe to take ibuprofen and tylenol at the same time as they work differently.   Do not take more than 3,000 mg tylenol in a 24 hour period (not more than one dose every 8 hours.  Please check all medication labels as many medications such as pain and cold medications may contain tylenol.  Do not drink alcohol while taking these medications.  Do not take other NSAID'S while taking ibuprofen (such as aleve or naproxen).  Please take ibuprofen with food to decrease stomach upset.  You may have diarrhea from the antibiotics.  It is very important that you continue to take the antibiotics even if you get diarrhea unless a medical professional tells you that you may stop taking them.  If you stop too early the bacteria you are being treated for will become stronger and you may need different, more powerful antibiotics that have more side effects and worsening diarrhea.  Please stay well hydrated and consider probiotics as they may decrease the severity of your diarrhea.  Please be aware that if you take any hormonal contraception (birth control pills, nexplanon, the ring, etc) that your birth control will not work while you are taking antibiotics and you need to use back up protection as directed on the birth control medication information insert.   Your pregnancy test was negative.

## 2021-01-23 NOTE — ED Provider Notes (Signed)
[facility] EMERGENCY DEPARTMENT Provider Note   CSN: [id number] Arrival date & time: 01/22/21  2202     History Chief Complaint  Patient presents with   Otalgia    Jean Carlson is a 20 y.o. female with a past medical history of ADHD, MS, anxiety, who presents today for evaluation of pain in her left ear.  She states that over the past 24 hours she has had significantly worsening pain and swelling.  She states that the piercings are old, have been there for multiple months.  She states that she is unable to get the piercings on the left ear without as the front Roderic Scarce part has become embedded in the ear.  She denies any fevers.  She states it has been draining pus and purulent material.  She otherwise feels well.  She states that she feels like the right ear is getting slightly irritated.  The earrings are not new. She reports last tdap was earlier this year.   HPI     Past Medical History:  Diagnosis Date   ADHD    Multiple sclerosis [facility])     Patient Active Problem List   Diagnosis Date Noted   Gait disturbance 04/04/2020   Attention deficit hyperactivity disorder (ADHD) 02/05/2020   High risk medication use 05/03/2019   Vitamin D deficiency 05/03/2019   Depression with anxiety 05/03/2019   Class 1 obesity due to excess calories without serious comorbidity with body mass index (BMI) of 31.0 to 31.9 in adult    Neurogenic bowel    Hypokalemia    Neurogenic bladder 04/18/2019   Demyelinating disorder (HCC) 04/12/2019   ADHD 04/12/2019   Multiple sclerosis (HCC) 04/10/2019   Tobacco abuse 04/10/2019    Past Surgical History:  Procedure Laterality Date   WISDOM TOOTH EXTRACTION     x4     OB History   No obstetric history on file.     Family History  Problem Relation Age of Onset   Hyperlipidemia Maternal Grandmother    Hypertension Maternal Grandmother    Diabetes Paternal Grandfather    Hypertension Paternal Grandfather    Heart  failure Paternal Grandfather     Social History   Tobacco Use   Smoking status: Every Day   Smokeless tobacco: Never  Vaping Use   Vaping Use: Every day  Substance Use Topics   Alcohol use: Not Currently   Drug use: Never    Home Medications Prior to Admission medications   Medication Sig Start Date End Date Taking? Authorizing Provider  ciprofloxacin (CIPRO) 500 MG tablet Take 1 tablet (500 mg total) by mouth every 12 (twelve) hours for 7 days. 01/23/21 01/30/21 Yes Cristina Gong, PA-C  doxycycline (VIBRAMYCIN) 100 MG capsule Take 1 capsule (100 mg total) by mouth 2 (two) times daily for 10 days. 01/23/21 02/02/21 Yes Cristina Gong, PA-C  ondansetron (ZOFRAN-ODT) 4 MG disintegrating tablet Take 1 tablet (4 mg total) by mouth every 8 (eight) hours as needed for nausea or vomiting. 01/23/21  Yes Cristina Gong, PA-C  oxyCODONE-acetaminophen (PERCOCET/ROXICET) 5-325 MG tablet Take 1 tablet by mouth every 6 (six) hours as needed for severe pain. 01/23/21  Yes Cristina Gong, PA-C  acetaminophen (TYLENOL) 325 MG tablet Take 2 tablets (650 mg total) by mouth every 6 (six) hours as needed for mild pain (or Fever >/= 101). 04/24/19   Angiulli, Mcarthur Rossetti, PA-C  bisacodyl (DULCOLAX) 5 MG EC tablet Take 2 tablets (10 mg total)  by mouth daily after supper. 04/24/19   Angiulli, Mcarthur Rossetti, PA-C  cephALEXin (KEFLEX) 500 MG capsule Take 1 capsule (500 mg total) by mouth 3 (three) times daily. 12/03/20   Wallis Bamberg, PA-C  etonogestrel-ethinyl estradiol Lavella Lemons) 0.12-0.015 MG/24HR vaginal ring  01/24/20   [provider]  naproxen (NAPROSYN) 500 MG tablet Take 1 tablet (500 mg total) by mouth 2 (two) times daily with a meal. 12/03/20   Wallis Bamberg, PA-C  ocrelizumab (OCREVUS) 300 MG/10ML injection Inject into the vein once.    [provider]    Allergies    Amoxicillin, Codeine, and Vyvanse [lisdexamfetamine]  Review of Systems   Review of Systems   Constitutional:  Negative for chills and fever.  HENT:  Positive for ear discharge and ear pain. Negative for facial swelling, hearing loss, postnasal drip and trouble swallowing.   Neurological:  Negative for headaches.  All other systems reviewed and are negative.  Physical Exam Updated Vital Signs BP 97/78 (BP Location: Right Arm)    Pulse 82    Temp 98.6 F (37 C) (Axillary) Comment: pt drinking iced water at time of measurement   Resp 17    Ht 5\' 5"  (1.651 m)    Wt 92 kg    LMP 12/11/2020    SpO2 100%    BMI 33.75 kg/m   Physical Exam Vitals and nursing note reviewed.  Constitutional:      General: She is not in acute distress.    Appearance: She is not diaphoretic.  HENT:     Head: Normocephalic and atraumatic.     Ears:     Comments: The left ear has multiple piercings. The piercing extends from the earlobe up into the helix. One of the piercings on the left ear the stone that should be on the anterior aspect is not visible however is palpable under the skin with significant associated edema and erythema.  This piercing has crusted/dried discharge posteriorly.  In general the piercings have extensive buildup of what appears to be keratinized material.  Scant purulent drainage from the left ear infected piercing.  Left ear helix is very tender to touch. Eyes:     General: No scleral icterus.       Right eye: No discharge.        Left eye: No discharge.     Conjunctiva/sclera: Conjunctivae normal.  Cardiovascular:     Rate and Rhythm: Normal rate and regular rhythm.  Pulmonary:     Effort: Pulmonary effort is normal. No respiratory distress.     Breath sounds: No stridor.  Abdominal:     General: There is no distension.  Musculoskeletal:        General: No deformity.     Cervical back: Normal range of motion.  Skin:    General: Skin is warm and dry.  Neurological:     Mental Status: She is alert. Mental status is at baseline.     Motor: No abnormal muscle tone.   Psychiatric:     Comments: Patient is very anxious    ED Results / Procedures / Treatments   Labs (all labs ordered are listed, but only abnormal results are displayed) Labs Reviewed  POC URINE PREG, ED    EKG None  Radiology No results found.  Procedures .Foreign Body Removal  Date/Time: 01/23/2021 10:00 AM Performed by: 01/25/2021, PA-C Authorized by: Cristina Gong, PA-C  Consent: Verbal consent obtained. Risks and benefits: risks, benefits and alternatives were discussed  Consent given by: patient Patient understanding: patient states understanding of the procedure being performed Patient identity confirmed: verbally with patient Time out: Immediately prior to procedure a "time out" was called to verify the correct patient, procedure, equipment, support staff and site/side marked as required. Body area: skin General location: head/neck Location details: left external ear Anesthesia: local infiltration  Anesthesia: Local Anesthetic: bupivacaine 0.5% without epinephrine Anesthetic total: 1 mL Patient restrained: no Patient cooperative: yes Localization method: visualized Removal mechanism: scalpel and hemostat Complexity: simple 1 objects recovered. Objects recovered: earring with backing Post-procedure assessment: foreign body removed Comments: After local anesthetic was injected with a insulin syringe a small nick in the skin was made over the anterior aspect to allow delivery of the gym like stone on the earring anteriorly.  After this gauze and forcep/hemostats were able to be used to detach the backing from the earring and then the earring was able to be pulled through the ear anteriorly to provide minimal disruption.  There was scant purulent drainage.  Patient was very anxious during procedure, she tolerated it without difficulty.  She was multiple times given the option to discontinue the procedure however stated that she wished to proceed.     Medications Ordered in ED Medications  bupivacaine (MARCAINE) 0.5 % injection 5 mL (5 mLs Infiltration Given by Other 01/23/21 0850)  oxyCODONE (Oxy IR/ROXICODONE) immediate release tablet 5 mg (5 mg Oral Given 01/23/21 0850)  ciprofloxacin (CIPRO) tablet 500 mg (500 mg Oral Given 01/23/21 0946)  doxycycline (VIBRA-TABS) tablet 100 mg (100 mg Oral Given 01/23/21 0946)    ED Course  I have reviewed the triage vital signs and the nursing notes.  Pertinent labs & imaging results that were available during my care of the patient were reviewed by me and considered in my medical decision making (see chart for details).  Clinical Course as of 01/23/21 1623  Thu Jan 23, 2021  1031 If problem cal lthe office.  [EH]    Clinical Course User Index [EH] Norman Clay   MDM Rules/Calculators/A&P                          Patient is a 20 year old woman who presents today for evaluation of a infected earring embedded in the left ear. Verbal consent was obtained.  Risks, benefits, and alternatives were discussed.  Area was anesthetized with bupivacaine 0.5% without epinephrine.  Superficial skin nick was made to allow the gym like stone to pass anteriorly, after which I was able to remove the backing on the earring and remove the earring stud anteriorly.  Patient tolerated this with difficulty and was very anxious throughout entire encounter. She is afebrile, not tachycardic or tachypneic.  Pregnancy test was obtained and is negative. I recommended that she clean around her earrings better and avoid wearing earrings in this ear until this is fixed.   She states her last tetanus shot was earlier this year.  I spoke with Dr.Teoh on-call for ENT.  He is in agreement with treating with Cipro.  We will also treat with doxycycline. He states patient only needs to see him in the office if she doesn't improve in the next few days.   [facility] PMP is consulted, she is given a  prescription for short course of Percocet as needed for pain with instructions on additional ibuprofen and safe additional dosing of Tylenol. She is also given a prescription for Zofran for nausea as needed.  Return precautions were discussed with patient who states their understanding.  At the time of discharge patient denied any unaddressed complaints or concerns.  Patient is agreeable for discharge home.  Note: Portions of this report may have been transcribed using voice recognition software. Every effort was made to ensure accuracy; however, inadvertent computerized transcription errors may be present   Final Clinical Impression(s) / ED Diagnoses Final diagnoses:  Embedded earring of left ear, initial encounter  Pierced ear infection, left, initial encounter    Rx / DC Orders ED Discharge Orders          Ordered    ciprofloxacin (CIPRO) 500 MG tablet  Every 12 hours        01/23/21 1043    doxycycline (VIBRAMYCIN) 100 MG capsule  2 times daily        01/23/21 1043    oxyCODONE-acetaminophen (PERCOCET/ROXICET) 5-325 MG tablet  Every 6 hours PRN        01/23/21 1043    ondansetron (ZOFRAN-ODT) 4 MG disintegrating tablet  Every 8 hours PRN        01/23/21 1043             Cristina Gong, PA-C 01/23/21 1625    Franne Forts, DO 01/25/21 2309
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
## 2021-02-10 ENCOUNTER — Ambulatory Visit (INDEPENDENT_AMBULATORY_CARE_PROVIDER_SITE_OTHER): Payer: BLUE CROSS/BLUE SHIELD | Admitting: Family Medicine

## 2021-02-10 ENCOUNTER — Encounter: Payer: Self-pay | Admitting: Family Medicine

## 2021-02-10 VITALS — BP 112/72 | Ht 65.0 in | Wt 195.5 lb

## 2021-02-10 DIAGNOSIS — Z79899 Other long term (current) drug therapy: Secondary | ICD-10-CM | POA: Diagnosis not present

## 2021-02-10 DIAGNOSIS — R2689 Other abnormalities of gait and mobility: Secondary | ICD-10-CM

## 2021-02-10 DIAGNOSIS — E559 Vitamin D deficiency, unspecified: Secondary | ICD-10-CM

## 2021-02-10 DIAGNOSIS — G35 Multiple sclerosis: Secondary | ICD-10-CM

## 2021-02-10 NOTE — Patient Instructions (Signed)
Below is our plan:  We will continue Ocrevus infusions every 6 months   Please make sure you are staying well hydrated. I recommend 50-60 ounces daily. Well balanced diet and regular exercise encouraged. Consistent sleep schedule with 6-8 hours recommended.   Please continue follow up with care team as directed.   Follow up with Dr Epimenio Foot in 6 months   You may receive a survey regarding today's visit. I encourage you to leave honest feed back as I do use this information to improve patient care. Thank you for seeing me today!

## 2021-02-11 LAB — CBC WITH DIFFERENTIAL/PLATELET
Basophils Absolute: 0 10*3/uL (ref 0.0–0.2)
Basos: 1 %
EOS (ABSOLUTE): 0.2 10*3/uL (ref 0.0–0.4)
Eos: 5 %
Hematocrit: 47.2 % — ABNORMAL HIGH (ref 34.0–46.6)
Hemoglobin: 15.9 g/dL (ref 11.1–15.9)
Immature Grans (Abs): 0 10*3/uL (ref 0.0–0.1)
Immature Granulocytes: 0 %
Lymphocytes Absolute: 1.3 10*3/uL (ref 0.7–3.1)
Lymphs: 41 %
MCH: 28.2 pg (ref 26.6–33.0)
MCHC: 33.7 g/dL (ref 31.5–35.7)
MCV: 84 fL (ref 79–97)
Monocytes Absolute: 0.4 10*3/uL (ref 0.1–0.9)
Monocytes: 12 %
Neutrophils Absolute: 1.3 10*3/uL — ABNORMAL LOW (ref 1.4–7.0)
Neutrophils: 41 %
Platelets: 264 10*3/uL (ref 150–450)
RBC: 5.64 x10E6/uL — ABNORMAL HIGH (ref 3.77–5.28)
RDW: 12.2 % (ref 11.7–15.4)
WBC: 3.3 10*3/uL — ABNORMAL LOW (ref 3.4–10.8)

## 2021-02-11 LAB — VITAMIN D 25 HYDROXY (VIT D DEFICIENCY, FRACTURES): Vit D, 25-Hydroxy: 42.4 ng/mL (ref 30.0–100.0)

## 2021-02-11 LAB — IGG, IGA, IGM
IgA/Immunoglobulin A, Serum: 260 mg/dL (ref 87–352)
IgG (Immunoglobin G), Serum: 947 mg/dL (ref 586–1602)
IgM (Immunoglobulin M), Srm: 17 mg/dL — ABNORMAL LOW (ref 26–217)

## 2021-02-17 DIAGNOSIS — A599 Trichomoniasis, unspecified: Secondary | ICD-10-CM | POA: Diagnosis not present

## 2021-02-17 DIAGNOSIS — N898 Other specified noninflammatory disorders of vagina: Secondary | ICD-10-CM | POA: Diagnosis not present

## 2021-02-17 DIAGNOSIS — Z3202 Encounter for pregnancy test, result negative: Secondary | ICD-10-CM | POA: Diagnosis not present

## 2021-02-17 DIAGNOSIS — N926 Irregular menstruation, unspecified: Secondary | ICD-10-CM | POA: Diagnosis not present

## 2021-03-05 ENCOUNTER — Telehealth: Payer: Self-pay

## 2021-03-05 NOTE — Telephone Encounter (Signed)
Received [employer] paperwork. Paperwork was completed and patient was called and advised it was ready for pick up, paperwork placed at check in

## 2021-03-06 DIAGNOSIS — Z0289 Encounter for other administrative examinations: Secondary | ICD-10-CM

## 2021-03-13 ENCOUNTER — Encounter: Payer: Self-pay | Admitting: Neurology

## 2021-03-13 DIAGNOSIS — G35 Multiple sclerosis: Secondary | ICD-10-CM | POA: Diagnosis not present

## 2021-03-14 DIAGNOSIS — R197 Diarrhea, unspecified: Secondary | ICD-10-CM | POA: Diagnosis not present

## 2021-03-14 DIAGNOSIS — R110 Nausea: Secondary | ICD-10-CM | POA: Diagnosis not present

## 2021-03-14 DIAGNOSIS — R1084 Generalized abdominal pain: Secondary | ICD-10-CM | POA: Diagnosis not present

## 2021-03-17 ENCOUNTER — Encounter: Payer: Self-pay | Admitting: *Deleted

## 2021-04-08 DIAGNOSIS — Z01419 Encounter for gynecological examination (general) (routine) without abnormal findings: Secondary | ICD-10-CM | POA: Diagnosis not present

## 2021-04-08 DIAGNOSIS — Z113 Encounter for screening for infections with a predominantly sexual mode of transmission: Secondary | ICD-10-CM | POA: Diagnosis not present

## 2021-05-05 DIAGNOSIS — M5137 Other intervertebral disc degeneration, lumbosacral region: Secondary | ICD-10-CM | POA: Diagnosis not present

## 2021-05-05 DIAGNOSIS — M9903 Segmental and somatic dysfunction of lumbar region: Secondary | ICD-10-CM | POA: Diagnosis not present

## 2021-05-05 DIAGNOSIS — M545 Low back pain, unspecified: Secondary | ICD-10-CM | POA: Diagnosis not present

## 2021-05-08 DIAGNOSIS — M545 Low back pain, unspecified: Secondary | ICD-10-CM | POA: Diagnosis not present

## 2021-05-08 DIAGNOSIS — M5137 Other intervertebral disc degeneration, lumbosacral region: Secondary | ICD-10-CM | POA: Diagnosis not present

## 2021-05-08 DIAGNOSIS — M9903 Segmental and somatic dysfunction of lumbar region: Secondary | ICD-10-CM | POA: Diagnosis not present

## 2021-07-17 ENCOUNTER — Telehealth: Payer: Self-pay | Admitting: Neurology

## 2021-07-17 NOTE — Telephone Encounter (Signed)
Rescheduled 7/20 appt with pt over the phone- MD out.

## 2021-08-04 ENCOUNTER — Encounter: Payer: Self-pay | Admitting: *Deleted

## 2021-08-11 DIAGNOSIS — N911 Secondary amenorrhea: Secondary | ICD-10-CM | POA: Diagnosis not present

## 2021-08-14 ENCOUNTER — Ambulatory Visit: Payer: BLUE CROSS/BLUE SHIELD | Admitting: Neurology

## 2021-08-19 DIAGNOSIS — N912 Amenorrhea, unspecified: Secondary | ICD-10-CM | POA: Diagnosis not present

## 2021-08-26 ENCOUNTER — Ambulatory Visit (INDEPENDENT_AMBULATORY_CARE_PROVIDER_SITE_OTHER): Payer: BLUE CROSS/BLUE SHIELD | Admitting: Neurology

## 2021-08-26 ENCOUNTER — Encounter: Payer: Self-pay | Admitting: Neurology

## 2021-08-26 VITALS — BP 137/79 | HR 85 | Ht 65.0 in | Wt 218.0 lb

## 2021-08-26 DIAGNOSIS — H532 Diplopia: Secondary | ICD-10-CM

## 2021-08-26 DIAGNOSIS — R269 Unspecified abnormalities of gait and mobility: Secondary | ICD-10-CM

## 2021-08-26 DIAGNOSIS — R3915 Urgency of urination: Secondary | ICD-10-CM | POA: Diagnosis not present

## 2021-08-26 DIAGNOSIS — Z79899 Other long term (current) drug therapy: Secondary | ICD-10-CM | POA: Diagnosis not present

## 2021-08-26 DIAGNOSIS — G35 Multiple sclerosis: Secondary | ICD-10-CM | POA: Diagnosis not present

## 2021-08-26 DIAGNOSIS — G35D Multiple sclerosis, unspecified: Secondary | ICD-10-CM

## 2021-08-26 MED ORDER — SOLIFENACIN SUCCINATE 5 MG PO TABS: 5.0000 mg | ORAL_TABLET | Freq: Every day | ORAL | 5 refills | Status: DC

## 2021-08-26 NOTE — Progress Notes (Addendum)
[facility]  PATIENT: Jean Carlson DOB: [birth year]  REFERRING DOCTOR OR PCP:  Milus Height, PA SOURCE: Patient, notes from [facility], imaging and lab reports, MRI images personally reviewed.  _________________________________   HISTORICAL  CHIEF COMPLAINT:  Chief Complaint  Patient presents with   Follow-up    Rm 1, w mother. Here for 6 month MS f/u, on Ocrevus and tolerating well. Last infusion date: 03/13/2021 and Next infusion date: 09/11/2021. Pt states that 6 weeks before her next infusion she starts having flare ups. Such as double vision, tremors, off balance, ans speech. Pt would like to have something to help her during that 6 week gap to get her, to her next infusion.     HISTORY OF PRESENT ILLNESS:  Jean Carlson is a 21 y.o.  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  Update 08/26/2021 She is on Ocrevus and tolerates it well.  Next infusion is 09/11/2021  She denies exacerbations since last visit and is fairly stable.     Since 07/11/2021, she has noted more issues wtih balance, tremors and diplopia.   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  Symptoms fluctuate a lot.   Symptoms vary more with fatigue and stress rather than heat.[name]   Poor sleep can trigger having more symptoms that day.  She had similar symptoms for about a month back in January before her last Ocrevus infusion.  Most nights she sleeps well - sleep onset > sleep maintenance.   She has a lot of thoughts in her mind at those times.    Gait and balance are doing better though not baseline.   She will ise the banister if she needs to hold items with the other hand.    No falls.   Her left arm is slightly weaker, especially in the wrist.   She has some reduced keyboarding due to a mild left tremor.    Fortunately, she is right handed and writing is not affected.       She has urinary urgency .  [name]      She has mild diplopia.   Vision is doing well.  Focus and attention are doing better and she  is not using Concerta.   She has some emotional lability and sometimes feels rage.     Prozac had not helped and she stopped.  She has rare crying spells .  No inappropriate laughter.  She usually needs 9-10 hours of sleep to feel refreshed.   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    The Jean Carlson has [facility] but the warehouse does not and summers are tough.     She takes 5000 U daily Vit D.  Level was good last visit..   We discussed healthier diet like the Mediterranean.    Stay has an active job and walks 7500 steps or so many day  MS History: In March 2021, she had the onset of slurred speech,left hand tremor and clumsiness, abnormal eye movements on the right and urinary incontinence.   She went to the ED.    She was admitted and received 5 days of IV Solu-Medrol.   While in the hospital she started to note improvement and she has continued to improve.    In retrospect, in February 2021, she had vertigo x 3 weeks and had several falls, one hyperextending her left knee.   She saw orthopedics.   Also in late 2020, she  had a couple weeks of fluctuating numbness in her left hand.  MRI showed enhancing lesions in the pons, medulla and spinal cord (and several other nonenhancing brainstem and spinal cord.   She was started on Ocrevus 05/2019.     MRIs of the brain and cervical spine dated 04/09/2019.  The MRI of the brain shows multiple T2/FLAIR hyperintense foci in the hemispheres, brainstem and cerebellum.  2 foci in the pons and medulla enhance after contrast.  There are also several foci in the hemispheres that enhanced.  There are several foci within the spinal cord.  These are located at C2, more to the right, C3, towards the right and centrally and C4-C5 centrally.  There is subtle enhancement of the C4C5 focus.    MRI of the brain 07/11/2020 showed multiple T2/FLAIR hyperintense foci in the medulla and pons.  Additional foci were noted in the periventricular and  deep white matter.  None of the foci enhance.  Compared to the MRI from 04/09/2019, there were no new lesions and the many enhancing lesions on the previous scan no longer enhance and many are smaller in size.  There is no family history of MS or other autoimmune diseases.   REVIEW OF SYSTEMS: Constitutional: No fevers, chills, sweats, or change in appetite. Eyes: No visual changes, double vision, eye pain Ear, nose and throat: No hearing loss, ear pain, nasal congestion, sore throat Cardiovascular: No chest pain, palpitations Respiratory:  No shortness of breath at rest or with exertion.   No wheezes GastrointestinaI: No nausea, vomiting, diarrhea, abdominal pain, fecal incontinence Genitourinary:  No dysuria, urinary retention or frequency.  No incontinence or nocturia.   Musculoskeletal:  No neck pain, back pain Integumentary: No rash, pruritus, skin lesions Neurological: as above Psychiatric: As above Endocrine: No palpitations, diaphoresis, change in appetite, change in weigh or increased thirst Hematologic/Lymphatic:  No anemia, purpura, petechiae. Allergic/Immunologic: No itchy/runny eyes, nasal congestion, recent allergic reactions, rashes  ALLERGIES: Allergies  Allergen Reactions   Amoxicillin Other (See Comments)    CAUSED BAD NIGHTMARES   Codeine Nausea Only    SEVERE NAUSEA   Vyvanse [Lisdexamfetamine] Other (See Comments)    "Caused depression to the point of self-harm"    HOME MEDICATIONS:  Current Outpatient Medications:    acetaminophen (TYLENOL) 325 MG tablet, Take 2 tablets (650 mg total) by mouth every 6 (six) hours as needed for mild pain (or Fever >/= 101)., Disp:  , Rfl:    bisacodyl (DULCOLAX) 5 MG EC tablet, Take 2 tablets (10 mg total) by mouth daily after supper., Disp: 30 tablet, Rfl: 0   cholecalciferol (VITAMIN D3) 25 MCG (1000 UNIT) tablet, Take 5,000 Units by mouth daily., Disp: , Rfl:    etonogestrel-ethinyl estradiol (NUVARING) 0.12-0.015 MG/24HR  vaginal ring, , Disp: , Rfl:    ocrelizumab (OCREVUS) 300 MG/10ML injection, Inject into the vein once., Disp: , Rfl:    solifenacin (VESICARE) 5 MG tablet, Take 1 tablet (5 mg total) by mouth daily., Disp: 30 tablet, Rfl: 5  PAST MEDICAL HISTORY: Past Medical History:  Diagnosis Date   ADHD    Multiple sclerosis (HCC)     PAST SURGICAL HISTORY: Past Surgical History:  Procedure Laterality Date   WISDOM TOOTH EXTRACTION     x4    FAMILY HISTORY: Family History  Problem Relation Age of Onset   Hyperlipidemia Maternal Grandmother    Hypertension Maternal Grandmother    Diabetes Paternal Grandfather    Hypertension Paternal Actor  Heart failure Paternal Grandfather     SOCIAL HISTORY:  Social History   Socioeconomic History   Marital status: Single    Spouse name: Not on file   Number of children: Not on file   Years of education: Not on file   Highest education level: Not on file  Occupational History    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  Tobacco Use   Smoking status: Every Day   Smokeless tobacco: Never  Vaping Use   Vaping Use: Every day  Substance and Sexual Activity   Alcohol use: Not Currently   Drug use: Never   Sexual activity: Not on file  Other Topics Concern   Not on file  Social History Narrative   Right handed   Caffeine use: tea and soda daily   Social Determinants of Health   Financial Resource Strain: Not on file  Food Insecurity: Not on file  Transportation Needs: Not on file  Physical Activity: Not on file  Stress: Not on file  Social Connections: Not on file  Intimate Partner Violence: Not on file     PHYSICAL EXAM  Vitals:   08/26/21 0814  BP: 137/79  Pulse: 85  Weight: 218 lb (98.9 kg)  Height: 5\' 5"  (1.651 m)    Body mass index is 36.28 kg/m.  No results found.  General: The patient is well-developed and well-nourished and in no acute distress  HEENT:  Head is [place]/AT.    Skin: Extremities are without rash  or  edema.   Neurologic Exam  Mental status: The patient is alert and oriented x 3 at the time of the examination. The patient has apparent normal recent and remote memory, with an apparently normal attention span and concentration ability.   Speech is normal.  Cranial nerves: Extraocular movements are full though she noted slight diplopia on far right gaze.[name]  No nystagmus.  There is good facial sensation to soft touch bilaterally.Facial strength is normal.  Trapezius and sternocleidomastoid strength is normal. No dysarthria is noted.  No obvious hearing deficits are noted.  Motor:  Muscle bulk is normal.  Very slight tremor on the left tone is normal. Strength is  5 / 5 in all 4 extremities.   Sensory: Sensory testing is intact to soft touch and vibration sensation in arms and mild reduced vibration in left leg.  Coordination: Finger-nose-finger is slightly reduced in the left arm versus the right arm.  Nutrition was normal  Gait and station: Station is normal.   Gait is normal.  Tandem gait is mildly wide.  Romberg is negative.   Reflexes: Deep tendon reflexes show spread at the knees and increased at the ankles with nonsustained clonus.        DIAGNOSTIC DATA (LABS, IMAGING, TESTING) - I reviewed patient records, labs, notes, testing and imaging myself where available.  Lab Results  Component Value Date   WBC 3.3 (L) 02/10/2021   HGB 15.9 02/10/2021   HCT 47.2 (H) 02/10/2021   MCV 84 02/10/2021   PLT 264 02/10/2021      Component Value Date/Time   NA 139 04/24/2019 0607   K 3.8 04/24/2019 0607   CL 104 04/24/2019 0607   CO2 24 04/24/2019 0607   GLUCOSE 81 04/24/2019 0607   BUN 7 04/24/2019 0607   CREATININE 0.74 04/24/2019 0607   CALCIUM 9.0 04/24/2019 0607   PROT 6.0 (L) 04/18/2019 0511   ALBUMIN 3.5 04/18/2019 0511   AST 30 04/18/2019 0511   ALT 43 04/18/2019  0511   ALKPHOS 51 04/18/2019 0511   BILITOT 1.1 04/18/2019 0511   GFRNONAA >60 04/24/2019 0607   GFRAA  >60 04/24/2019 0607    Lab Results  Component Value Date   TSH 2.107 04/09/2019       ASSESSMENT AND PLAN  Multiple sclerosis (HCC) - Plan: CD20 B Cells, CBC with Differential/Platelet, IgG, IgA, IgM, MR BRAIN W WO CONTRAST, MR CERVICAL SPINE W WO CONTRAST  High risk medication use - Plan: CD20 B Cells, CBC with Differential/Platelet, IgG, IgA, IgM  Gait disturbance  Urinary urgency - Plan: MR CERVICAL SPINE W WO CONTRAST  Diplopia - Plan: MR BRAIN W WO CONTRAST   1.   She is experiencing more symptoms the last 6 weeks of each Ocrevus cycle.  I do not think this is an exacerbation.  However, she might have some improvement with IV Solu-Medrol and I will have her do 1 g today.  Consider doing this every cycle if this is helpful.  For now we will continue Ocrevus but we also discussed Kesimpta as another option.  Ocrevus.     We will check some blood work today (IgG/IgM/IgA and CBC/D and CD20).   If the CD20 count is above 2% we need to consider a shorter interval for the Ocrevus.  Check MRI of the brain and cervical spine to determine if there is any subclinical activity.  If present, consider a different disease modifying therapy. 2.    Continue taking vitamin D.  Try to eat well and exercise as tolerated. 3.    Vesicare for bladder issues.   4.     We will see her back in 6 months or sooner if there are new or worsening neurologic symptoms.    Batina Dougan A. Epimenio Foot, MD, [facility] 08/26/2021, 9:54 AM Certified in Neurology, Clinical Neurophysiology, Sleep Medicine and Neuroimaging  [facility] [address] [place], [place] [address] [phone]

## 2021-08-27 LAB — CBC WITH DIFFERENTIAL/PLATELET
Basophils Absolute: 0 10*3/uL (ref 0.0–0.2)
Basos: 1 %
EOS (ABSOLUTE): 0.2 10*3/uL (ref 0.0–0.4)
Eos: 2 %
Hematocrit: 44.9 % (ref 34.0–46.6)
Hemoglobin: 15.3 g/dL (ref 11.1–15.9)
Immature Grans (Abs): 0 10*3/uL (ref 0.0–0.1)
Immature Granulocytes: 0 %
Lymphocytes Absolute: 1.5 10*3/uL (ref 0.7–3.1)
Lymphs: 23 %
MCH: 29.1 pg (ref 26.6–33.0)
MCHC: 34.1 g/dL (ref 31.5–35.7)
MCV: 85 fL (ref 79–97)
Monocytes Absolute: 0.6 10*3/uL (ref 0.1–0.9)
Monocytes: 9 %
Neutrophils Absolute: 4.2 10*3/uL (ref 1.4–7.0)
Neutrophils: 65 %
Platelets: 250 10*3/uL (ref 150–450)
RBC: 5.26 x10E6/uL (ref 3.77–5.28)
RDW: 11.9 % (ref 11.7–15.4)
WBC: 6.4 10*3/uL (ref 3.4–10.8)

## 2021-08-27 LAB — IGG, IGA, IGM
IgG (Immunoglobin G), Serum: 837 mg/dL (ref 586–1602)
IgM (Immunoglobulin M), Srm: 16 mg/dL — ABNORMAL LOW (ref 26–217)
Immunoglobulin A, (IgA) QN, Serum: 206 mg/dL (ref 87–352)

## 2021-08-27 LAB — CD20 B CELLS
% CD19-B Cells: 0 % — ABNORMAL LOW (ref 4.6–22.1)
% CD20-B Cells: 0 % — ABNORMAL LOW (ref 5.0–22.3)

## 2021-08-28 ENCOUNTER — Telehealth: Payer: Self-pay | Admitting: Neurology

## 2021-08-28 NOTE — Telephone Encounter (Signed)
I have [employer] auth I called the patient to schedule and her vm is full. Sending her mychart message to call me to schedule.

## 2021-08-29 ENCOUNTER — Telehealth: Payer: Self-pay | Admitting: Neurology

## 2021-08-29 NOTE — Telephone Encounter (Signed)
[facility]) notifying [facility] MRI upper spinal canal before and after contrast has been approved. MRI   Also MRI scan of the brain before and after contrast scheduled at [facility] has been approved. Authorization no: [id number]

## 2021-09-03 ENCOUNTER — Encounter: Payer: Self-pay | Admitting: Neurology

## 2021-09-03 DIAGNOSIS — R3915 Urgency of urination: Secondary | ICD-10-CM

## 2021-09-03 MED ORDER — SOLIFENACIN SUCCINATE 5 MG PO TABS: 5.0000 mg | ORAL_TABLET | Freq: Two times a day (BID) | ORAL | 5 refills | Status: DC

## 2021-09-11 DIAGNOSIS — G35 Multiple sclerosis: Secondary | ICD-10-CM | POA: Diagnosis not present

## 2021-09-17 ENCOUNTER — Ambulatory Visit (INDEPENDENT_AMBULATORY_CARE_PROVIDER_SITE_OTHER): Payer: BLUE CROSS/BLUE SHIELD

## 2021-09-17 DIAGNOSIS — H532 Diplopia: Secondary | ICD-10-CM

## 2021-09-17 DIAGNOSIS — G35 Multiple sclerosis: Secondary | ICD-10-CM | POA: Diagnosis not present

## 2021-09-17 DIAGNOSIS — R3915 Urgency of urination: Secondary | ICD-10-CM | POA: Diagnosis not present

## 2021-09-17 MED ORDER — GADOBENATE DIMEGLUMINE 529 MG/ML IV SOLN
20.0000 mL | Freq: Once | INTRAVENOUS | Status: AC | PRN
  Administered 2021-09-17: 20 mL via INTRAVENOUS

## 2022-02-16 ENCOUNTER — Telehealth: Payer: Self-pay | Admitting: Neurology

## 2022-02-16 NOTE — Telephone Encounter (Signed)
Checked with Angie/billing- pt has no balance.  I called pt to further discuss at [phone]. LVM for pt to call. If she calls, please offer appt with Dr. Felecia Shelling this Wednesday at 3pm

## 2022-02-16 NOTE — Telephone Encounter (Signed)
Pt called, she is unable to accept the appointment for Wed @ 3.  Pt asking to be called with another slot Terrence Dupont, RN can arrange.

## 2022-02-16 NOTE — Telephone Encounter (Signed)
Pt is calling. Stated she needs a referral for a round of steroids at the infusion suite.

## 2022-02-16 NOTE — Telephone Encounter (Signed)
Called pt back. Balance worse, having worsening tremors. Scheduled sooner appt for 02/23/22 at 9am with Dr. Felecia Shelling. Pt felt this was not urgent, ok to wait until appt to be seen/further evaluated. Aware to call back if sx worsen or she develops any new sx

## 2022-02-23 ENCOUNTER — Ambulatory Visit (INDEPENDENT_AMBULATORY_CARE_PROVIDER_SITE_OTHER): Payer: Self-pay | Admitting: Neurology

## 2022-02-23 ENCOUNTER — Encounter: Payer: Self-pay | Admitting: Neurology

## 2022-02-23 ENCOUNTER — Telehealth: Payer: Self-pay | Admitting: Neurology

## 2022-02-23 VITALS — BP 120/82 | HR 86 | Ht 65.0 in | Wt 227.5 lb

## 2022-02-23 DIAGNOSIS — R3915 Urgency of urination: Secondary | ICD-10-CM

## 2022-02-23 DIAGNOSIS — H532 Diplopia: Secondary | ICD-10-CM

## 2022-02-23 DIAGNOSIS — R269 Unspecified abnormalities of gait and mobility: Secondary | ICD-10-CM

## 2022-02-23 DIAGNOSIS — R2689 Other abnormalities of gait and mobility: Secondary | ICD-10-CM

## 2022-02-23 DIAGNOSIS — Z79899 Other long term (current) drug therapy: Secondary | ICD-10-CM

## 2022-02-23 DIAGNOSIS — G35 Multiple sclerosis: Secondary | ICD-10-CM

## 2022-02-23 MED ORDER — SOLIFENACIN SUCCINATE 10 MG PO TABS: 5.0000 mg | ORAL_TABLET | Freq: Two times a day (BID) | ORAL | 3 refills | Status: DC

## 2022-02-23 NOTE — Progress Notes (Signed)
[facility]  PATIENT: Jean Carlson DOB: [birth year]  REFERRING DOCTOR OR PCP:  Milus Height, PA SOURCE: Patient, notes from [facility], imaging and lab reports, MRI images personally reviewed.  _________________________________   HISTORICAL  CHIEF COMPLAINT:  Chief Complaint  Patient presents with   Follow-up    Patient is in room 11 with husband, here for follow up MS. Reports tremors and balance worsening for about 2 weeks now. Reports vision is doing well.     HISTORY OF PRESENT ILLNESS:  Jean Carlson is a 22 y.o.  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  Update 02/23/2022 She is on Ocrevus and tolerates it well.  Next infusion is 03/12/2022  She denies exacerbations since last visit and is fairly stable.     She denies any major new neurologic symptoms.    However, the last month of each cycle, , she has noted more issues wtih balance, tremors and diplopia.  She also has more fatigue.   Last cycle an extra dose of IV Solumedrol a couple weeks before her infusion helped her with the symptoms.  We discussed that some patients reprot a "crap gap" but the effect of the medicne on her B cells persists the whole 6 months (last B cell count at 5+ months was 0).     She has intermittent diplopia.   Symptoms vary more with fatigue and stress rather than heat..   She sleeps well most nights.   Poor sleep can trigger having more symptoms that day.  She had similar symptoms for about a month before her last 2 Ocrevus infusion.  Most nights she sleeps well - sleep onset > sleep maintenance.   She has a lot of thoughts in her mind at those times.    Gait and balance are doing better though not baseline.   She will ise the banister if she needs to hold items with the other hand.    No falls.   Her left arm is slightly weaker, especially in the wrist.   She has some reduced keyboarding due to a mild left tremor.    Fortunately, she is right handed and writing is not affected.       She  has urinary urgency .  [name]      She has mild diplopia.   Vision is doing well.  Focus and attention are doing better and she is not using Concerta.   She has some emotional lability and sometimes feels rage.     Prozac had not helped and she stopped.  She has rare crying spells .  No inappropriate laughter.  She usually needs 9-10 hours of sleep to feel refreshed.   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    The Zenaida Niece has [facility] but the warehouse does not and summers are tough.     She takes 5000 U daily Vit D.  Level was good last visit..   We discussed healthier diet like the Mediterranean.    Stay has an active job and walks 7500 steps or so many day  MS History: In March 2021, she had the onset of slurred speech,left hand tremor and clumsiness, abnormal eye movements on the right and urinary incontinence.   She went to the ED.    She was admitted and received 5 days of IV Solu-Medrol.   While in the hospital she started to note improvement and she has continued to improve.  In retrospect, in February 2021, she had vertigo x 3 weeks and had several falls, one hyperextending her left knee.   She saw orthopedics.   Also in late 2020, she had a couple weeks of fluctuating numbness in her left hand.  MRI showed enhancing lesions in the pons, medulla and spinal cord (and several other nonenhancing brainstem and spinal cord.   She was started on Ocrevus 05/2019.     MRIs of the brain and cervical spine dated 04/09/2019.  The MRI of the brain shows multiple T2/FLAIR hyperintense foci in the hemispheres, brainstem and cerebellum.  2 foci in the pons and medulla enhance after contrast.  There are also several foci in the hemispheres that enhanced.  There are several foci within the spinal cord.  These are located at C2, more to the right, C3, towards the right and centrally and C4-C5 centrally.  There is subtle enhancement of the C4C5 focus.    MRI of the brain  07/11/2020 showed multiple T2/FLAIR hyperintense foci in the medulla and pons.  Additional foci were noted in the periventricular and deep white matter.  None of the foci enhance.  Compared to the MRI from 04/09/2019, there were no new lesions and the many enhancing lesions on the previous scan no longer enhance and many are smaller in size.  MRI brian and cervical spine 09/17/2021 showed no new lesions.  She has muliple spinal lesions  There is no family history of MS or other autoimmune diseases.   REVIEW OF SYSTEMS: Constitutional: No fevers, chills, sweats, or change in appetite. Eyes: No visual changes, double vision, eye pain Ear, nose and throat: No hearing loss, ear pain, nasal congestion, sore throat Cardiovascular: No chest pain, palpitations Respiratory:  No shortness of breath at rest or with exertion.   No wheezes GastrointestinaI: No nausea, vomiting, diarrhea, abdominal pain, fecal incontinence Genitourinary:  No dysuria, urinary retention or frequency.  No incontinence or nocturia.   Musculoskeletal:  No neck pain, back pain Integumentary: No rash, pruritus, skin lesions Neurological: as above Psychiatric: As above Endocrine: No palpitations, diaphoresis, change in appetite, change in weigh or increased thirst Hematologic/Lymphatic:  No anemia, purpura, petechiae. Allergic/Immunologic: No itchy/runny eyes, nasal congestion, recent allergic reactions, rashes  ALLERGIES: Allergies  Allergen Reactions   Amoxicillin Other (See Comments)    CAUSED BAD NIGHTMARES   Codeine Nausea Only    SEVERE NAUSEA   Vyvanse [Lisdexamfetamine] Other (See Comments)    "Caused depression to the point of self-harm"    HOME MEDICATIONS:  Current Outpatient Medications:    acetaminophen (TYLENOL) 325 MG tablet, Take 2 tablets (650 mg total) by mouth every 6 (six) hours as needed for mild pain (or Fever >/= 101)., Disp:  , Rfl:    cholecalciferol (VITAMIN D3) 25 MCG (1000 UNIT) tablet, Take  5,000 Units by mouth daily., Disp: , Rfl:    etonogestrel-ethinyl estradiol (NUVARING) 0.12-0.015 MG/24HR vaginal ring, , Disp: , Rfl:    ocrelizumab (OCREVUS) 300 MG/10ML injection, Inject into the vein once., Disp: , Rfl:    bisacodyl (DULCOLAX) 5 MG EC tablet, Take 2 tablets (10 mg total) by mouth daily after supper. (Patient not taking: Reported on 02/23/2022), Disp: 30 tablet, Rfl: 0   solifenacin (VESICARE) 10 MG tablet, Take 0.5 tablets (5 mg total) by mouth in the morning and at bedtime., Disp: 90 tablet, Rfl: 3  PAST MEDICAL HISTORY: Past Medical History:  Diagnosis Date   ADHD    Multiple sclerosis ([place])  PAST SURGICAL HISTORY: Past Surgical History:  Procedure Laterality Date   WISDOM TOOTH EXTRACTION     x4    FAMILY HISTORY: Family History  Problem Relation Age of Onset   Hyperlipidemia Maternal Grandmother    Hypertension Maternal Grandmother    Diabetes Paternal Grandfather    Hypertension Paternal Grandfather    Heart failure Paternal Grandfather     SOCIAL HISTORY:  Social History   Socioeconomic History   Marital status: Single    Spouse name: Not on file   Number of children: Not on file   Years of education: Not on file   Highest education level: Not on file  Occupational History    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  Tobacco Use   Smoking status: Every Day   Smokeless tobacco: Never  Vaping Use   Vaping Use: Every day  Substance and Sexual Activity   Alcohol use: Not Currently   Drug use: Never   Sexual activity: Not on file  Other Topics Concern   Not on file  Social History Narrative   Right handed   Caffeine use: tea and soda daily   Social Determinants of Health   Financial Resource Strain: Not on file  Food Insecurity: Not on file  Transportation Needs: Not on file  Physical Activity: Not on file  Stress: Not on file  Social Connections: Not on file  Intimate Partner Violence: Not on file     PHYSICAL EXAM  Vitals:    02/23/22 0834  BP: 120/82  Pulse: 86  Weight: 227 lb 8 oz (103.2 kg)  Height: 5\' 5"  (1.651 m)    Body mass index is 37.86 kg/m.  No results found.  General: The patient is well-developed and well-nourished and in no acute distress  HEENT:  Head is [place]/AT.    Skin: Extremities are without rash or  edema.   Neurologic Exam  Mental status: The patient is alert and oriented x 3 at the time of the examination. The patient has apparent normal recent and remote memory, with an apparently normal attention span and concentration ability.   Speech is normal.  Cranial nerves: Extraocular movements are full though she noted slight diplopia on far right gaze.  No nystagmus.  There is good facial sensation to soft touch bilaterally.Facial strength is normal.  Trapezius and sternocleidomastoid strength is normal. No dysarthria is noted.  No obvious hearing deficits are noted.  Motor:  Muscle bulk is normal.  Very slight tremor on the left tone is normal. Strength is  5 / 5 in all 4 extremities.   Sensory: Sensory testing is intact to soft touch and vibration sensation in arms and mild reduced vibration in left leg.  Coordination: Finger-nose-finger is slightly reduced in the left arm versus the right arm.  Nutrition was normal  Gait and station: Station is normal.   Gait is mildly wide.  Tandem gait is wide.  Romberg is negative.   Reflexes: Deep tendon reflexes show spread at the knees and increased at the ankles with nonsustained clonus.        DIAGNOSTIC DATA (LABS, IMAGING, TESTING) - I reviewed patient records, labs, notes, testing and imaging myself where available.  Lab Results  Component Value Date   WBC 6.4 08/26/2021   HGB 15.3 08/26/2021   HCT 44.9 08/26/2021   MCV 85 08/26/2021   PLT 250 08/26/2021      Component Value Date/Time   NA 139 04/24/2019 0607   K 3.8 04/24/2019 04/26/2019  CL 104 04/24/2019 0607   CO2 24 04/24/2019 0607   GLUCOSE 81 04/24/2019 0607   BUN 7  04/24/2019 0607   CREATININE 0.74 04/24/2019 0607   CALCIUM 9.0 04/24/2019 0607   PROT 6.0 (L) 04/18/2019 0511   ALBUMIN 3.5 04/18/2019 0511   AST 30 04/18/2019 0511   ALT 43 04/18/2019 0511   ALKPHOS 51 04/18/2019 0511   BILITOT 1.1 04/18/2019 0511   GFRNONAA >60 04/24/2019 0607   GFRAA >60 04/24/2019 0607    Lab Results  Component Value Date   TSH 2.107 04/09/2019       ASSESSMENT AND PLAN  Multiple sclerosis ([place]) - Plan: IgG, IgA, IgM  High risk medication use - Plan: IgG, IgA, IgM  Urinary urgency - Plan: solifenacin (VESICARE) 10 MG tablet  Gait disturbance  Diplopia  Imbalance   1.   Her MS has been stable on OCrevus.  Due to aggressiveness of her initial presentation with multiple cervical spine lesions,  she needs a highly effective  DMT such as Ocrevus or Briumvi.    We will do one g IV Solu-medrol today for fatigue and other symptoms.   We also discussed Kesimpta as another option but she does not think she could do injections.    2.    Continue taking vitamin D.  Try to eat well and exercise as tolerated. 3.    Vesicare for bladder issues.   4.     We will see her back in 6 months or sooner if there are new or worsening neurologic symptoms.    Dharma Pare A. Felecia Shelling, MD, [facility] 5/40/9811, 9:14 AM Certified in Neurology, Clinical Neurophysiology, Sleep Medicine and Neuroimaging  [facility] [address] [place], [place] [address] [phone]

## 2022-02-23 NOTE — Telephone Encounter (Signed)
Pt called and spoke w/ Kim[facility]: "she got the steroid infusion today. She is having new numbness in her right leg"   Per Dr. Felecia Shelling, ok to bring in tomorrow for additional day of IV steroids (1 gram). Maudie Mercury will call pt to schedule. Provided order for tomorrow to [facility].

## 2022-04-14 ENCOUNTER — Ambulatory Visit (INDEPENDENT_AMBULATORY_CARE_PROVIDER_SITE_OTHER): Payer: 59

## 2022-04-14 ENCOUNTER — Encounter: Payer: Self-pay | Admitting: Emergency Medicine

## 2022-04-14 ENCOUNTER — Ambulatory Visit
Admission: EM | Admit: 2022-04-14 | Discharge: 2022-04-14 | Disposition: A | Discharge status: Home / Self Care | Payer: 59 | Attending: Internal Medicine | Admitting: Internal Medicine

## 2022-04-14 ENCOUNTER — Other Ambulatory Visit: Payer: Self-pay

## 2022-04-14 DIAGNOSIS — M25521 Pain in right elbow: Secondary | ICD-10-CM

## 2022-04-14 DIAGNOSIS — M25562 Pain in left knee: Secondary | ICD-10-CM | POA: Diagnosis not present

## 2022-04-14 DIAGNOSIS — M25571 Pain in right ankle and joints of right foot: Secondary | ICD-10-CM

## 2022-04-14 DIAGNOSIS — M25561 Pain in right knee: Secondary | ICD-10-CM

## 2022-04-14 NOTE — Discharge Instructions (Signed)
X-rays were all normal.  Suspect inflammation and bruising.  Recommend ice application.  Follow-up with orthopedist if symptoms persist or worsen. ACE wrap has been applied.  Do not sleep in this.
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
## 2022-07-27 ENCOUNTER — Encounter: Payer: Self-pay | Admitting: *Deleted

## 2022-07-27 ENCOUNTER — Other Ambulatory Visit: Payer: Self-pay

## 2022-07-27 ENCOUNTER — Ambulatory Visit
Admission: EM | Admit: 2022-07-27 | Discharge: 2022-07-27 | Disposition: A | Discharge status: Home / Self Care | Payer: 59 | Attending: Family Medicine | Admitting: Family Medicine

## 2022-07-27 DIAGNOSIS — H6691 Otitis media, unspecified, right ear: Secondary | ICD-10-CM

## 2022-07-27 DIAGNOSIS — R040 Epistaxis: Secondary | ICD-10-CM | POA: Diagnosis not present

## 2022-07-27 DIAGNOSIS — H7291 Unspecified perforation of tympanic membrane, right ear: Secondary | ICD-10-CM | POA: Diagnosis not present

## 2022-07-27 MED ORDER — CEFDINIR 300 MG PO CAPS: 600.0000 mg | ORAL_CAPSULE | Freq: Every day | ORAL | 0 refills | Status: AC

## 2022-07-27 NOTE — ED Provider Notes (Signed)
[name]    CSN: [id number] Arrival date & time: 07/27/22  1300      History   Chief Complaint Chief Complaint  Patient presents with   Epistaxis    HPI Jean Carlson is a 22 y.o. female.    Epistaxis  Here for nosebleeds.  She has had nosebleeds in the past but she has had 9 separate episodes today.  Began earlier this morning when she woke up and coughed.  It will stop and then start again.  She has been blowing her nose and that often will be what gets it going again.  No fever or chills  She did just finish a course of azithromycin for an ear infection.  She states her right ear is still hurting and is draining.  It also has some tinnitus in it.  She does have a history of multiple sclerosis and is on Ocrevus.  Past Medical History:  Diagnosis Date   ADHD    Multiple sclerosis [facility])     Patient Active Problem List   Diagnosis Date Noted   Gait disturbance 04/04/2020   Attention deficit hyperactivity disorder (ADHD) 02/05/2020   High risk medication use 05/03/2019   Vitamin D deficiency 05/03/2019   Depression with anxiety 05/03/2019   Class 1 obesity due to excess calories without serious comorbidity with body mass index (BMI) of 31.0 to 31.9 in adult    Neurogenic bowel    Hypokalemia    Neurogenic bladder 04/18/2019   Demyelinating disorder ([facility]) 04/12/2019   ADHD 04/12/2019   Multiple sclerosis ([facility]) 04/10/2019   Tobacco abuse 04/10/2019    Past Surgical History:  Procedure Laterality Date   WISDOM TOOTH EXTRACTION     x4    OB History   No obstetric history on file.      Home Medications    Prior to Admission medications   Medication Sig Start Date End Date Taking? Authorizing Provider  acetaminophen (TYLENOL) 325 MG tablet Take 2 tablets (650 mg total) by mouth every 6 (six) hours as needed for mild pain (or Fever >/= 101). 04/24/19  Yes Angiulli, Mcarthur Rossetti, PA-C  cefdinir (OMNICEF) 300 MG capsule Take 2 capsules (600 mg  total) by mouth daily for 7 days. 07/27/22 08/03/22 Yes Aiysha Jillson, Janace Aris, MD  cholecalciferol (VITAMIN D3) 25 MCG (1000 UNIT) tablet Take 5,000 Units by mouth daily.   Yes [provider]  etonogestrel-ethinyl estradiol (NUVARING) 0.12-0.015 MG/24HR vaginal ring  01/24/20  Yes [provider]  bisacodyl (DULCOLAX) 5 MG EC tablet Take 2 tablets (10 mg total) by mouth daily after supper. Patient not taking: Reported on 02/23/2022 04/24/19   Angiulli, Mcarthur Rossetti, PA-C  ocrelizumab (OCREVUS) 300 MG/10ML injection Inject into the vein once.    [provider]  solifenacin (VESICARE) 10 MG tablet Take 0.5 tablets (5 mg total) by mouth in the morning and at bedtime. 02/23/22   Sater, Pearletha Furl, MD    Family History Family History  Problem Relation Age of Onset   Hyperlipidemia Maternal Grandmother    Hypertension Maternal Grandmother    Diabetes Paternal Grandfather    Hypertension Paternal Grandfather    Heart failure Paternal Grandfather     Social History Social History   Tobacco Use   Smoking status: Every Day   Smokeless tobacco: Never  Vaping Use   Vaping Use: Every day  Substance Use Topics   Alcohol use: Not Currently   Drug use: Never     Allergies  Amoxicillin, Codeine, and Vyvanse [lisdexamfetamine]   Review of Systems Review of Systems  HENT:  Positive for nosebleeds.      Physical Exam Triage Vital Signs ED Triage Vitals  Enc Vitals Group     BP 07/27/22 1337 120/80     Pulse Rate 07/27/22 1337 98     Resp 07/27/22 1337 20     Temp 07/27/22 1337 97.9 F (36.6 C)     Temp src --      SpO2 07/27/22 1337 93 %     Weight --      Height --      Head Circumference --      Peak Flow --      Pain Score 07/27/22 1333 4     Pain Loc --      Pain Edu? --      Excl. in [place]? --    No data found.  Updated Vital Signs BP 120/80   Pulse 98   Temp 97.9 F (36.6 C)   Resp 20   LMP 06/10/2022   SpO2 93%   Visual Acuity Right Eye  Distance:   Left Eye Distance:   Bilateral Distance:    Right Eye Near:   Left Eye Near:    Bilateral Near:     Physical Exam Vitals reviewed.  Constitutional:      General: She is not in acute distress.    Appearance: She is not ill-appearing, toxic-appearing or diaphoretic.  HENT:     Left Ear: Tympanic membrane and ear canal normal.     Ears:     Comments: The right tympanic membrane is obscured by white copious thin discharge in the right ear canal.  There is no pain on traction of the pinna.    Nose:     Comments: There is dried blood on her nostrils bilaterally.  I cannot see any bleeding site.  There is not active bleeding at this time that I can tell.    Mouth/Throat:     Mouth: Mucous membranes are moist.     Pharynx: No oropharyngeal exudate or posterior oropharyngeal erythema.     Comments: I cannot see blood in the oropharynx. Eyes:     Extraocular Movements: Extraocular movements intact.     Conjunctiva/sclera: Conjunctivae normal.     Pupils: Pupils are equal, round, and reactive to light.  Cardiovascular:     Rate and Rhythm: Normal rate and regular rhythm.     Heart sounds: No murmur heard. Pulmonary:     Effort: Pulmonary effort is normal.     Breath sounds: Normal breath sounds.  Musculoskeletal:     Cervical back: Neck supple.  Lymphadenopathy:     Cervical: No cervical adenopathy.  Skin:    Coloration: Skin is not pale.  Neurological:     Mental Status: She is alert and oriented to person, place, and time.  Psychiatric:        Behavior: Behavior normal.      UC Treatments / Results  Labs (all labs ordered are listed, but only abnormal results are displayed) Labs Reviewed  CBC WITH DIFFERENTIAL/PLATELET    EKG   Radiology No results found.  Procedures Procedures (including critical care time)  Medications Ordered in UC Medications - No data to display  Initial Impression / Assessment and Plan / UC Course  I have reviewed the triage  vital signs and the nursing notes.  Pertinent labs & imaging results that were available during my care  of the patient were reviewed by me and considered in my medical decision making (see chart for details).        She reports an allergy to amoxicillin which consists of nightmares.  I think it is safe them to send in a cephalosporin for her otitis media.  Omnicef is sent in to treat the otitis media.  Afrin nasal spray is applied here in the clinic to both nostrils.  Discussed with her use at home for this problem every 10-12 hours as needed.  Also discussed with her that it should not be used more than 2 or 3 days in a row. We discussed pinching her nose to cause pressure on any bleeding site if bleeding starts again.  If she cannot get it to stop or it is draining down the back of her throat, she is to proceed to the emergency room for further evaluation   I have asked her to follow-up with her primary care as she may end up needing a referral to ENT for her otitis media with perforation.  CBC is drawn today and if anything is abnormal we will let her know.  Final Clinical Impressions(s) / UC Diagnoses   Final diagnoses:  Epistaxis  Acute otitis media with perforated tympanic membrane, right     Discharge Instructions      Take cefdinir 300 mg--2 capsules together daily for 7 days  You can use the Afrin nose spray in each nostril twice a day as you needed for the nosebleed up to 2 or 3 days in a row.  Holding pressure by pinching her nose is probably the best treatment for most nosebleeds.  If you cannot get it to stop or if it is draining down her throat while you are holding pressure, please consider going to the emergency room for further evaluation and treatment  We have drawn a blood count and we will notify you if anything is significantly abnormal  Please follow-up with your primary care about the ear infection.     ED Prescriptions     Medication Sig Dispense  Auth. Provider   cefdinir (OMNICEF) 300 MG capsule Take 2 capsules (600 mg total) by mouth daily for 7 days. 14 capsule Marlinda Mike, Janace Aris, MD      PDMP not reviewed this encounter.   Zenia Resides, MD 07/27/22 [phone]

## 2022-07-27 NOTE — Discharge Instructions (Signed)
Take cefdinir 300 mg--2 capsules together daily for 7 days  You can use the Afrin nose spray in each nostril twice a day as you needed for the nosebleed up to 2 or 3 days in a row.  Holding pressure by pinching her nose is probably the best treatment for most nosebleeds.  If you cannot get it to stop or if it is draining down her throat while you are holding pressure, please consider going to the emergency room for further evaluation and treatment  We have drawn a blood count and we will notify you if anything is significantly abnormal  Please follow-up with your primary care about the ear infection.

## 2022-07-27 NOTE — ED Triage Notes (Signed)
Pt reports she has had 9 nose bleeds today. Pt reports every time she blows her nose her nose bleeds. Pt also has had ringing in her Rt ear for 5 days. Pt reports now she can hear her heart beat in RT ear. Pt was recently treated for Bil. Ear infection by PCP.

## 2022-07-28 LAB — CBC WITH DIFFERENTIAL/PLATELET
Basophils Absolute: 0.1 10*3/uL (ref 0.0–0.2)
Basos: 0 %
EOS (ABSOLUTE): 0.2 10*3/uL (ref 0.0–0.4)
Eos: 2 %
Hematocrit: 39.2 % (ref 34.0–46.6)
Hemoglobin: 13.3 g/dL (ref 11.1–15.9)
Immature Grans (Abs): 0.1 10*3/uL (ref 0.0–0.1)
Immature Granulocytes: 1 %
Lymphocytes Absolute: 2.1 10*3/uL (ref 0.7–3.1)
Lymphs: 16 %
MCH: 27.6 pg (ref 26.6–33.0)
MCHC: 33.9 g/dL (ref 31.5–35.7)
MCV: 81 fL (ref 79–97)
Monocytes Absolute: 0.9 10*3/uL (ref 0.1–0.9)
Monocytes: 7 %
Neutrophils Absolute: 9.4 10*3/uL — ABNORMAL HIGH (ref 1.4–7.0)
Neutrophils: 74 %
Platelets: 354 10*3/uL (ref 150–450)
RBC: 4.82 x10E6/uL (ref 3.77–5.28)
RDW: 13 % (ref 11.7–15.4)
WBC: 12.8 10*3/uL — ABNORMAL HIGH (ref 3.4–10.8)

## 2022-08-11 ENCOUNTER — Ambulatory Visit
Admission: EM | Admit: 2022-08-11 | Discharge: 2022-08-11 | Disposition: A | Discharge status: Home / Self Care | Payer: 59

## 2022-08-11 DIAGNOSIS — H60391 Other infective otitis externa, right ear: Secondary | ICD-10-CM

## 2022-08-11 MED ORDER — CIPROFLOXACIN-DEXAMETHASONE 0.3-0.1 % OT SUSP: 4.0000 [drp] | Freq: Two times a day (BID) | OTIC | 0 refills | Status: AC

## 2022-08-11 NOTE — ED Triage Notes (Signed)
"  Right ear pain with ringing in ear". "I have been here once for this to be treated and it didn't work, I want rx for ear drops". No fever.

## 2022-08-11 NOTE — ED Provider Notes (Signed)
[name]    CSN: [id number] Arrival date & time: 08/11/22  1146      History   Chief Complaint Chief Complaint  Patient presents with   Otalgia    Right. Requests Irrigation.    HPI Jean Carlson is a 22 y.o. female.   HPI Patient presents today for evaluation of right otalgia and right ear ringing .  Patient has been treated with azithromycin and cefdinir for right ear infection reports that symptoms abruptly returned with puslike drainage from the right ear and a stabbing pain.  Reports in the past when she had infection such as this they only have cured with a antibiotic eardrop.  Denies any pain involving the left ear.  She does not have any URI symptoms.  She has not recently been swimming.  Past Medical History:  Diagnosis Date   ADHD    Multiple sclerosis [facility])     Patient Active Problem List   Diagnosis Date Noted   Gait disturbance 04/04/2020   Attention deficit hyperactivity disorder (ADHD) 02/05/2020   High risk medication use 05/03/2019   Vitamin D deficiency 05/03/2019   Depression with anxiety 05/03/2019   Class 1 obesity due to excess calories without serious comorbidity with body mass index (BMI) of 31.0 to 31.9 in adult    Neurogenic bowel    Hypokalemia    Neurogenic bladder 04/18/2019   Demyelinating disorder (HCC) 04/12/2019   ADHD 04/12/2019   Multiple sclerosis (HCC) 04/10/2019   Tobacco abuse 04/10/2019    Past Surgical History:  Procedure Laterality Date   WISDOM TOOTH EXTRACTION     x4    OB History   No obstetric history on file.      Home Medications    Prior to Admission medications   Medication Sig Start Date End Date Taking? Authorizing Provider  acetaminophen (TYLENOL) 325 MG tablet Take 2 tablets (650 mg total) by mouth every 6 (six) hours as needed for mild pain (or Fever >/= 101). 04/24/19  Yes Angiulli, Mcarthur Rossetti, PA-C  cholecalciferol (VITAMIN D3) 25 MCG (1000 UNIT) tablet Take 5,000 Units by mouth  daily.   Yes [provider]  ciprofloxacin-dexamethasone (CIPRODEX) OTIC suspension Place 4 drops into the right ear 2 (two) times daily for 10 days. 08/11/22 08/21/22 Yes Bing Neighbors, NP  etonogestrel-ethinyl estradiol Lavella Lemons) 0.12-0.015 MG/24HR vaginal ring  01/24/20  Yes [provider]  FLUoxetine (PROZAC) 40 MG capsule Take 40 mg by mouth daily. 07/17/22  Yes [provider]  methylphenidate 36 MG PO CR tablet Take 36 mg by mouth every morning. 07/17/22  Yes [provider]  bisacodyl (DULCOLAX) 5 MG EC tablet Take 2 tablets (10 mg total) by mouth daily after supper. Patient not taking: Reported on 02/23/2022 04/24/19   Angiulli, Mcarthur Rossetti, PA-C  ocrelizumab (OCREVUS) 300 MG/10ML injection Inject into the vein once.    [provider]  solifenacin (VESICARE) 10 MG tablet Take 0.5 tablets (5 mg total) by mouth in the morning and at bedtime. 02/23/22   Sater, Pearletha Furl, MD    Family History Family History  Problem Relation Age of Onset   Hyperlipidemia Maternal Grandmother    Hypertension Maternal Grandmother    Diabetes Paternal Grandfather    Hypertension Paternal Grandfather    Heart failure Paternal Grandfather     Social History Social History   Tobacco Use   Smoking status: Never   Smokeless tobacco: Never  Vaping Use   Vaping status: Every  Day   Substances: Nicotine, Flavoring  Substance Use Topics   Alcohol use: Not Currently   Drug use: Never     Allergies   Amoxicillin, Codeine, and Vyvanse [lisdexamfetamine]   Review of Systems Review of Systems Pertinent negatives listed in HPI  Physical Exam Triage Vital Signs ED Triage Vitals  Encounter Vitals Group     BP 08/11/22 1256 128/82     Systolic BP Percentile --      Diastolic BP Percentile --      Pulse Rate 08/11/22 1256 79     Resp 08/11/22 1256 16     Temp 08/11/22 1256 98 F (36.7 C)     Temp Source 08/11/22 1256 Oral     SpO2 08/11/22 1256 98 %      Weight 08/11/22 1253 217 lb (98.4 kg)     Height 08/11/22 1253 5\' 5"  (1.651 m)     Head Circumference --      Peak Flow --      Pain Score 08/11/22 1253 4     Pain Loc --      Pain Education --      Exclude from Growth Chart --    No data found.  Updated Vital Signs BP 128/82 (BP Location: Left Arm)   Pulse 79   Temp 98 F (36.7 C) (Oral)   Resp 16   Ht 5\' 5"  (1.651 m)   Wt 217 lb (98.4 kg)   LMP 08/03/2022 (Approximate)   SpO2 98%   BMI 36.11 kg/m   Visual Acuity Right Eye Distance:   Left Eye Distance:   Bilateral Distance:    Right Eye Near:   Left Eye Near:    Bilateral Near:     Physical Exam Vitals reviewed.  Constitutional:      Appearance: Normal appearance.  HENT:     Head: Normocephalic and atraumatic.     Right Ear: Drainage, swelling and tenderness present. Tympanic membrane is not erythematous.     Left Ear: Hearing, tympanic membrane, ear canal and external ear normal.  Eyes:     Extraocular Movements: Extraocular movements intact.     Conjunctiva/sclera: Conjunctivae normal.     Pupils: Pupils are equal, round, and reactive to light.  Cardiovascular:     Rate and Rhythm: Normal rate and regular rhythm.  Pulmonary:     Effort: Pulmonary effort is normal.     Breath sounds: Normal breath sounds.  Neurological:     General: No focal deficit present.     Mental Status: She is alert.      UC Treatments / Results  Labs (all labs ordered are listed, but only abnormal results are displayed) Labs Reviewed - No data to display  EKG   Radiology No results found.  Procedures Procedures (including critical care time)  Medications Ordered in UC Medications - No data to display  Initial Impression / Assessment and Plan / UC Course  I have reviewed the triage vital signs and the nursing notes.  Pertinent labs & imaging results that were available during my care of the patient were reviewed by me and considered in my medical decision making  (see chart for details).    Chronic bacterial otitis externa, ongoing for approximately 2-1/2 months with brief periods of resolution. Treat with Ciprodex drops twice daily in ear for 10 days.  Advised that if symptoms persist to follow-up with the ENT listed. Final Clinical Impressions(s) / UC Diagnoses   Final diagnoses:  Chronic  bacterial otitis externa, right   Discharge Instructions   None    ED Prescriptions     Medication Sig Dispense Auth. Provider   ciprofloxacin-dexamethasone (CIPRODEX) OTIC suspension Place 4 drops into the right ear 2 (two) times daily for 10 days. 4 mL Bing Neighbors, NP      PDMP not reviewed this encounter.   Bing Neighbors, NP 08/11/22 1606

## 2022-08-12 ENCOUNTER — Telehealth: Payer: Self-pay | Admitting: *Deleted

## 2022-08-12 DIAGNOSIS — Z79899 Other long term (current) drug therapy: Secondary | ICD-10-CM

## 2022-08-12 DIAGNOSIS — G35D Multiple sclerosis, unspecified: Secondary | ICD-10-CM

## 2022-08-12 DIAGNOSIS — G35 Multiple sclerosis: Secondary | ICD-10-CM

## 2022-08-12 NOTE — Telephone Encounter (Signed)
I called pt and relayed that per intrafusion she needs labs drawn prior to her next infusion in 09-10-2022.  She said she will come by tomorrow.

## 2022-08-13 ENCOUNTER — Other Ambulatory Visit (INDEPENDENT_AMBULATORY_CARE_PROVIDER_SITE_OTHER): Payer: Self-pay

## 2022-08-13 DIAGNOSIS — Z0289 Encounter for other administrative examinations: Secondary | ICD-10-CM

## 2022-08-13 DIAGNOSIS — G35 Multiple sclerosis: Secondary | ICD-10-CM

## 2022-08-13 DIAGNOSIS — Z79899 Other long term (current) drug therapy: Secondary | ICD-10-CM

## 2022-08-14 LAB — IGG, IGA, IGM
IgA/Immunoglobulin A, Serum: 195 mg/dL (ref 87–352)
IgG (Immunoglobin G), Serum: 636 mg/dL (ref 586–1602)
IgM (Immunoglobulin M), Srm: <5 mg/dL — ABNORMAL LOW (ref 26–217)

## 2022-08-27 ENCOUNTER — Telehealth: Payer: Self-pay | Admitting: *Deleted

## 2022-08-27 NOTE — Telephone Encounter (Signed)
I called and LMVM for her that lab results 1 antibody IgM had dropped further.  Dr. Epimenio Foot wanted to let you know that would decrease dose of ocrevus wo 300mg  evey 6 months.  She is to call back if questions.

## 2022-08-27 NOTE — Telephone Encounter (Signed)
-----   Message from Asa Lente sent at 08/21/2022 10:31 AM EDT ----- Please let her know that the 1 type of antibody we check, IgM, has dropped further.  Therefore, we need to make an adjustment in the Ocrevus dose.  We will keep it at every 6 months.  Please let the infusion service know that I want to cut the dose to 300 mg every 6 months.

## 2022-10-30 ENCOUNTER — Ambulatory Visit: Payer: 59

## 2022-10-30 ENCOUNTER — Ambulatory Visit
Admission: EM | Admit: 2022-10-30 | Discharge: 2022-10-30 | Disposition: A | Discharge status: Home / Self Care | Payer: 59 | Attending: Internal Medicine | Admitting: Internal Medicine

## 2022-10-30 DIAGNOSIS — J069 Acute upper respiratory infection, unspecified: Secondary | ICD-10-CM

## 2022-10-30 DIAGNOSIS — J189 Pneumonia, unspecified organism: Secondary | ICD-10-CM

## 2022-10-30 DIAGNOSIS — R053 Chronic cough: Secondary | ICD-10-CM | POA: Diagnosis not present

## 2022-10-30 MED ORDER — CEFPODOXIME PROXETIL 100 MG PO TABS: 200.0000 mg | ORAL_TABLET | Freq: Two times a day (BID) | ORAL | 0 refills | Status: AC

## 2022-10-30 MED ORDER — BENZONATATE 100 MG PO CAPS: 100.0000 mg | ORAL_CAPSULE | Freq: Three times a day (TID) | ORAL | 0 refills | Status: DC | PRN

## 2022-10-30 MED ORDER — DOXYCYCLINE HYCLATE 100 MG PO CAPS: 100.0000 mg | ORAL_CAPSULE | Freq: Two times a day (BID) | ORAL | 0 refills | Status: AC

## 2022-10-30 MED ORDER — ALBUTEROL SULFATE HFA 108 (90 BASE) MCG/ACT IN AERS: 1.0000 | INHALATION_SPRAY | Freq: Four times a day (QID) | RESPIRATORY_TRACT | 0 refills | Status: DC | PRN

## 2022-10-30 NOTE — Discharge Instructions (Signed)
X-ray is pending.  I will call you if results are abnormal.  I have prescribed antibiotic, cough medication, inhaler to take for symptoms.  Follow-up if any symptoms persist or worsen.

## 2022-10-30 NOTE — ED Provider Notes (Signed)
Jean Carlson    CSN: [id number] Arrival date & time: 10/30/22  1437      History   Chief Complaint Chief Complaint  Patient presents with   Cough   Shortness of Breath    HPI Jean Carlson is a 22 y.o. female.   Patient presents with cough, shortness of breath, nasal congestion that started about 2 weeks ago.  Patient states this is the first time they have been evaluated since symptoms started.  Cough is productive with chest congestion.  Shortness of breath is intermittent.  They have been using Advair but denies a history of asthma.  They do vape but denies that they smoke cigarettes.  Have also been taking Mucinex with minimal improvement.  Denies any known sick contacts, fever, chills.   Cough Shortness of Breath   Past Medical History:  Diagnosis Date   ADHD    Multiple sclerosis (HCC)     Patient Active Problem List   Diagnosis Date Noted   Gait disturbance 04/04/2020   Attention deficit hyperactivity disorder (ADHD) 02/05/2020   High risk medication use 05/03/2019   Vitamin D deficiency 05/03/2019   Depression with anxiety 05/03/2019   Class 1 obesity due to excess calories without serious comorbidity with body mass index (BMI) of 31.0 to 31.9 in adult    Neurogenic bowel    Hypokalemia    Neurogenic bladder 04/18/2019   Demyelinating disorder (HCC) 04/12/2019   ADHD 04/12/2019   Multiple sclerosis (HCC) 04/10/2019   Tobacco abuse 04/10/2019    Past Surgical History:  Procedure Laterality Date   WISDOM TOOTH EXTRACTION     x4    OB History   No obstetric history on file.      Home Medications    Prior to Admission medications   Medication Sig Start Date End Date Taking? Authorizing Provider  albuterol (VENTOLIN HFA) 108 (90 Base) MCG/ACT inhaler Inhale 1-2 puffs into the lungs every 6 (six) hours as needed for wheezing or shortness of breath. 10/30/22  Yes Jasper Hanf, Rolly Salter E, FNP  benzonatate (TESSALON) 100 MG capsule Take 1 capsule  (100 mg total) by mouth every 8 (eight) hours as needed for cough. 10/30/22  Yes Yussuf Sawyers, Acie Fredrickson, FNP  cefpodoxime (VANTIN) 100 MG tablet Take 2 tablets (200 mg total) by mouth 2 (two) times daily for 5 days. 10/30/22 11/04/22 Yes Leda Bellefeuille, Acie Fredrickson, FNP  doxycycline (VIBRAMYCIN) 100 MG capsule Take 1 capsule (100 mg total) by mouth 2 (two) times daily for 7 days. 10/30/22 11/06/22 Yes Hailly Fess, Acie Fredrickson, FNP  FLUoxetine (PROZAC) 40 MG capsule Take 40 mg by mouth daily. 07/17/22  Yes [provider]  methylphenidate 36 MG PO CR tablet Take 36 mg by mouth every morning. 07/17/22  Yes [provider]  ocrelizumab (OCREVUS) 300 MG/10ML injection Inject into the vein once.   Yes [provider]  solifenacin (VESICARE) 10 MG tablet Take 0.5 tablets (5 mg total) by mouth in the morning and at bedtime. 02/23/22  Yes Sater, Pearletha Furl, MD  acetaminophen (TYLENOL) 325 MG tablet Take 2 tablets (650 mg total) by mouth every 6 (six) hours as needed for mild pain (or Fever >/= 101). 04/24/19   Angiulli, Mcarthur Rossetti, PA-C  bisacodyl (DULCOLAX) 5 MG EC tablet Take 2 tablets (10 mg total) by mouth daily after supper. Patient not taking: Reported on 02/23/2022 04/24/19   Angiulli, Mcarthur Rossetti, PA-C  cholecalciferol (VITAMIN D3) 25 MCG (1000 UNIT) tablet Take 5,000 Units by mouth  daily.    [provider]  etonogestrel-ethinyl estradiol Lavella Lemons) 0.12-0.015 MG/24HR vaginal ring  01/24/20   [provider]    Family History Family History  Problem Relation Age of Onset   Hyperlipidemia Maternal Grandmother    Hypertension Maternal Grandmother    Diabetes Paternal Grandfather    Hypertension Paternal Grandfather    Heart failure Paternal Grandfather     Social History Social History   Tobacco Use   Smoking status: Never   Smokeless tobacco: Never  Vaping Use   Vaping status: Every Day   Substances: Nicotine, Flavoring  Substance Use Topics   Alcohol use: Not Currently   Drug use:  Never     Allergies   Amoxicillin, Codeine, and Vyvanse [lisdexamfetamine]   Review of Systems Review of Systems Per HPI  Physical Exam Triage Vital Signs ED Triage Vitals  Encounter Vitals Group     BP 10/30/22 1449 102/71     Systolic BP Percentile --      Diastolic BP Percentile --      Pulse Rate 10/30/22 1449 88     Resp 10/30/22 1449 18     Temp 10/30/22 1449 98 F (36.7 C)     Temp Source 10/30/22 1449 Oral     SpO2 10/30/22 1449 95 %     Weight 10/30/22 1449 216 lb 14.9 oz (98.4 kg)     Height 10/30/22 1449 5\' 5"  (1.651 m)     Head Circumference --      Peak Flow --      Pain Score 10/30/22 1446 4     Pain Loc --      Pain Education --      Exclude from Growth Chart --    No data found.  Updated Vital Signs BP 102/71 (BP Location: Left Arm)   Pulse 88   Temp 98 F (36.7 C) (Oral)   Resp 18   Ht 5\' 5"  (1.651 m)   Wt 216 lb 14.9 oz (98.4 kg)   LMP 10/12/2022 (Approximate)   SpO2 95%   BMI 36.10 kg/m   Visual Acuity Right Eye Distance:   Left Eye Distance:   Bilateral Distance:    Right Eye Near:   Left Eye Near:    Bilateral Near:     Physical Exam Constitutional:      General: She is not in acute distress.    Appearance: Normal appearance. She is not toxic-appearing or diaphoretic.  HENT:     Head: Normocephalic and atraumatic.     Right Ear: Tympanic membrane and ear canal normal.     Left Ear: Tympanic membrane and ear canal normal.     Nose: Congestion and rhinorrhea present. Rhinorrhea is purulent.     Mouth/Throat:     Mouth: Mucous membranes are moist.     Pharynx: No posterior oropharyngeal erythema.  Eyes:     Extraocular Movements: Extraocular movements intact.     Conjunctiva/sclera: Conjunctivae normal.     Pupils: Pupils are equal, round, and reactive to light.  Cardiovascular:     Rate and Rhythm: Normal rate and regular rhythm.     Pulses: Normal pulses.     Heart sounds: Normal heart sounds.  Pulmonary:     Effort:  Pulmonary effort is normal. No respiratory distress.     Breath sounds: Normal breath sounds. No stridor. No wheezing, rhonchi or rales.  Abdominal:     General: Abdomen is flat. Bowel sounds are normal.  Palpations: Abdomen is soft.  Musculoskeletal:        General: Normal range of motion.     Cervical back: Normal range of motion.  Skin:    General: Skin is warm and dry.  Neurological:     General: No focal deficit present.     Mental Status: She is alert and oriented to person, place, and time. Mental status is at baseline.  Psychiatric:        Mood and Affect: Mood normal.        Behavior: Behavior normal.      UC Treatments / Results  Labs (all labs ordered are listed, but only abnormal results are displayed) Labs Reviewed - No data to display  EKG   Radiology DG Chest 2 View  Result Date: 10/30/2022 CLINICAL DATA:  Cough and shortness of breath. EXAM: CHEST - 2 VIEW COMPARISON:  04/10/2019 FINDINGS: Lingular and left lower lobe opacity consistent with pneumonia. The right lung is clear. The heart is normal in size. No pulmonary edema. No definite pleural effusion. No pneumothorax. No acute osseous findings. IMPRESSION: Lingular and left lower lobe opacity consistent with pneumonia. Electronically Signed   By: Narda Rutherford M.D.   On: 10/30/2022 17:25    Procedures Procedures (including critical Carlson time)  Medications Ordered in UC Medications - No data to display  Initial Impression / Assessment and Plan / UC Course  I have reviewed the triage vital signs and the nursing notes.  Pertinent labs & imaging results that were available during my Carlson of the patient were reviewed by me and considered in my medical decision making (see chart for details).     Patient's chest x-ray showing left lower lobe pneumonia.  Will prescribe doxycycline and cefpodoxime.  Patient has intolerance to penicillin but has tolerated cephalosporins previously.  Albuterol inhaler also  prescribed to take as needed for shortness of breath.  Benzonatate prescribed to take as needed for cough.  Advised her to follow-up in 4 to 6 weeks to have repeat chest x-ray to ensure infection is clear.  Patient's vital signs are stable with no tachypnea and oxygen is normal so do think that outpatient treatment is reasonable.  Also advised strict return and ER precautions.  Patient verbalized understanding and was agreeable with plan.  Called patient and discussed x-ray results with her. Final Clinical Impressions(s) / UC Diagnoses   Final diagnoses:  Acute upper respiratory infection  Persistent cough     Discharge Instructions      X-ray is pending.  I will call you if results are abnormal.  I have prescribed antibiotic, cough medication, inhaler to take for symptoms.  Follow-up if any symptoms persist or worsen.    ED Prescriptions     Medication Sig Dispense Auth. Provider   doxycycline (VIBRAMYCIN) 100 MG capsule Take 1 capsule (100 mg total) by mouth 2 (two) times daily for 7 days. 14 capsule [place], [place] E, [place]   albuterol (VENTOLIN HFA) 108 (90 Base) MCG/ACT inhaler Inhale 1-2 puffs into the lungs every 6 (six) hours as needed for wheezing or shortness of breath. 1 each [place], [place] E, [place]   benzonatate (TESSALON) 100 MG capsule Take 1 capsule (100 mg total) by mouth every 8 (eight) hours as needed for cough. 21 capsule [place], [place] E, [place]   cefpodoxime (VANTIN) 100 MG tablet Take 2 tablets (200 mg total) by mouth 2 (two) times daily for 5 days. 20 tablet [place], Acie Fredrickson, [place]  PDMP not reviewed this encounter.   Gustavus Bryant, [place] 10/30/22 [phone]

## 2022-10-30 NOTE — ED Triage Notes (Signed)
Cough, shortness of breath, muscle spasms in the back, emesis with the cough, chest congestion, unable to lay flat, and chest discomfort/tightness onset with worsening symptoms 2 weeks ago but had been ongoing before that.   Has no history of childhood asthma. No known sick exposure. Patient tried Mucinex, Severe Cough and congestion with slight relief.

## 2022-11-19 ENCOUNTER — Telehealth: Payer: Self-pay | Admitting: Neurology

## 2022-11-19 NOTE — Telephone Encounter (Signed)
Called pt back and let her know per Dr. Epimenio Foot that he wants for her to come in on 11/23/2022 @11am  instead of giving her steroids. Pt stated that she will come in.

## 2022-11-19 NOTE — Telephone Encounter (Signed)
Pt having an MS flare up having nerve pain radiant from neck to feet, balance difficulty, slurred speech. Would like a call from the nurse to discuss an work in appt.

## 2022-11-19 NOTE — Telephone Encounter (Signed)
Last seen 02/23/22. No f/u scheduled currently.   MS DMT: Ocrevus 300mg  q 6 months. Last infusion 09/10/2022. Receives w/ intrafusion  Called pt to discuss. Recently treated 10/30/22 for pneumonia. Waiting on updated chest xray results from last week to see if this is cleared up. She states she also has Reactive airway disease.   Sx started at 3am last night at work. Tried ibuprofen, not effective. Taking 800-1600mg  at a time. Nerve pain severe, affecting her job. Goes to work at [employer] No personal history of diabetes.   Scheduled appt with Dr. Epimenio Foot for 11/23/22 at 11am.  Updated pharmacy on file.   She is asking for standing order of IV steroids. Advised Dr. Epimenio Foot does not normally provide this.

## 2022-11-23 ENCOUNTER — Ambulatory Visit (INDEPENDENT_AMBULATORY_CARE_PROVIDER_SITE_OTHER): Payer: 59 | Admitting: Neurology

## 2022-11-23 ENCOUNTER — Encounter: Payer: Self-pay | Admitting: Neurology

## 2022-11-23 VITALS — BP 110/73 | HR 93 | Ht 65.0 in | Wt 203.5 lb

## 2022-11-23 DIAGNOSIS — F909 Attention-deficit hyperactivity disorder, unspecified type: Secondary | ICD-10-CM

## 2022-11-23 DIAGNOSIS — R899 Unspecified abnormal finding in specimens from other organs, systems and tissues: Secondary | ICD-10-CM | POA: Diagnosis not present

## 2022-11-23 DIAGNOSIS — Z79899 Other long term (current) drug therapy: Secondary | ICD-10-CM | POA: Diagnosis not present

## 2022-11-23 DIAGNOSIS — R269 Unspecified abnormalities of gait and mobility: Secondary | ICD-10-CM

## 2022-11-23 DIAGNOSIS — Z8701 Personal history of pneumonia (recurrent): Secondary | ICD-10-CM

## 2022-11-23 DIAGNOSIS — G35 Multiple sclerosis: Secondary | ICD-10-CM | POA: Diagnosis not present

## 2022-11-23 DIAGNOSIS — N319 Neuromuscular dysfunction of bladder, unspecified: Secondary | ICD-10-CM

## 2022-11-23 NOTE — Progress Notes (Addendum)
[facility]  PATIENT: Jean Carlson DOB: 2000-02-09  REFERRING DOCTOR OR PCP:  Milus Height, PA SOURCE: Patient, notes from [facility], imaging and lab reports, MRI images personally reviewed.  _________________________________   HISTORICAL  CHIEF COMPLAINT:  Chief Complaint  Patient presents with   Follow-up    Pt in room 11, dad in room. Here for MS follow up. DMT: Emogene Morgan. Pt wants discuss standing order for IV steroids. Pt said month 5 she starts to having symptoms, balance issues, nerve pain. Pt currently has those symptoms.    HISTORY OF PRESENT ILLNESS:  Jean Carlson is a 22 y.o.  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  Update 11/23/2022 She had a pneumonia and was given antibiotics.     She had lingular and LLL opacity c/w pneumonia.   She is feeling better and is back at work.    However, she reports her nerve pain is much worse.  This is bilateral and in her hands and feet.      She is on Ocrevus and tolerates it well.  Last infusion was 09/10/2022  She denies exacerbations since last visit and is fairly stable.     She has felt best when we do an additional day of IV Solumedrol around month 5.     08/13/2022 had low IgM <5 started out normal at 46)  She denies any major new neurologic symptoms.    However, the last month of each cycle, , she has noted more issues wtih balance, tremors and diplopia.  She also has more fatigue.   Last cycle an extra dose of IV Solumedrol a couple weeks before her infusion helped her with the symptoms.  We discussed that some patients reprot a "crap gap" but the effect of the medicne on her B cells persists the whole 6 months (last B cell count at 5+ months was 0).     Diplopia resolved.   Symptoms vary more with fatigue and stress rather than heat..   She sleeps well most nights.   Poor sleep can trigger having more symptoms that day.  She had similar symptoms for about a month before her last 2 Ocrevus infusion.  Most nights she  sleeps well - sleep onset > sleep maintenance.   She has a lot of thoughts in her mind at those times.    Gait and balance are doing better though not baseline.   She will ise the banister if she needs to hold items with the other hand.    No falls.   Her left arm is slightly weaker, especially in the wrist.   She has some reduced keyboarding due to a mild left tremor.    Fortunately, she is right handed and writing is not affected.       She has urinary urgency .  [name]      She has mild diplopia.   Vision is doing well.  Focus and attention are doing better and she is not using Concerta.   She has some emotional lability and sometimes feels rage.     Prozac had not helped.   She has rare crying spells .  No inappropriate laughter.  She usually needs 9-10 hours of sleep to feel refreshed.     She takes 5000 U daily Vit D.  Level was good last visit..   We discussed healthier diet like the Mediterranean.    Stay has an active job and walks 7500 steps or so many day  MS  History: In March 2021, she had the onset of slurred speech,left hand tremor and clumsiness, abnormal eye movements on the right and urinary incontinence.   She went to the ED.    She was admitted and received 5 days of IV Solu-Medrol.   While in the hospital she started to note improvement and she has continued to improve.    In retrospect, in February 2021, she had vertigo x 3 weeks and had several falls, one hyperextending her left knee.   She saw orthopedics.   Also in late 2020, she had a couple weeks of fluctuating numbness in her left hand.  MRI showed enhancing lesions in the pons, medulla and spinal cord (and several other nonenhancing brainstem and spinal cord.   She was started on Ocrevus 05/2019.     MRIs of the brain and cervical spine dated 04/09/2019.  The MRI of the brain shows multiple T2/FLAIR hyperintense foci in the hemispheres, brainstem and cerebellum.  2 foci in the pons and medulla enhance after contrast.  There are also  several foci in the hemispheres that enhanced.  There are several foci within the spinal cord.  These are located at C2, more to the right, C3, towards the right and centrally and C4-C5 centrally.  There is subtle enhancement of the C4C5 focus.    MRI of the brain 07/11/2020 showed multiple T2/FLAIR hyperintense foci in the medulla and pons.  Additional foci were noted in the periventricular and deep white matter.  None of the foci enhance.  Compared to the MRI from 04/09/2019, there were no new lesions and the many enhancing lesions on the previous scan no longer enhance and many are smaller in size.  MRI brian and cervical spine 09/17/2021 showed no new lesions.  She has muliple spinal lesions  There is no family history of MS or other autoimmune diseases.   REVIEW OF SYSTEMS: Constitutional: No fevers, chills, sweats, or change in appetite. Eyes: No visual changes, double vision, eye pain Ear, nose and throat: No hearing loss, ear pain, nasal congestion, sore throat Cardiovascular: No chest pain, palpitations Respiratory:  No shortness of breath at rest or with exertion.   No wheezes GastrointestinaI: No nausea, vomiting, diarrhea, abdominal pain, fecal incontinence Genitourinary:  No dysuria, urinary retention or frequency.  No incontinence or nocturia.   Musculoskeletal:  No neck pain, back pain Integumentary: No rash, pruritus, skin lesions Neurological: as above Psychiatric: As above Endocrine: No palpitations, diaphoresis, change in appetite, change in weigh or increased thirst Hematologic/Lymphatic:  No anemia, purpura, petechiae. Allergic/Immunologic: No itchy/runny eyes, nasal congestion, recent allergic reactions, rashes  ALLERGIES: Allergies  Allergen Reactions   Amoxicillin Other (See Comments)    CAUSED BAD NIGHTMARES   Codeine Nausea Only    SEVERE NAUSEA   Vyvanse [Lisdexamfetamine] Other (See Comments)    "Caused depression to the point of self-harm"    HOME  MEDICATIONS:  Current Outpatient Medications:    acetaminophen (TYLENOL) 325 MG tablet, Take 2 tablets (650 mg total) by mouth every 6 (six) hours as needed for mild pain (or Fever >/= 101)., Disp:  , Rfl:    albuterol (VENTOLIN HFA) 108 (90 Base) MCG/ACT inhaler, Inhale 1-2 puffs into the lungs every 6 (six) hours as needed for wheezing or shortness of breath., Disp: 1 each, Rfl: 0   bisacodyl (DULCOLAX) 5 MG EC tablet, Take 2 tablets (10 mg total) by mouth daily after supper. (Patient taking differently: Take 10 mg by mouth daily as needed.), Disp:  30 tablet, Rfl: 0   cholecalciferol (VITAMIN D3) 25 MCG (1000 UNIT) tablet, Take 5,000 Units by mouth daily., Disp: , Rfl:    etonogestrel-ethinyl estradiol (NUVARING) 0.12-0.015 MG/24HR vaginal ring, , Disp: , Rfl:    FLUoxetine (PROZAC) 40 MG capsule, Take 40 mg by mouth daily., Disp: , Rfl:    methylphenidate 36 MG PO CR tablet, Take 36 mg by mouth every morning., Disp: , Rfl:    ocrelizumab (OCREVUS) 300 MG/10ML injection, Inject into the vein once., Disp: , Rfl:    solifenacin (VESICARE) 10 MG tablet, Take 0.5 tablets (5 mg total) by mouth in the morning and at bedtime., Disp: 90 tablet, Rfl: 3   benzonatate (TESSALON) 100 MG capsule, Take 1 capsule (100 mg total) by mouth every 8 (eight) hours as needed for cough. (Patient not taking: Reported on 11/23/2022), Disp: 21 capsule, Rfl: 0  PAST MEDICAL HISTORY: Past Medical History:  Diagnosis Date   ADHD    Multiple sclerosis (HCC)     PAST SURGICAL HISTORY: Past Surgical History:  Procedure Laterality Date   WISDOM TOOTH EXTRACTION     x4    FAMILY HISTORY: Family History  Problem Relation Age of Onset   Hyperlipidemia Maternal Grandmother    Hypertension Maternal Grandmother    Diabetes Paternal Grandfather    Hypertension Paternal Grandfather    Heart failure Paternal Grandfather     SOCIAL HISTORY:  Social History   Socioeconomic History   Marital status: Single     Spouse name: Not on file   Number of children: Not on file   Years of education: Not on file   Highest education level: Not on file  Occupational History    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  Tobacco Use   Smoking status: Never   Smokeless tobacco: Never  Vaping Use   Vaping status: Every Day   Substances: Nicotine, Flavoring  Substance and Sexual Activity   Alcohol use: Not Currently   Drug use: Never   Sexual activity: Not Currently  Other Topics Concern   Not on file  Social History Narrative   Right handed   Caffeine use: tea and soda daily   Social Determinants of Health   Financial Resource Strain: Not on file  Food Insecurity: Not on file  Transportation Needs: Not on file  Physical Activity: Not on file  Stress: Not on file  Social Connections: Unknown (06/10/2021)   Received from [facility], [facility]   Social Network    Social Network: Not on file  Intimate Partner Violence: Unknown (05/02/2021)   Received from [facility], [facility]   HITS    Physically Hurt: Not on file    Insult or Talk Down To: Not on file    Threaten Physical Harm: Not on file    Scream or Curse: Not on file     PHYSICAL EXAM  Vitals:   11/23/22 1037  BP: 110/73  Pulse: 93  Weight: 203 lb 8 oz (92.3 kg)  Height: 5\' 5"  (1.651 m)    Body mass index is 33.86 kg/m.  No results found.  General: The patient is well-developed and well-nourished and in no acute distress  HEENT:  Head is [place]/AT.    Skin: Extremities are without rash or  edema.   Neurologic Exam  Mental status: The patient is alert and oriented x 3 at the time of the examination. The patient has apparent normal recent and remote memory, with an apparently normal attention span and concentration  ability.   Speech is normal.  Cranial nerves: Extraocular movements are full though she noted slight diplopia on far right gaze.[name]  No nystagmus.  There is good facial sensation to soft touch bilaterally.Facial  strength is normal.  Trapezius and sternocleidomastoid strength is normal. No dysarthria is noted.  No obvious hearing deficits are noted.  Motor:  Muscle bulk is normal.  Very slight tremor on the left tone is normal. Strength is  5 / 5 in all 4 extremities.   Sensory: Sensory testing is intact to soft touch and vibration sensation in arms and mild reduced vibration in left leg.  Coordination: Finger-nose-finger is slightly reduced in the left arm versus the right arm.  Nutrition was normal  Gait and station: Station is normal.   Gait is mildly wide.  Tandem gait is wide.  Romberg is negative.   Reflexes: Deep tendon reflexes show spread at the knees and increased at the ankles with nonsustained clonus.        DIAGNOSTIC DATA (LABS, IMAGING, TESTING) - I reviewed patient records, labs, notes, testing and imaging myself where available.  Lab Results  Component Value Date   WBC 12.8 (H) 07/27/2022   HGB 13.3 07/27/2022   HCT 39.2 07/27/2022   MCV 81 07/27/2022   PLT 354 07/27/2022      Component Value Date/Time   NA 139 04/24/2019 0607   K 3.8 04/24/2019 0607   CL 104 04/24/2019 0607   CO2 24 04/24/2019 0607   GLUCOSE 81 04/24/2019 0607   BUN 7 04/24/2019 0607   CREATININE 0.74 04/24/2019 0607   CALCIUM 9.0 04/24/2019 0607   PROT 6.0 (L) 04/18/2019 0511   ALBUMIN 3.5 04/18/2019 0511   AST 30 04/18/2019 0511   ALT 43 04/18/2019 0511   ALKPHOS 51 04/18/2019 0511   BILITOT 1.1 04/18/2019 0511   GFRNONAA >60 04/24/2019 0607   GFRAA >60 04/24/2019 0607    Lab Results  Component Value Date   TSH 2.107 04/09/2019       ASSESSMENT AND PLAN  Multiple sclerosis (HCC) - Plan: IgG, IgA, IgM, CBC with Differential/Platelet  High risk medication use - Plan: IgG, IgA, IgM, CBC with Differential/Platelet  Abnormal laboratory test - Plan: IgG, IgA, IgM, CBC with Differential/Platelet  Attention deficit hyperactivity disorder (ADHD), unspecified ADHD type  Gait  disturbance  Neurogenic bladder  History of recent pneumonia   1.   Her MS has been stable on OCrevus.  However, IgM was low -- wemay need to consider a different DMT (would consider Mavenclad or an S1P modulator.  She is JCV Ab positive so can't do Tysabri.    We will do one g IV Solu-medrol today for fatigue and other symptoms.    2.    Continue taking vitamin D.  Try to eat well and exercise as tolerated. 3.    Vesicare for bladder issues.   4.    We will see her back in 6 months or sooner if there are new or worsening neurologic symptoms.    Megha Agnes A. Epimenio Foot, MD, [facility] 11/23/2022, 11:30 AM Certified in Neurology, Clinical Neurophysiology, Sleep Medicine and Neuroimaging  [facility] [address], [address] [place], [place] [address] [phone]

## 2022-11-24 LAB — CBC WITH DIFFERENTIAL/PLATELET
Basophils Absolute: 0 10*3/uL (ref 0.0–0.2)
Basos: 1 %
EOS (ABSOLUTE): 0.4 10*3/uL (ref 0.0–0.4)
Eos: 5 %
Hematocrit: 41.8 % (ref 34.0–46.6)
Hemoglobin: 13.4 g/dL (ref 11.1–15.9)
Immature Grans (Abs): 0 10*3/uL (ref 0.0–0.1)
Immature Granulocytes: 0 %
Lymphocytes Absolute: 2.5 10*3/uL (ref 0.7–3.1)
Lymphs: 28 %
MCH: 25 pg — ABNORMAL LOW (ref 26.6–33.0)
MCHC: 32.1 g/dL (ref 31.5–35.7)
MCV: 78 fL — ABNORMAL LOW (ref 79–97)
Monocytes Absolute: 0.9 10*3/uL (ref 0.1–0.9)
Monocytes: 10 %
Neutrophils Absolute: 4.9 10*3/uL (ref 1.4–7.0)
Neutrophils: 56 %
Platelets: 377 10*3/uL (ref 150–450)
RBC: 5.37 x10E6/uL — ABNORMAL HIGH (ref 3.77–5.28)
RDW: 14.8 % (ref 11.7–15.4)
WBC: 8.7 10*3/uL (ref 3.4–10.8)

## 2022-11-24 LAB — IGG, IGA, IGM
IgA/Immunoglobulin A, Serum: 214 mg/dL (ref 87–352)
IgG (Immunoglobin G), Serum: 633 mg/dL (ref 586–1602)
IgM (Immunoglobulin M), Srm: <5 mg/dL — ABNORMAL LOW (ref 26–217)

## 2022-11-25 ENCOUNTER — Encounter: Payer: Self-pay | Admitting: *Deleted

## 2022-11-25 NOTE — Telephone Encounter (Signed)
LVM for pt about lab results per Dr. Ledon Snare note (ok per [facility]). Asked pt to call back if any questions. Also told her I sent mychart message about results.

## 2022-12-05 ENCOUNTER — Other Ambulatory Visit: Payer: Self-pay

## 2022-12-05 ENCOUNTER — Ambulatory Visit
Admission: RE | Admit: 2022-12-05 | Discharge: 2022-12-05 | Disposition: A | Discharge status: Home / Self Care | Payer: 59 | Source: Ambulatory Visit | Attending: Internal Medicine | Admitting: Internal Medicine

## 2022-12-05 ENCOUNTER — Ambulatory Visit: Payer: 59

## 2022-12-05 VITALS — BP 121/75 | HR 80 | Temp 98.1°F | Resp 18

## 2022-12-05 DIAGNOSIS — R053 Chronic cough: Secondary | ICD-10-CM | POA: Diagnosis not present

## 2022-12-05 DIAGNOSIS — J4521 Mild intermittent asthma with (acute) exacerbation: Secondary | ICD-10-CM

## 2022-12-05 DIAGNOSIS — J189 Pneumonia, unspecified organism: Secondary | ICD-10-CM | POA: Diagnosis not present

## 2022-12-05 DIAGNOSIS — H6123 Impacted cerumen, bilateral: Secondary | ICD-10-CM | POA: Diagnosis not present

## 2022-12-05 MED ORDER — CARBAMIDE PEROXIDE 6.5 % OT SOLN
5.0000 [drp] | Freq: Once | OTIC | Status: AC
  Administered 2022-12-05: 5 [drp] via OTIC

## 2022-12-05 MED ORDER — CEFDINIR 300 MG PO CAPS: 300.0000 mg | ORAL_CAPSULE | Freq: Two times a day (BID) | ORAL | 0 refills | Status: AC

## 2022-12-05 MED ORDER — ALBUTEROL SULFATE HFA 108 (90 BASE) MCG/ACT IN AERS: 1.0000 | INHALATION_SPRAY | Freq: Four times a day (QID) | RESPIRATORY_TRACT | 0 refills | Status: DC | PRN

## 2022-12-05 MED ORDER — FLUTICASONE-SALMETEROL 115-21 MCG/ACT IN AERO: 2.0000 | INHALATION_SPRAY | Freq: Two times a day (BID) | RESPIRATORY_TRACT | 0 refills | Status: DC

## 2022-12-05 MED ORDER — PROMETHAZINE-DM 6.25-15 MG/5ML PO SYRP: 5.0000 mL | ORAL_SOLUTION | Freq: Four times a day (QID) | ORAL | 0 refills | Status: DC | PRN

## 2022-12-05 MED ORDER — AZITHROMYCIN 250 MG PO TABS: 250.0000 mg | ORAL_TABLET | Freq: Every day | ORAL | 0 refills | Status: DC

## 2022-12-05 NOTE — Discharge Instructions (Signed)
Start Zithromax and cefdinir as prescribed.  Albuterol inhaler has been refilled.  You may take Promethazine DM as needed for cough.  Presents medication make you drowsy.  Do not drink alcohol or drive while on this medication.  I have also refilled your Advair inhaler.  Lots of rest and fluids.  Please follow-up with pulmonology and ear nose and throat given your persistent symptoms.  Please follow-up with your PCP in 2 to 3 days for recheck.  Please go to the ER for any worsening symptoms.  I hope you feel better soon!

## 2022-12-05 NOTE — ED Triage Notes (Signed)
Pt reports chronic ear infections and has been treated with multiple rounds of antibiotics and still is having problems with her ears. She has MS and states the antibiotics are interfering with her treatment. She also has been living in an environment with mold and just moved 1-2 weeks ago. She has not seen ENT

## 2022-12-05 NOTE — ED Provider Notes (Signed)
[name]    CSN: [id number] Arrival date & time: 12/05/22  0853      History   Chief Complaint Chief Complaint  Patient presents with   Otalgia    HPI Jean Carlson is a 22 y.o. female presents for cough and ear pain x 6 months.  Patient reports residence that she was recently living in had significant mold.  She just moved out of this place 1 to 2 weeks ago.  She reports she has been having this chronic cough as well as ear infections since June.  States she is been treated anywhere from 5-7 times with antibiotics for ear infection without improvement.  Also endorses a productive cough since that time as well.  Denies history of asthma but does report "reactive airway disease".  States she does have allergies and has previously used Advair but states she was told it was discontinued.  She also has a history of MS.  She was seen in urgent care on October 4 for cough.  Chest x-ray showed left lower lobe pneumonia and she was started on doxycycline and cefpodoxime as well as albuterol and benzonatate.  She reports minimal improvement of her cough since treatment.  She has been taking Flonase, Mucinex for symptoms.  She also states that multiple rounds of antibiotics has been causing issues with her MS.  She has not seen ENT or pulmonology.  No other concerns at this time.   Otalgia Associated symptoms: cough     Past Medical History:  Diagnosis Date   ADHD    Multiple sclerosis (HCC)     Patient Active Problem List   Diagnosis Date Noted   Gait disturbance 04/04/2020   Attention deficit hyperactivity disorder (ADHD) 02/05/2020   High risk medication use 05/03/2019   Vitamin D deficiency 05/03/2019   Depression with anxiety 05/03/2019   Class 1 obesity due to excess calories without serious comorbidity with body mass index (BMI) of 31.0 to 31.9 in adult    Neurogenic bowel    Hypokalemia    Neurogenic bladder 04/18/2019   Demyelinating disorder (HCC) 04/12/2019    ADHD 04/12/2019   Multiple sclerosis (HCC) 04/10/2019   Tobacco abuse 04/10/2019    Past Surgical History:  Procedure Laterality Date   WISDOM TOOTH EXTRACTION     x4    OB History   No obstetric history on file.      Home Medications    Prior to Admission medications   Medication Sig Start Date End Date Taking? Authorizing Provider  acetaminophen (TYLENOL) 325 MG tablet Take 2 tablets (650 mg total) by mouth every 6 (six) hours as needed for mild pain (or Fever >/= 101). 04/24/19  Yes Angiulli, Mcarthur Rossetti, PA-C  albuterol (VENTOLIN HFA) 108 (90 Base) MCG/ACT inhaler Inhale 1-2 puffs into the lungs every 6 (six) hours as needed for wheezing or shortness of breath. 12/05/22  Yes Radford Pax, NP  azithromycin (ZITHROMAX) 250 MG tablet Take 1 tablet (250 mg total) by mouth daily. Take first 2 tablets together, then 1 every day until finished. 12/05/22  Yes Radford Pax, NP  cefdinir (OMNICEF) 300 MG capsule Take 1 capsule (300 mg total) by mouth 2 (two) times daily for 10 days. 12/05/22 12/15/22 Yes Radford Pax, NP  cholecalciferol (VITAMIN D3) 25 MCG (1000 UNIT) tablet Take 5,000 Units by mouth daily.   Yes [provider]  etonogestrel-ethinyl estradiol (NUVARING) 0.12-0.015 MG/24HR vaginal ring  01/24/20  Yes [provider]  FLUoxetine (PROZAC) 40 MG capsule Take 40 mg by mouth daily. 07/17/22  Yes [provider]  fluticasone-salmeterol (ADVAIR HFA) 115-21 MCG/ACT inhaler Inhale 2 puffs into the lungs 2 (two) times daily. 12/05/22  Yes Radford Pax, NP  methylphenidate 36 MG PO CR tablet Take 36 mg by mouth every morning. 07/17/22  Yes [provider]  ocrelizumab (OCREVUS) 300 MG/10ML injection Inject into the vein once.   Yes [provider]  promethazine-dextromethorphan (PROMETHAZINE-DM) 6.25-15 MG/5ML syrup Take 5 mLs by mouth 4 (four) times daily as needed for cough. 12/05/22  Yes Radford Pax, NP  solifenacin (VESICARE) 10 MG  tablet Take 0.5 tablets (5 mg total) by mouth in the morning and at bedtime. 02/23/22  Yes Sater, Pearletha Furl, MD  benzonatate (TESSALON) 100 MG capsule Take 1 capsule (100 mg total) by mouth every 8 (eight) hours as needed for cough. Patient not taking: Reported on 11/23/2022 10/30/22   Gustavus Bryant, FNP  bisacodyl (DULCOLAX) 5 MG EC tablet Take 2 tablets (10 mg total) by mouth daily after supper. Patient taking differently: Take 10 mg by mouth daily as needed. 04/24/19   Angiulli, Mcarthur Rossetti, PA-C    Family History Family History  Problem Relation Age of Onset   Hyperlipidemia Maternal Grandmother    Hypertension Maternal Grandmother    Diabetes Paternal Grandfather    Hypertension Paternal Grandfather    Heart failure Paternal Grandfather     Social History Social History   Tobacco Use   Smoking status: Never   Smokeless tobacco: Never  Vaping Use   Vaping status: Every Day   Substances: Nicotine, Flavoring  Substance Use Topics   Alcohol use: Not Currently   Drug use: Never     Allergies   Amoxicillin, Codeine, and Lisdexamfetamine   Review of Systems Review of Systems  HENT:  Positive for ear pain.   Respiratory:  Positive for cough.      Physical Exam Triage Vital Signs ED Triage Vitals  Encounter Vitals Group     BP 12/05/22 0920 121/75     Systolic BP Percentile --      Diastolic BP Percentile --      Pulse Rate 12/05/22 0920 80     Resp 12/05/22 0920 18     Temp 12/05/22 0920 98.1 F (36.7 C)     Temp Source 12/05/22 0920 Oral     SpO2 12/05/22 0920 97 %     Weight --      Height --      Head Circumference --      Peak Flow --      Pain Score 12/05/22 0913 0     Pain Loc --      Pain Education --      Exclude from Growth Chart --    No data found.  Updated Vital Signs BP 121/75 (BP Location: Left Arm)   Pulse 80   Temp 98.1 F (36.7 C) (Oral)   Resp 18   LMP  (LMP Unknown)   SpO2 97%   Visual Acuity Right Eye Distance:   Left Eye  Distance:   Bilateral Distance:    Right Eye Near:   Left Eye Near:    Bilateral Near:     Physical Exam Vitals and nursing note reviewed.  Constitutional:      General: She is not in acute distress.    Appearance: She is well-developed. She is not ill-appearing.  HENT:  Head: Normocephalic and atraumatic.     Right Ear: Tympanic membrane and ear canal normal. There is impacted cerumen.     Left Ear: Tympanic membrane and ear canal normal. There is impacted cerumen.     Ears:     Comments: Bilateral canals and TMs within normal limits after irrigation    Nose: No congestion or rhinorrhea.     Mouth/Throat:     Mouth: Mucous membranes are moist.     Pharynx: Oropharynx is clear. Uvula midline. No oropharyngeal exudate or posterior oropharyngeal erythema.     Tonsils: No tonsillar exudate or tonsillar abscesses.  Eyes:     Conjunctiva/sclera: Conjunctivae normal.     Pupils: Pupils are equal, round, and reactive to light.  Cardiovascular:     Rate and Rhythm: Normal rate and regular rhythm.     Heart sounds: Normal heart sounds.  Pulmonary:     Effort: Pulmonary effort is normal. No respiratory distress.     Breath sounds: Normal breath sounds. No stridor. No wheezing, rhonchi or rales.  Musculoskeletal:     Cervical back: Normal range of motion and neck supple.  Lymphadenopathy:     Cervical: No cervical adenopathy.  Skin:    General: Skin is warm and dry.  Neurological:     General: No focal deficit present.     Mental Status: She is alert and oriented to person, place, and time.  Psychiatric:        Mood and Affect: Mood normal.        Behavior: Behavior normal.      UC Treatments / Results  Labs (all labs ordered are listed, but only abnormal results are displayed) Labs Reviewed - No data to display  EKG   Radiology DG Chest 2 View  Result Date: 12/05/2022 CLINICAL DATA:  Cough for 6 months.  Pneumonia in October. EXAM: CHEST - 2 VIEW COMPARISON:   10/30/2022 FINDINGS: Midline trachea. Normal heart size. Atherosclerosis in the transverse aorta. No pleural effusion or pneumothorax. Resolved lingular airspace disease. Improved left lower lobe airspace disease. New right lung base airspace disease obscuring the right hemidiaphragm on the frontal radiograph. IMPRESSION: Improved left lower and resolved lingular pneumonia. New right lower lobe airspace disease, likely pneumonia. Aortic Atherosclerosis (ICD10-I70.0). This is markedly age advanced. Electronically Signed   By: Jeronimo Greaves M.D.   On: 12/05/2022 10:30    Procedures Procedures (including critical care time)  Medications Ordered in UC Medications  carbamide peroxide (DEBROX) 6.5 % OTIC (EAR) solution 5 drop (5 drops Left EAR Given 12/05/22 1037)    Initial Impression / Assessment and Plan / UC Course  I have reviewed the triage vital signs and the nursing notes.  Pertinent labs & imaging results that were available during my care of the patient were reviewed by me and considered in my medical decision making (see chart for details).     Reviewed exam and symptoms with patient.  Patient with reported chronic cough for 6 months after mold exposure.  Recent treatment in October for pneumonia.  Chest x-ray shows resolving left lower no pneumonia but now new right lower lobe pneumonia.  Unclear correlation to mold exposure versus secondary to her MS medications.  Will start Zithromax and cefdinir.  Avoid fluoroquinolones given her MS meds and prolonged QT potential.  Refilled albuterol inhaler and Advair.  Promethazine DM as needed for cough, side effect profile reviewed.  Advise she follow-up with pulmonology ASAP given her repeat infections.  Also advised to  follow-up with ENT given her recurrent ear infections as well.  Discussed close follow-up with PCP in the next 2 to 3 days.  Strict ER precautions reviewed and patient verbalized understanding. Final Clinical Impressions(s) / UC  Diagnoses   Final diagnoses:  Bilateral impacted cerumen  Chronic cough  Pneumonia of right lower lobe due to infectious organism  Mild intermittent reactive airway disease with acute exacerbation     Discharge Instructions      Start Zithromax and cefdinir as prescribed.  Albuterol inhaler has been refilled.  You may take Promethazine DM as needed for cough.  Presents medication make you drowsy.  Do not drink alcohol or drive while on this medication.  I have also refilled your Advair inhaler.  Lots of rest and fluids.  Please follow-up with pulmonology and ear nose and throat given your persistent symptoms.  Please follow-up with your PCP in 2 to 3 days for recheck.  Please go to the ER for any worsening symptoms.  I hope you feel better soon!     ED Prescriptions     Medication Sig Dispense Auth. Provider   azithromycin (ZITHROMAX) 250 MG tablet Take 1 tablet (250 mg total) by mouth daily. Take first 2 tablets together, then 1 every day until finished. 6 tablet Radford Pax, NP   cefdinir (OMNICEF) 300 MG capsule Take 1 capsule (300 mg total) by mouth 2 (two) times daily for 10 days. 20 capsule Radford Pax, NP   promethazine-dextromethorphan (PROMETHAZINE-DM) 6.25-15 MG/5ML syrup Take 5 mLs by mouth 4 (four) times daily as needed for cough. 118 mL Radford Pax, NP   albuterol (VENTOLIN HFA) 108 (90 Base) MCG/ACT inhaler Inhale 1-2 puffs into the lungs every 6 (six) hours as needed for wheezing or shortness of breath. 1 each Radford Pax, NP   fluticasone-salmeterol (ADVAIR HFA) 115-21 MCG/ACT inhaler Inhale 2 puffs into the lungs 2 (two) times daily. 1 each Radford Pax, NP      PDMP not reviewed this encounter.   Radford Pax, NP 12/05/22 1115

## 2023-02-12 ENCOUNTER — Encounter: Payer: Self-pay | Admitting: Neurology

## 2023-02-13 ENCOUNTER — Telehealth: Payer: 59 | Admitting: Nurse Practitioner

## 2023-02-13 DIAGNOSIS — H6993 Unspecified Eustachian tube disorder, bilateral: Secondary | ICD-10-CM

## 2023-02-13 MED ORDER — IPRATROPIUM BROMIDE 0.03 % NA SOLN: 2.0000 | Freq: Two times a day (BID) | NASAL | 0 refills | Status: DC

## 2023-02-13 NOTE — Progress Notes (Signed)

## 2023-02-13 NOTE — Progress Notes (Signed)
I have spent 5 minutes in review of e-visit questionnaire, review and updating patient chart, medical decision making and response to patient.  ° °Jerrell Mangel W Secilia Apps, NP ° °  °

## 2023-02-15 ENCOUNTER — Other Ambulatory Visit: Payer: Self-pay

## 2023-02-15 ENCOUNTER — Other Ambulatory Visit: Payer: Self-pay | Admitting: Neurology

## 2023-02-15 MED ORDER — FLUOXETINE HCL 40 MG PO CAPS: 40.0000 mg | ORAL_CAPSULE | Freq: Every day | ORAL | 3 refills | Status: DC

## 2023-02-15 MED ORDER — MIRABEGRON ER 50 MG PO TB24: 50.0000 mg | ORAL_TABLET | Freq: Every day | ORAL | 5 refills | Status: DC

## 2023-02-16 ENCOUNTER — Other Ambulatory Visit: Payer: Self-pay | Admitting: Neurology

## 2023-02-16 MED ORDER — METHYLPHENIDATE HCL ER (OSM) 36 MG PO TBCR: 36.0000 mg | EXTENDED_RELEASE_TABLET | Freq: Every day | ORAL | 0 refills | Status: DC

## 2023-02-19 DIAGNOSIS — B974 Respiratory syncytial virus as the cause of diseases classified elsewhere: Secondary | ICD-10-CM | POA: Diagnosis not present

## 2023-02-19 DIAGNOSIS — Z8701 Personal history of pneumonia (recurrent): Secondary | ICD-10-CM | POA: Diagnosis not present

## 2023-02-19 DIAGNOSIS — F17210 Nicotine dependence, cigarettes, uncomplicated: Secondary | ICD-10-CM | POA: Diagnosis not present

## 2023-02-19 DIAGNOSIS — R051 Acute cough: Secondary | ICD-10-CM | POA: Diagnosis not present

## 2023-03-01 DIAGNOSIS — N911 Secondary amenorrhea: Secondary | ICD-10-CM | POA: Diagnosis not present

## 2023-03-04 DIAGNOSIS — N911 Secondary amenorrhea: Secondary | ICD-10-CM | POA: Diagnosis not present

## 2023-03-05 ENCOUNTER — Other Ambulatory Visit (HOSPITAL_COMMUNITY): Payer: Self-pay

## 2023-03-05 ENCOUNTER — Telehealth: Payer: Self-pay

## 2023-03-05 NOTE — Telephone Encounter (Signed)
*  [facility]  Pharmacy Patient Advocate Encounter   Received notification from Fax that prior authorization for Mirabegron  ER 50MG  er tablets  is required/requested.   Insurance verification completed.   The patient is insured through [employer] .   Per test claim: PA required; PA submitted to above mentioned insurance via CoverMyMeds Key/confirmation #/EOC [id number] Status is pending

## 2023-03-10 NOTE — Telephone Encounter (Signed)
Pharmacy Patient Advocate Encounter  Received notification from [employer] [facility] that Prior Authorization for Mirabegron ER 50 mg has been DENIED.  Full denial letter will be uploaded to the media tab. See denial reason below.   PA #/Case ID/Reference #:

## 2023-03-10 NOTE — Telephone Encounter (Signed)
Dr. Epimenio Foot- looks like you sent this in for her on 02/15/23. There are several qualifications for coverage. Can you review? You may need to update last OV note if you feel she does meet criteria

## 2023-03-11 ENCOUNTER — Ambulatory Visit (INDEPENDENT_AMBULATORY_CARE_PROVIDER_SITE_OTHER): Payer: 59

## 2023-03-11 ENCOUNTER — Encounter (HOSPITAL_COMMUNITY): Payer: Self-pay | Admitting: Emergency Medicine

## 2023-03-11 ENCOUNTER — Ambulatory Visit (INDEPENDENT_AMBULATORY_CARE_PROVIDER_SITE_OTHER): Payer: 59 | Admitting: Neurology

## 2023-03-11 ENCOUNTER — Inpatient Hospital Stay (HOSPITAL_COMMUNITY)
Admission: EM | Admit: 2023-03-11 | Discharge: 2023-03-14 | DRG: 831 | Disposition: A | Discharge status: Home / Self Care | Payer: 59 | Attending: Internal Medicine | Admitting: Internal Medicine

## 2023-03-11 ENCOUNTER — Encounter (HOSPITAL_COMMUNITY): Payer: Self-pay | Admitting: Internal Medicine

## 2023-03-11 ENCOUNTER — Encounter: Payer: Self-pay | Admitting: Neurology

## 2023-03-11 ENCOUNTER — Ambulatory Visit (INDEPENDENT_AMBULATORY_CARE_PROVIDER_SITE_OTHER)
Admission: EM | Admit: 2023-03-11 | Discharge: 2023-03-11 | Disposition: A | Discharge status: Other Acute Inpatient Hospital | Payer: 59 | Source: Home / Self Care

## 2023-03-11 ENCOUNTER — Other Ambulatory Visit: Payer: Self-pay

## 2023-03-11 VITALS — BP 137/84 | HR 106 | Ht 65.0 in

## 2023-03-11 DIAGNOSIS — O99342 Other mental disorders complicating pregnancy, second trimester: Secondary | ICD-10-CM | POA: Diagnosis not present

## 2023-03-11 DIAGNOSIS — J9601 Acute respiratory failure with hypoxia: Secondary | ICD-10-CM | POA: Diagnosis present

## 2023-03-11 DIAGNOSIS — O99512 Diseases of the respiratory system complicating pregnancy, second trimester: Principal | ICD-10-CM | POA: Diagnosis present

## 2023-03-11 DIAGNOSIS — Z1152 Encounter for screening for COVID-19: Secondary | ICD-10-CM

## 2023-03-11 DIAGNOSIS — Z79899 Other long term (current) drug therapy: Secondary | ICD-10-CM

## 2023-03-11 DIAGNOSIS — R269 Unspecified abnormalities of gait and mobility: Secondary | ICD-10-CM

## 2023-03-11 DIAGNOSIS — F17290 Nicotine dependence, other tobacco product, uncomplicated: Secondary | ICD-10-CM | POA: Diagnosis present

## 2023-03-11 DIAGNOSIS — A419 Sepsis, unspecified organism: Secondary | ICD-10-CM | POA: Insufficient documentation

## 2023-03-11 DIAGNOSIS — J159 Unspecified bacterial pneumonia: Secondary | ICD-10-CM | POA: Diagnosis present

## 2023-03-11 DIAGNOSIS — G35 Multiple sclerosis: Secondary | ICD-10-CM

## 2023-03-11 DIAGNOSIS — Z77120 Contact with and (suspected) exposure to mold (toxic): Secondary | ICD-10-CM | POA: Diagnosis not present

## 2023-03-11 DIAGNOSIS — R899 Unspecified abnormal finding in specimens from other organs, systems and tissues: Secondary | ICD-10-CM | POA: Diagnosis not present

## 2023-03-11 DIAGNOSIS — R0602 Shortness of breath: Secondary | ICD-10-CM

## 2023-03-11 DIAGNOSIS — R6520 Severe sepsis without septic shock: Secondary | ICD-10-CM | POA: Insufficient documentation

## 2023-03-11 DIAGNOSIS — F419 Anxiety disorder, unspecified: Secondary | ICD-10-CM | POA: Diagnosis present

## 2023-03-11 DIAGNOSIS — J189 Pneumonia, unspecified organism: Secondary | ICD-10-CM

## 2023-03-11 DIAGNOSIS — Z8701 Personal history of pneumonia (recurrent): Secondary | ICD-10-CM | POA: Diagnosis not present

## 2023-03-11 DIAGNOSIS — Z7960 Long term (current) use of unspecified immunomodulators and immunosuppressants: Secondary | ICD-10-CM

## 2023-03-11 DIAGNOSIS — O99352 Diseases of the nervous system complicating pregnancy, second trimester: Secondary | ICD-10-CM | POA: Diagnosis not present

## 2023-03-11 DIAGNOSIS — Z8249 Family history of ischemic heart disease and other diseases of the circulatory system: Secondary | ICD-10-CM

## 2023-03-11 DIAGNOSIS — O99332 Smoking (tobacco) complicating pregnancy, second trimester: Secondary | ICD-10-CM | POA: Diagnosis not present

## 2023-03-11 DIAGNOSIS — Z83438 Family history of other disorder of lipoprotein metabolism and other lipidemia: Secondary | ICD-10-CM | POA: Diagnosis not present

## 2023-03-11 DIAGNOSIS — Z3A19 19 weeks gestation of pregnancy: Secondary | ICD-10-CM

## 2023-03-11 DIAGNOSIS — N319 Neuromuscular dysfunction of bladder, unspecified: Secondary | ICD-10-CM

## 2023-03-11 DIAGNOSIS — Z833 Family history of diabetes mellitus: Secondary | ICD-10-CM

## 2023-03-11 DIAGNOSIS — R3915 Urgency of urination: Secondary | ICD-10-CM

## 2023-03-11 DIAGNOSIS — D84821 Immunodeficiency due to drugs: Secondary | ICD-10-CM | POA: Diagnosis not present

## 2023-03-11 LAB — RESPIRATORY PANEL BY PCR

## 2023-03-11 LAB — CBC WITH DIFFERENTIAL/PLATELET
Abs Immature Granulocytes: 0.31 10*3/uL — ABNORMAL HIGH (ref 0.00–0.07)
Basophils Absolute: 0 10*3/uL (ref 0.0–0.1)
Basophils Relative: 0 %
Eosinophils Absolute: 0 10*3/uL (ref 0.0–0.5)
Eosinophils Relative: 0 %
HCT: 34 % — ABNORMAL LOW (ref 36.0–46.0)
Hemoglobin: 11.5 g/dL — ABNORMAL LOW (ref 12.0–15.0)
Immature Granulocytes: 3 %
Lymphocytes Relative: 14 %
Lymphs Abs: 1.3 10*3/uL (ref 0.7–4.0)
MCH: 26.5 pg (ref 26.0–34.0)
MCHC: 33.8 g/dL (ref 30.0–36.0)
MCV: 78.3 fL — ABNORMAL LOW (ref 80.0–100.0)
Monocytes Absolute: 0.2 10*3/uL (ref 0.1–1.0)
Monocytes Relative: 3 %
Neutro Abs: 7.3 10*3/uL (ref 1.7–7.7)
Neutrophils Relative %: 80 %
Platelets: 344 10*3/uL (ref 150–400)
RBC: 4.34 MIL/uL (ref 3.87–5.11)
RDW: 14.6 % (ref 11.5–15.5)
WBC: 9.2 10*3/uL (ref 4.0–10.5)
nRBC: 0 % (ref 0.0–0.2)

## 2023-03-11 LAB — COMPREHENSIVE METABOLIC PANEL
ALT: 20 U/L (ref 0–44)
AST: 24 U/L (ref 15–41)
Albumin: 2.6 g/dL — ABNORMAL LOW (ref 3.5–5.0)
Alkaline Phosphatase: 143 U/L — ABNORMAL HIGH (ref 38–126)
Anion gap: 11 (ref 5–15)
BUN: 9 mg/dL (ref 6–20)
CO2: 19 mmol/L — ABNORMAL LOW (ref 22–32)
Calcium: 8.9 mg/dL (ref 8.9–10.3)
Chloride: 105 mmol/L (ref 98–111)
Creatinine, Ser: 0.55 mg/dL (ref 0.44–1.00)
GFR, Estimated: >60 mL/min (ref >60)
Glucose, Bld: 74 mg/dL (ref 70–99)
Potassium: 4 mmol/L (ref 3.5–5.1)
Sodium: 135 mmol/L (ref 135–145)
Total Bilirubin: 0.3 mg/dL (ref 0.0–1.2)
Total Protein: 6.3 g/dL — ABNORMAL LOW (ref 6.5–8.1)

## 2023-03-11 LAB — I-STAT CG4 LACTIC ACID, ED: Lactic Acid, Venous: 1.3 mmol/L (ref 0.5–1.9)

## 2023-03-11 LAB — RESP PANEL BY RT-PCR (RSV, FLU A&B, COVID)  RVPGX2
Influenza A by PCR: NEGATIVE
Influenza B by PCR: NEGATIVE
Resp Syncytial Virus by PCR: NEGATIVE
SARS Coronavirus 2 by RT PCR: NEGATIVE

## 2023-03-11 LAB — HCG, QUANTITATIVE, PREGNANCY: hCG, Beta Chain, Quant, S: 7830 m[IU]/mL — ABNORMAL HIGH (ref <5)

## 2023-03-11 LAB — STREP PNEUMONIAE URINARY ANTIGEN: Strep Pneumo Urinary Antigen: NEGATIVE

## 2023-03-11 MED ORDER — ACETAMINOPHEN 500 MG PO TABS
1000.0000 mg | ORAL_TABLET | Freq: Once | ORAL | Status: AC
  Administered 2023-03-11: 1000 mg via ORAL
  Filled 2023-03-11: qty 2

## 2023-03-11 MED ORDER — SODIUM CHLORIDE 0.9 % IV SOLN
1.0000 g | INTRAVENOUS | Status: DC
  Administered 2023-03-11: 1 g via INTRAVENOUS
  Filled 2023-03-11: qty 10

## 2023-03-11 MED ORDER — IPRATROPIUM BROMIDE 0.03 % NA SOLN
2.0000 | Freq: Two times a day (BID) | NASAL | Status: DC
  Filled 2023-03-11: qty 30

## 2023-03-11 MED ORDER — ACETAMINOPHEN 650 MG RE SUPP: 650.0000 mg | Freq: Four times a day (QID) | RECTAL | Status: DC | PRN

## 2023-03-11 MED ORDER — SODIUM CHLORIDE 0.9 % IV SOLN
1.0000 g | Freq: Once | INTRAVENOUS | Status: AC
  Administered 2023-03-11: 1 g via INTRAVENOUS
  Filled 2023-03-11: qty 10

## 2023-03-11 MED ORDER — AZITHROMYCIN 500 MG PO TABS
500.0000 mg | ORAL_TABLET | Freq: Every day | ORAL | Status: AC
  Administered 2023-03-11 – 2023-03-13 (×3): 500 mg via ORAL
  Filled 2023-03-11: qty 1
  Filled 2023-03-11: qty 2
  Filled 2023-03-11: qty 1

## 2023-03-11 MED ORDER — SODIUM CHLORIDE 0.9 % IV BOLUS
1000.0000 mL | Freq: Once | INTRAVENOUS | Status: AC
  Administered 2023-03-11: 1000 mL via INTRAVENOUS

## 2023-03-11 MED ORDER — IPRATROPIUM BROMIDE 0.06 % NA SOLN
2.0000 | Freq: Two times a day (BID) | NASAL | Status: DC
  Administered 2023-03-11 – 2023-03-14 (×6): 2 via NASAL
  Filled 2023-03-11: qty 15

## 2023-03-11 MED ORDER — ALBUTEROL SULFATE HFA 108 (90 BASE) MCG/ACT IN AERS
1.0000 | INHALATION_SPRAY | Freq: Four times a day (QID) | RESPIRATORY_TRACT | Status: DC | PRN
Start: 2023-03-11 — End: 2023-03-11

## 2023-03-11 MED ORDER — ONDANSETRON HCL 4 MG PO TABS: 4.0000 mg | ORAL_TABLET | Freq: Four times a day (QID) | ORAL | Status: DC | PRN

## 2023-03-11 MED ORDER — METHYLPREDNISOLONE SODIUM SUCC 125 MG IJ SOLR
60.0000 mg | Freq: Once | INTRAMUSCULAR | Status: AC
Start: 2023-03-11 — End: 2023-03-11
  Administered 2023-03-11: 60 mg via INTRAMUSCULAR

## 2023-03-11 MED ORDER — ONDANSETRON HCL 4 MG/2ML IJ SOLN: 4.0000 mg | Freq: Four times a day (QID) | INTRAMUSCULAR | Status: DC | PRN

## 2023-03-11 MED ORDER — GUAIFENESIN 100 MG/5ML PO LIQD
10.0000 mL | Freq: Four times a day (QID) | ORAL | Status: DC | PRN
  Administered 2023-03-11 – 2023-03-13 (×3): 10 mL via ORAL
  Filled 2023-03-11: qty 15
  Filled 2023-03-11: qty 10
  Filled 2023-03-11: qty 15

## 2023-03-11 MED ORDER — METHYLPREDNISOLONE SODIUM SUCC 125 MG IJ SOLR
INTRAMUSCULAR | Status: AC
  Filled 2023-03-11: qty 2

## 2023-03-11 MED ORDER — ACETAMINOPHEN 325 MG PO TABS
650.0000 mg | ORAL_TABLET | Freq: Four times a day (QID) | ORAL | Status: DC | PRN
  Administered 2023-03-11 – 2023-03-12 (×2): 650 mg via ORAL
  Filled 2023-03-11 (×2): qty 2

## 2023-03-11 MED ORDER — IPRATROPIUM-ALBUTEROL 0.5-2.5 (3) MG/3ML IN SOLN
3.0000 mL | Freq: Once | RESPIRATORY_TRACT | Status: AC
  Administered 2023-03-11: 3 mL via RESPIRATORY_TRACT

## 2023-03-11 MED ORDER — SODIUM CHLORIDE 0.9 % IV SOLN
2.0000 g | INTRAVENOUS | Status: DC
  Administered 2023-03-12 – 2023-03-14 (×3): 2 g via INTRAVENOUS
  Filled 2023-03-11 (×3): qty 20

## 2023-03-11 MED ORDER — GUAIFENESIN ER 600 MG PO TB12
600.0000 mg | ORAL_TABLET | Freq: Two times a day (BID) | ORAL | Status: DC
  Administered 2023-03-11 – 2023-03-14 (×6): 600 mg via ORAL
  Filled 2023-03-11 (×6): qty 1

## 2023-03-11 MED ORDER — IPRATROPIUM-ALBUTEROL 0.5-2.5 (3) MG/3ML IN SOLN
RESPIRATORY_TRACT | Status: AC
  Filled 2023-03-11: qty 3

## 2023-03-11 MED ORDER — ALBUTEROL SULFATE (2.5 MG/3ML) 0.083% IN NEBU
2.5000 mg | INHALATION_SOLUTION | Freq: Four times a day (QID) | RESPIRATORY_TRACT | Status: DC | PRN
  Administered 2023-03-11: 2.5 mg via RESPIRATORY_TRACT
  Filled 2023-03-11: qty 3

## 2023-03-11 NOTE — ED Notes (Signed)
[facility] arrived for transport

## 2023-03-11 NOTE — ED Notes (Signed)
Answered phone during intake process.  Able to talk to nurse and caller

## 2023-03-11 NOTE — ED Notes (Signed)
Report called to ED CN

## 2023-03-11 NOTE — Care Plan (Signed)
I went to see patient in the emergency room.  Her grandmother was at the bedside.  Patient was tearful and continues to be emotional.  She does have evidence of pneumonia but she has other priorities and needs to go to [place] tomorrow.  Patient is not willing to stay in the hospital this time.  I have updated ER physician so that she can be discharged from the emergency room as patient is not willing to stay in the hospital.  [facility] will admit patient only.  Patient is willing to stay in the hospital.  Will sign off from the admission list.  Please reconsult if patient is willing to stay.

## 2023-03-11 NOTE — H&P (Signed)
History and Physical    Jean Carlson:[id number] DOB: 2000/07/11 DOA: 03/11/2023  PCP: Milus Height, PA  Patient coming from: Home via urgent care  I have personally briefly reviewed patient's old medical records available.   Chief Complaint: Shortness of breath, cough  HPI: Jean Carlson is a 23 y.o. female with medical history significant of MS on disease modifying agent comes to the emergency room with 3 months of progressive shortness of breath, 1 rounds of antibiotics used, 2 weeks of worsening cough, productive yellow sputum.  No reported fever.  Tired of coughing.  Shortness of breath is mostly with activities.  She also had some bilateral lower chest wall pain due to coughing. Patient also is [redacted] weeks pregnant and she found out that she is pregnant about 2 weeks ago. Patient is on infusion of Ocrevus and she is due for that today.  She went to neurologist office and they noticed that she is obviously short of breath so they sent her to the emergency room. Patient reports exposure to mold and wonders whether this is some kind of mold infection.  ED Course: On room air in the emergency room.  Heart rate 123.  Lactic acid normal.  CBC and CMP pending.  Chest x-ray with multifocal pneumonia more on the right side. Endorses anxiety, poor appetite.  Blood cultures were drawn.  Patient was started on Rocephin and azithromycin in the emergency room.  On initial interview, patient did not want to stay in the hospital but subsequently agreed to stay in the hospital.  She was given a dose of azithromycin 500 mg and Rocephin 1 g in the ER.  Will give additional 1 g to treat pneumonia.  Review of Systems: all systems are reviewed and pertinent positive as per HPI otherwise rest are negative.    Past Medical History:  Diagnosis Date   ADHD    Multiple sclerosis (HCC)     Past Surgical History:  Procedure Laterality Date   WISDOM TOOTH EXTRACTION     x4    Social history    reports that she has never smoked. She has never used smokeless tobacco. She reports current alcohol use. She reports that she does not use drugs.  Allergies  Allergen Reactions   Amoxicillin Other (See Comments)    CAUSED BAD NIGHTMARES  Other Reaction(s): bad dreams   Codeine Nausea Only    SEVERE NAUSEA  Other Reaction(s): stomach upset   Lisdexamfetamine Other (See Comments)    "Caused depression to the point of self-harm"  Other Reaction(s): chest pain    Family History  Problem Relation Age of Onset   Hyperlipidemia Maternal Grandmother    Hypertension Maternal Grandmother    Diabetes Paternal Grandfather    Hypertension Paternal Grandfather    Heart failure Paternal Grandfather      Prior to Admission medications   Medication Sig Start Date End Date Taking? Authorizing Provider  acetaminophen (TYLENOL) 325 MG tablet Take 2 tablets (650 mg total) by mouth every 6 (six) hours as needed for mild pain (or Fever >/= 101). 04/24/19   Angiulli, Mcarthur Rossetti, PA-C  albuterol (VENTOLIN HFA) 108 (90 Base) MCG/ACT inhaler Inhale 1-2 puffs into the lungs every 6 (six) hours as needed for wheezing or shortness of breath. 12/05/22   Radford Pax, NP  bisacodyl (DULCOLAX) 5 MG EC tablet Take 2 tablets (10 mg total) by mouth daily after supper. 04/24/19   Angiulli, Mcarthur Rossetti, PA-C  chlorhexidine (PERIDEX) 0.12 %  solution Use as directed 15 mLs in the mouth or throat 2 (two) times daily. 02/03/23   [provider]  cholecalciferol (VITAMIN D3) 25 MCG (1000 UNIT) tablet Take 5,000 Units by mouth daily.    [provider]  clindamycin (CLEOCIN) 300 MG capsule Take 300 mg by mouth every 6 (six) hours. Patient not taking: Reported on 03/11/2023 02/03/23   [provider]  etonogestrel-ethinyl estradiol (NUVARING) 0.12-0.015 MG/24HR vaginal ring  01/24/20   [provider]  FLUoxetine (PROZAC) 40 MG capsule Take 1 capsule (40 mg total) by mouth daily. 02/15/23   Sater,  Pearletha Furl, MD  ibuprofen (ADVIL) 800 MG tablet Take 800 mg by mouth every 6 (six) hours as needed. 02/03/23   [provider]  ipratropium (ATROVENT) 0.03 % nasal spray Place 2 sprays into both nostrils every 12 (twelve) hours. 02/13/23   Claiborne Rigg, NP  methylphenidate (CONCERTA) 36 MG PO CR tablet Take 1 tablet (36 mg total) by mouth daily. 02/16/23   Sater, Pearletha Furl, MD  methylphenidate 36 MG PO CR tablet Take 36 mg by mouth every morning. Patient not taking: Reported on 03/11/2023 07/17/22   [provider]  mirabegron ER (MYRBETRIQ) 50 MG TB24 tablet Take 1 tablet (50 mg total) by mouth daily. 02/15/23   Sater, Pearletha Furl, MD  ocrelizumab (OCREVUS) 300 MG/10ML injection Inject into the vein once.    [provider]  solifenacin (VESICARE) 10 MG tablet Take 0.5 tablets (5 mg total) by mouth in the morning and at bedtime. Patient not taking: Reported on 03/11/2023 02/23/22   Asa Lente, MD  solifenacin (VESICARE) 10 MG tablet Take 10 mg by mouth daily. Patient not taking: Reported on 03/11/2023    [provider]    Physical Exam: Vitals:   03/11/23 1307 03/11/23 1424 03/11/23 1425  BP: (!) 146/96  (!) 116/54  Pulse: (!) 123 (!) 112 (!) 118  Resp: 18  (!) 28  Temp: 98.7 F (37.1 C)  98 F (36.7 C)  TempSrc: Oral  Oral  SpO2: 93% 98% 95%    Constitutional: NAD, anxious.  Tearful.  On room air. Vitals:   03/11/23 1307 03/11/23 1424 03/11/23 1425  BP: (!) 146/96  (!) 116/54  Pulse: (!) 123 (!) 112 (!) 118  Resp: 18  (!) 28  Temp: 98.7 F (37.1 C)  98 F (36.7 C)  TempSrc: Oral  Oral  SpO2: 93% 98% 95%   Eyes: PERRL, lids and conjunctivae normal ENMT: Mucous membranes are moist. Posterior pharynx clear of any exudate or lesions.Normal dentition.  Neck: normal, supple, no masses, no thyromegaly Respiratory: clear to auscultation bilaterally, no wheezing, no crackles. Normal respiratory effort. No accessory muscle use.  Occasional  tachypneic with anxiety. Cardiovascular: Regular rate and rhythm, tachycardic.  No murmurs / rubs / gallops. No extremity edema. 2+ pedal pulses. No carotid bruits.  Abdomen: no tenderness, no masses palpated. No hepatosplenomegaly. Bowel sounds positive.  Pelvic exam not done. Musculoskeletal: no clubbing / cyanosis. No joint deformity upper and lower extremities. Good ROM, no contractures. Normal muscle tone.  Skin: no rashes, lesions, ulcers. No induration Neurologic: CN 2-12 grossly intact. Sensation intact, DTR normal. Strength 5/5 in all 4.  Psychiatric: Normal judgment and insight. Alert and oriented x 3.  Anxious and emotional.    Labs on Admission: I have personally reviewed following labs and imaging studies  CBC: No results for input(s): "WBC", "NEUTROABS", "HGB", "HCT", "MCV", "PLT" in the last 168 hours.  Basic Metabolic Panel: No results for input(s): "NA", "K", "CL", "CO2", "GLUCOSE", "BUN", "CREATININE", "CALCIUM", "MG", "PHOS" in the last 168 hours. GFR: CrCl cannot be calculated (Patient's most recent lab result is older than the maximum 21 days allowed.). Liver Function Tests: No results for input(s): "AST", "ALT", "ALKPHOS", "BILITOT", "PROT", "ALBUMIN" in the last 168 hours. No results for input(s): "LIPASE", "AMYLASE" in the last 168 hours. No results for input(s): "AMMONIA" in the last 168 hours. Coagulation Profile: No results for input(s): "INR", "PROTIME" in the last 168 hours. Cardiac Enzymes: No results for input(s): "CKTOTAL", "CKMB", "CKMBINDEX", "TROPONINI" in the last 168 hours. BNP (last 3 results) No results for input(s): "PROBNP" in the last 8760 hours. HbA1C: No results for input(s): "HGBA1C" in the last 72 hours. CBG: No results for input(s): "GLUCAP" in the last 168 hours. Lipid Profile: No results for input(s): "CHOL", "HDL", "LDLCALC", "TRIG", "CHOLHDL", "LDLDIRECT" in the last 72 hours. Thyroid Function Tests: No results for input(s): "TSH",  "T4TOTAL", "FREET4", "T3FREE", "THYROIDAB" in the last 72 hours. Anemia Panel: No results for input(s): "VITAMINB12", "FOLATE", "FERRITIN", "TIBC", "IRON", "RETICCTPCT" in the last 72 hours. Urine analysis:    Component Value Date/Time   COLORURINE YELLOW 04/09/2019 2054   APPEARANCEUR HAZY (A) 04/09/2019 2054   LABSPEC 1.013 04/09/2019 2054   PHURINE 7.0 04/09/2019 2054   GLUCOSEU NEGATIVE 04/09/2019 2054   HGBUR NEGATIVE 04/09/2019 2054   BILIRUBINUR NEGATIVE 04/09/2019 2054   KETONESUR 5 (A) 04/09/2019 2054   PROTEINUR NEGATIVE 04/09/2019 2054   NITRITE NEGATIVE 04/09/2019 2054   LEUKOCYTESUR LARGE (A) 04/09/2019 2054    Radiological Exams on Admission: DG Chest 2 View Result Date: 03/11/2023 CLINICAL DATA:  Cough and shortness of breath for the past 3 weeks. EXAM: CHEST - 2 VIEW COMPARISON:  Chest x-ray dated December 05, 2022. FINDINGS: The heart size and mediastinal contours are within normal limits. Normal pulmonary vascularity. New patchy consolidation in both upper lobes in the right lower lobe. No pleural effusion or pneumothorax. No acute osseous abnormality. IMPRESSION: 1. New multifocal pneumonia. Given recurrent infection since October, consider pulmonology consultation. Electronically Signed   By: Obie Dredge M.D.   On: 03/11/2023 10:51     Assessment/Plan Principal Problem:   Pneumonia     1.  Multifocal pneumonia in a patient with MS and on immunomodulators: Immunocompromised. Unfortunately cannot have CT scan due to being pregnant. Agree with admission given severity of symptoms. Antibiotics to treat bacterial pneumonia.  Will start patient on Rocephin 2 g daily and azithromycin 500 mg daily. Chest physiotherapy, incentive spirometry, deep breathing exercises, sputum induction, mucolytic's and bronchodilators. Sputum cultures, blood cultures, Legionella and streptococcal antigen. Supplemental oxygen to keep saturations more than 90%. Check HIV, Fungitell  20. Mobilize.  2.  Pregnancy, 19 weeks: Currently stable.  She will have outpatient follow-up.  3.  MS on ocrelizumab: Currently on hold.  No neurological symptoms today.   DVT prophylaxis: SCDs Code Status: Full code Family Communication: Patient's grandmother at the bedside Disposition Plan: Home when stable Consults called: None Admission status: Inpatient.  IV antibiotics.   Dorcas Carrow MD [facility]

## 2023-03-11 NOTE — Telephone Encounter (Signed)
Pt added to MD scheduled today at 830am for visit

## 2023-03-11 NOTE — ED Triage Notes (Signed)
Patient BIB [facility] from [facility] for pneumonia confirmed by CXR, patient has been treated x2 for same with no resolution. Has hx of MS and pregnancy. HR 120s with [facility], A&Ox4, dyspnea on exertion, tearful in triage.

## 2023-03-11 NOTE — Telephone Encounter (Signed)
I saw this patient again today.  I have documented urinary issues more completely in the visit note from today and she now meets the criteria for Myrbetriq

## 2023-03-11 NOTE — ED Notes (Signed)
Report to [facility] for transport.

## 2023-03-11 NOTE — ED Provider Triage Note (Signed)
Emergency Medicine Provider Triage Evaluation Note  Jean Carlson , a 23 y.o. female  was evaluated in triage.  Pt complains of shortness of breath and cough..  Review of Systems  Positive: Shortness of breath Negative: Weakness.  Physical Exam  BP (!) 146/96 (BP Location: Left Arm)   Pulse (!) 123   Temp 98.7 F (37.1 C) (Oral)   Resp 18   LMP 09/07/2022   SpO2 93%  Patient shortness of breath and cough.  Recent pneumonias.  Tachycardia Medical Decision Making  Medically screening exam initiated at 1:29 PM.  Appropriate orders placed.  Jean Carlson was informed that the remainder of the evaluation will be completed by another provider, this initial triage assessment does not replace that evaluation, and the importance of remaining in the ED until their evaluation is complete.  Patient with shortness of breath.  Tachycardia.  No recurrent pneumonia on x-ray.  Has been on immunosuppression with Ocrevus and has had previous steroids for her MS.  Has been on multiple antibiotics.  Discussed with pharmacist will help order antibiotics.  Will require admission to the hospital.  Patient of note is also pregnant.  Positive test at home but was hoping to not tell her neurologist so she could continue on her medicine.   Jean Core, MD 03/11/23 1330

## 2023-03-11 NOTE — Progress Notes (Signed)
[facility]  PATIENT: Jean Carlson DOB: 12/11/2000  REFERRING DOCTOR OR PCP:  Milus Height, PA SOURCE: Patient, notes from [facility], imaging and lab reports, MRI images personally reviewed.  _________________________________   HISTORICAL  CHIEF COMPLAINT:  Chief Complaint  Patient presents with   Room 11    Pt is here Alone. Pt states that she has an sinus infection. Pt states that she was supposed to get her infusion but was told that she couldn't.      HISTORY OF PRESENT ILLNESS:  Jean Carlson is a 22 y.o.  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  Update  03/11/2023 She is on Ocrevus and tolerates it well.  Last infusion was 09/10/2022 and she was going to have an infusion today but she has cough, increased RR and phlegm production and I want to check for pneumonia before giving next dose --- also her IgM has been low (though IgG is normal).     She had lingular and LLL opacity c/w pneumonia last year.  Currently, she is reporting cough, sputum production.  I also note that she has tachypnea.  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  Therefore, I do not want her to get the Ocrevus today.  If the pneumonia is confirmed we may need to also consider a different disease modifying therapy  She feels generally weaker and notes much more fatigue since her coughing and other symptoms started..    She denies exacerbations since last visit but has had some days when she feels weaker     She has felt best when we do an additional day of IV Solumedrol around month 5.     11/23/2022 had low IgM <5 started out normal at 46).  The last month of each cycle, she has noted more issues wtih balance, tremors and diplopia.  She also has more fatigue.   Last cycle an extra dose of IV Solumedrol a couple weeks before her infusion helped her with the symptoms.  We discussed that  some patients reprot a "crap gap" but the effect of the medicne on her B cells persists the whole 6 months (last B cell count at 5+ months was 0).     Gait and balance are doing better though not baseline.   She will ise the banister if she needs to hold items with the other hand.    No falls.   Her left arm is slightly weaker, especially in the wrist.   She has some reduced keyboarding due to a mild left tremor.    Fortunately, she is right handed and writing is not affected.   She has mild diplopia.   Vision is doing well.  Diplopia resolved.  She has urinary urgency with occasional incontinence.  Vesicare has helped a little but but she still has episodes of urge incontinence.  The combination of Vesicare and Myrbetriq has helped her much more.  She has tried conservative/behavioral measures like fluid restriction, timed voids but these have not significantly helped her urge incontinence..   We prescribed Myrbetriq and she was given 30 days but needs pre-auth for additional refills.     Focus and attention are doing better and she is not using Concerta.   She has some emotional lability and sometimes feels rage.     Prozac had not helped.   She has rare crying spells .  No inappropriate laughter.  She usually needs 9-10 hours of sleep to feel refreshed.  She has insomnia.   She takes 5000 U daily Vit D.   She is physically active  MS History: In March 2021, she had the onset of slurred speech,left hand tremor and clumsiness, abnormal eye movements on the right and urinary incontinence.   She went to the ED.    She was admitted and received 5 days of IV Solu-Medrol.   While in the hospital she started to note improvement and she has continued to improve.    In retrospect, in February 2021, she had vertigo x 3 weeks and had several falls, one hyperextending her left knee.   She saw orthopedics.   Also in late 2020, she had a couple weeks of fluctuating numbness in her left hand.  MRI showed enhancing  lesions in the pons, medulla and spinal cord (and several other nonenhancing brainstem and spinal cord.   She was started on Ocrevus 05/2019.     MRIs of the brain and cervical spine dated 04/09/2019.  The MRI of the brain shows multiple T2/FLAIR hyperintense foci in the hemispheres, brainstem and cerebellum.  2 foci in the pons and medulla enhance after contrast.  There are also several foci in the hemispheres that enhanced.  There are several foci within the spinal cord.  These are located at C2, more to the right, C3, towards the right and centrally and C4-C5 centrally.  There is subtle enhancement of the C4C5 focus.    MRI of the brain 07/11/2020 showed multiple T2/FLAIR hyperintense foci in the medulla and pons.  Additional foci were noted in the periventricular and deep white matter.  None of the foci enhance.  Compared to the MRI from 04/09/2019, there were no new lesions and the many enhancing lesions on the previous scan no longer enhance and many are smaller in size.  MRI brian and cervical spine 09/17/2021 showed no new lesions.  She has muliple spinal lesions  There is no family history of MS or other autoimmune diseases.   REVIEW OF SYSTEMS: Constitutional: No fevers, chills, sweats, or change in appetite. Eyes: No visual changes, double vision, eye pain Ear, nose and throat: No hearing loss, ear pain, nasal congestion, sore throat Cardiovascular: No chest pain, palpitations Respiratory:  No shortness of breath at rest or with exertion.   No wheezes GastrointestinaI: No nausea, vomiting, diarrhea, abdominal pain, fecal incontinence Genitourinary:  No dysuria, urinary retention or frequency.  No incontinence or nocturia.   Musculoskeletal:  No neck pain, back pain Integumentary: No rash, pruritus, skin lesions Neurological: as above Psychiatric: As above Endocrine: No palpitations, diaphoresis, change in appetite, change in weigh or increased thirst Hematologic/Lymphatic:  No anemia,  purpura, petechiae. Allergic/Immunologic: No itchy/runny eyes, nasal congestion, recent allergic reactions, rashes  ALLERGIES: Allergies  Allergen Reactions   Amoxicillin Other (See Comments)    CAUSED BAD NIGHTMARES  Other Reaction(s): bad dreams   Codeine Nausea Only    SEVERE NAUSEA  Other Reaction(s): stomach upset   Lisdexamfetamine Other (See Comments)    "Caused depression to the point of self-harm"  Other Reaction(s): chest pain    HOME MEDICATIONS:  Current Outpatient Medications:    acetaminophen (TYLENOL) 325 MG tablet, Take 2 tablets (650 mg total) by mouth every 6 (six) hours as needed for mild pain (or Fever >/= 101)., Disp:  , Rfl:    albuterol (VENTOLIN HFA) 108 (90 Base) MCG/ACT inhaler, Inhale 1-2 puffs into the  lungs every 6 (six) hours as needed for wheezing or shortness of breath., Disp: 1 each, Rfl: 0   bisacodyl (DULCOLAX) 5 MG EC tablet, Take 2 tablets (10 mg total) by mouth daily after supper., Disp: 30 tablet, Rfl: 0   chlorhexidine (PERIDEX) 0.12 % solution, Use as directed 15 mLs in the mouth or throat 2 (two) times daily., Disp: , Rfl:    cholecalciferol (VITAMIN D3) 25 MCG (1000 UNIT) tablet, Take 5,000 Units by mouth daily., Disp: , Rfl:    FLUoxetine (PROZAC) 40 MG capsule, Take 1 capsule (40 mg total) by mouth daily., Disp: 90 capsule, Rfl: 3   ibuprofen (ADVIL) 800 MG tablet, Take 800 mg by mouth every 6 (six) hours as needed., Disp: , Rfl:    ipratropium (ATROVENT) 0.03 % nasal spray, Place 2 sprays into both nostrils every 12 (twelve) hours., Disp: 30 mL, Rfl: 0   methylphenidate (CONCERTA) 36 MG PO CR tablet, Take 1 tablet (36 mg total) by mouth daily., Disp: 30 tablet, Rfl: 0   mirabegron ER (MYRBETRIQ) 50 MG TB24 tablet, Take 1 tablet (50 mg total) by mouth daily., Disp: 30 tablet, Rfl: 5   ocrelizumab (OCREVUS) 300 MG/10ML injection, Inject into the vein once., Disp: , Rfl:    clindamycin (CLEOCIN) 300 MG capsule, Take 300 mg by mouth every 6  (six) hours. (Patient not taking: Reported on 03/11/2023), Disp: , Rfl:    etonogestrel-ethinyl estradiol (NUVARING) 0.12-0.015 MG/24HR vaginal ring, , Disp: , Rfl:    methylphenidate 36 MG PO CR tablet, Take 36 mg by mouth every morning. (Patient not taking: Reported on 03/11/2023), Disp: , Rfl:    solifenacin (VESICARE) 10 MG tablet, Take 0.5 tablets (5 mg total) by mouth in the morning and at bedtime. (Patient not taking: Reported on 03/11/2023), Disp: 90 tablet, Rfl: 3   solifenacin (VESICARE) 10 MG tablet, Take 10 mg by mouth daily. (Patient not taking: Reported on 03/11/2023), Disp: , Rfl:   PAST MEDICAL HISTORY: Past Medical History:  Diagnosis Date   ADHD    Multiple sclerosis (HCC)     PAST SURGICAL HISTORY: Past Surgical History:  Procedure Laterality Date   WISDOM TOOTH EXTRACTION     x4    FAMILY HISTORY: Family History  Problem Relation Age of Onset   Hyperlipidemia Maternal Grandmother    Hypertension Maternal Grandmother    Diabetes Paternal Grandfather    Hypertension Paternal Grandfather    Heart failure Paternal Grandfather     SOCIAL HISTORY:  Social History   Socioeconomic History   Marital status: Single    Spouse name: Not on file   Number of children: Not on file   Years of education: Not on file   Highest education level: Not on file  Occupational History    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  Tobacco Use   Smoking status: Never   Smokeless tobacco: Never  Vaping Use   Vaping status: Every Day   Substances: Nicotine, Flavoring  Substance and Sexual Activity   Alcohol use: Yes   Drug use: Never   Sexual activity: Yes  Other Topics Concern   Not on file  Social History Narrative   Right handed   Caffeine use: tea and soda daily   Social Drivers of Health   Financial Resource Strain: Not on file  Food Insecurity: Not on file  Transportation Needs: Not on file  Physical Activity: Not on file  Stress: Not on file  Social Connections:  Unknown (06/10/2021)   Received  from [facility]   Social Network    Social Network: Not on file  Intimate Partner Violence: Unknown (05/02/2021)   Received from [facility], [facility]   HITS    Physically Hurt: Not on file    Insult or Talk Down To: Not on file    Threaten Physical Harm: Not on file    Scream or Curse: Not on file     PHYSICAL EXAM  Vitals:   03/11/23 0849  BP: 137/84  Pulse: (!) 106  Height: 5\' 5"  (1.651 m)     Body mass index is 33.86 kg/m.  No results found.  General: The patient is well-developed and well-nourished and in no acute distress.  Heart had a regular rate and rhythm and she had mild tachycardia.  Respiratory rate was 24.  Reduced lung sounds on the left  HEENT:  Head is [place]/AT.    Skin: Extremities are without rash or  edema.   Neurologic Exam  Mental status: The patient is alert and oriented x 3 at the time of the examination. The patient has apparent normal recent and remote memory, with an apparently normal attention span and concentration ability.   Speech is normal.  Cranial nerves: Extraocular movements are full though she noted slight diplopia on far right gaze.[name]  No nystagmus.  There is good facial sensation to soft touch bilaterally.Facial strength is normal.  Trapezius and sternocleidomastoid strength is normal. No dysarthria is noted.  No obvious hearing deficits are noted.  Motor:  Muscle bulk is normal.  Very slight tremor on the left tone is normal. Strength is  5 / 5 in all 4 extremities.   Sensory: Sensory testing is intact to soft touch and vibration sensation in arms and mild reduced vibration in left leg.  Coordination: Finger-nose-finger is slightly reduced in the left arm versus the right arm.  Nutrition was normal  Gait and station: Station is normal.   Gait is mildly wide.  Tandem gait is wide.  Romberg is negative.   Reflexes: Deep tendon reflexes show spread at the knees and increased at the  ankles with nonsustained clonus.        DIAGNOSTIC DATA (LABS, IMAGING, TESTING) - I reviewed patient records, labs, notes, testing and imaging myself where available.  Lab Results  Component Value Date   WBC 8.7 11/23/2022   HGB 13.4 11/23/2022   HCT 41.8 11/23/2022   MCV 78 (L) 11/23/2022   PLT 377 11/23/2022      Component Value Date/Time   NA 139 04/24/2019 0607   K 3.8 04/24/2019 0607   CL 104 04/24/2019 0607   CO2 24 04/24/2019 0607   GLUCOSE 81 04/24/2019 0607   BUN 7 04/24/2019 0607   CREATININE 0.74 04/24/2019 0607   CALCIUM 9.0 04/24/2019 0607   PROT 6.0 (L) 04/18/2019 0511   ALBUMIN 3.5 04/18/2019 0511   AST 30 04/18/2019 0511   ALT 43 04/18/2019 0511   ALKPHOS 51 04/18/2019 0511   BILITOT 1.1 04/18/2019 0511   GFRNONAA >60 04/24/2019 0607   GFRAA >60 04/24/2019 0607    Lab Results  Component Value Date   TSH 2.107 04/09/2019       ASSESSMENT AND PLAN  Multiple sclerosis (HCC)  High risk medication use  Abnormal laboratory test  Neurogenic bladder  Urinary urgency  Gait disturbance  Pneumonia due to infectious organism, unspecified laterality, unspecified part of lung   1.   I am concerned that she has a  pneumonia given her symptoms.  I recommended that she go straight to either urgent care or the emergency room so that she can get a rapid chest x-ray and treatment if necessary.  Based on the results we may need to consider holding the Ocrevus to switch to a different medication.   IgM is very low -  Would consider Mavenclad or an S1P modulator.  She is JCV Ab positive so can't do Tysabri.  2.    Continue taking vitamin D.  Try to eat well and exercise as tolerated. 3.   Continue Vesicare for bladder issues.  Due to continued urge incontinence I will also start Myrbetriq. 4.    We will see her back in 6 months or sooner if there are new or worsening neurologic symptoms.   42-minute office visit with the majority of the time spent  face-to-face for history and physical, discussion/counseling and decision-making.  Additional time with record review and documentation.  Addendum: She went to the emergency room.  Chest x-ray showed a new multifocal pneumonia.    Ryer Asato A. Epimenio Foot, MD, [facility] 03/11/2023, 1:05 PM Certified in Neurology, Clinical Neurophysiology, Sleep Medicine and Neuroimaging  [facility] [address], [address] [place], [place] [address] [phone]

## 2023-03-11 NOTE — ED Provider Notes (Signed)
[facility]    CSN: [id number] Arrival date & time: 03/11/23  0940     History   Chief Complaint Chief Complaint  Patient presents with   Shortness of Breath    HPI Jean Carlson is a 23 y.o. female.  3 week history of increasing cough, shortness of breath, nasal congestion Cough is productive, causing chest pain Worsening in last week Denies fever  Actually went to neuro visit today who sent her here due to distress  Had pneumonia in October and November Treated with doxy and cefpodoxime, and then cefdinir and azith  She also reports end of January seeing another clinic who did an xray and told her she had pneumonia, but doesn't know what medicine they gave her. Not sure if it was an antibiotic. No record of this in patient chart.   Past Medical History:  Diagnosis Date   ADHD    Multiple sclerosis [facility])     Patient Active Problem List   Diagnosis Date Noted   Gait disturbance 04/04/2020   Attention deficit hyperactivity disorder (ADHD) 02/05/2020   High risk medication use 05/03/2019   Vitamin D deficiency 05/03/2019   Depression with anxiety 05/03/2019   Class 1 obesity due to excess calories without serious comorbidity with body mass index (BMI) of 31.0 to 31.9 in adult    Neurogenic bowel    Hypokalemia    Neurogenic bladder 04/18/2019   Demyelinating disorder (HCC) 04/12/2019   ADHD 04/12/2019   Multiple sclerosis (HCC) 04/10/2019   Tobacco abuse 04/10/2019    Past Surgical History:  Procedure Laterality Date   WISDOM TOOTH EXTRACTION     x4    OB History     Gravida  1   Para      Term      Preterm      AB      Living         SAB      IAB      Ectopic      Multiple      Live Births               Home Medications    Prior to Admission medications   Medication Sig Start Date End Date Taking? Authorizing Provider  acetaminophen (TYLENOL) 325 MG tablet Take 2 tablets (650 mg total) by mouth every 6 (six)  hours as needed for mild pain (or Fever >/= 101). 04/24/19   Angiulli, Mcarthur Rossetti, PA-C  albuterol (VENTOLIN HFA) 108 (90 Base) MCG/ACT inhaler Inhale 1-2 puffs into the lungs every 6 (six) hours as needed for wheezing or shortness of breath. 12/05/22   Radford Pax, NP  bisacodyl (DULCOLAX) 5 MG EC tablet Take 2 tablets (10 mg total) by mouth daily after supper. 04/24/19   Angiulli, Mcarthur Rossetti, PA-C  chlorhexidine (PERIDEX) 0.12 % solution Use as directed 15 mLs in the mouth or throat 2 (two) times daily. 02/03/23   [provider]  cholecalciferol (VITAMIN D3) 25 MCG (1000 UNIT) tablet Take 5,000 Units by mouth daily.    [provider]  clindamycin (CLEOCIN) 300 MG capsule Take 300 mg by mouth every 6 (six) hours. Patient not taking: Reported on 03/11/2023 02/03/23   [provider]  etonogestrel-ethinyl estradiol (NUVARING) 0.12-0.015 MG/24HR vaginal ring  01/24/20   [provider]  FLUoxetine (PROZAC) 40 MG capsule Take 1 capsule (40 mg total) by mouth daily. 02/15/23   Sater, Pearletha Furl, MD  ibuprofen (ADVIL)  800 MG tablet Take 800 mg by mouth every 6 (six) hours as needed. 02/03/23   [provider]  ipratropium (ATROVENT) 0.03 % nasal spray Place 2 sprays into both nostrils every 12 (twelve) hours. 02/13/23   Claiborne Rigg, NP  methylphenidate (CONCERTA) 36 MG PO CR tablet Take 1 tablet (36 mg total) by mouth daily. 02/16/23   Sater, Pearletha Furl, MD  methylphenidate 36 MG PO CR tablet Take 36 mg by mouth every morning. Patient not taking: Reported on 03/11/2023 07/17/22   [provider]  mirabegron ER (MYRBETRIQ) 50 MG TB24 tablet Take 1 tablet (50 mg total) by mouth daily. 02/15/23   Sater, Pearletha Furl, MD  ocrelizumab (OCREVUS) 300 MG/10ML injection Inject into the vein once.    [provider]  solifenacin (VESICARE) 10 MG tablet Take 0.5 tablets (5 mg total) by mouth in the morning and at bedtime. Patient not taking: Reported on 03/11/2023  02/23/22   Asa Lente, MD  solifenacin (VESICARE) 10 MG tablet Take 10 mg by mouth daily. Patient not taking: Reported on 03/11/2023    [provider]    Family History Family History  Problem Relation Age of Onset   Hyperlipidemia Maternal Grandmother    Hypertension Maternal Grandmother    Diabetes Paternal Grandfather    Hypertension Paternal Grandfather    Heart failure Paternal Grandfather     Social History Social History   Tobacco Use   Smoking status: Never   Smokeless tobacco: Never  Vaping Use   Vaping status: Every Day   Substances: Nicotine, Flavoring  Substance Use Topics   Alcohol use: Yes   Drug use: Never     Allergies   Amoxicillin, Codeine, and Lisdexamfetamine   Review of Systems Review of Systems Per HPI  Physical Exam Triage Vital Signs ED Triage Vitals  Encounter Vitals Group     BP 03/11/23 0951 128/76     Systolic BP Percentile --      Diastolic BP Percentile --      Pulse Rate 03/11/23 0951 (!) 121     Resp 03/11/23 0951 (!) 40     Temp 03/11/23 0951 97.7 F (36.5 C)     Temp Source 03/11/23 0951 Oral     SpO2 03/11/23 0951 94 %     Weight --      Height --      Head Circumference --      Peak Flow --      Pain Score 03/11/23 0946 2     Pain Loc --      Pain Education --      Exclude from Growth Chart --    No data found.  Updated Vital Signs BP 118/81   Pulse (!) 120   Temp 97.6 F (36.4 C)   Resp (!) 32   LMP 09/07/2022   SpO2 (!) 87%    Physical Exam Vitals and nursing note reviewed.  Constitutional:      General: She is in acute distress.     Appearance: She is not toxic-appearing.  HENT:     Nose: Congestion present.     Mouth/Throat:     Mouth: Mucous membranes are moist.     Pharynx: Oropharynx is clear.  Eyes:     Conjunctiva/sclera: Conjunctivae normal.  Cardiovascular:     Rate and Rhythm: Regular rhythm. Tachycardia present.     Heart sounds: Normal heart sounds.  Pulmonary:      Effort: Tachypnea and  respiratory distress present.     Comments: Coarse sounds RLL Musculoskeletal:     Cervical back: Normal range of motion.  Skin:    General: Skin is warm and dry.  Neurological:     Mental Status: She is oriented to person, place, and time.     UC Treatments / Results  Labs (all labs ordered are listed, but only abnormal results are displayed) Labs Reviewed - No data to display  EKG   Radiology DG Chest 2 View Result Date: 03/11/2023 CLINICAL DATA:  Cough and shortness of breath for the past 3 weeks. EXAM: CHEST - 2 VIEW COMPARISON:  Chest x-ray dated December 05, 2022. FINDINGS: The heart size and mediastinal contours are within normal limits. Normal pulmonary vascularity. New patchy consolidation in both upper lobes in the right lower lobe. No pleural effusion or pneumothorax. No acute osseous abnormality. IMPRESSION: 1. New multifocal pneumonia. Given recurrent infection since October, consider pulmonology consultation. Electronically Signed   By: Obie Dredge M.D.   On: 03/11/2023 10:51    Procedures Procedures (including critical care time)  Medications Ordered in UC Medications  ipratropium-albuterol (DUONEB) 0.5-2.5 (3) MG/3ML nebulizer solution 3 mL (3 mLs Nebulization Given 03/11/23 1117)  methylPREDNISolone sodium succinate (SOLU-MEDROL) 125 mg/2 mL injection 60 mg (60 mg Intramuscular Given 03/11/23 1120)    Initial Impression / Assessment and Plan / UC Course  I have reviewed the triage vital signs and the nursing notes.  Pertinent labs & imaging results that were available during my care of the patient were reviewed by me and considered in my medical decision making (see chart for details).  Afebrile, tachycardic, tachypneic Increased work of breathing. Sating 94% room air Placed on 4L [place] DuoNeb given with IM solumedrol  Chest xray multifocal pneumonia.  Pulmonology referral placed  Removing nasal canula patient drops to 87% and HR  130s I have advised evaluation in the emergency department.  She meets sepsis criteria and requires higher level of care Keeping on oxygen. [facility] called for transport to ED   Patient reported LMP as August 2024, when asked by this provider any possibility of pregnancy, she states "I was hoping you wouldn't ask me that". Reports she is currently pregnant, discovered on last week on 2/6 She reports going to women's clinic tomorrow morning. She is adamant we not put current pregnancy in her chart, because she doesn't want her neurologist to see it and stop prescribing her medication.   Final Clinical Impressions(s) / UC Diagnoses   Final diagnoses:  Shortness of breath  Multifocal pneumonia  Sepsis with acute hypoxic respiratory failure, due to unspecified organism, unspecified whether septic shock present [facility])   Discharge Instructions   None    ED Prescriptions   None    PDMP not reviewed this encounter.   Climmie Cronce, Ray Church 03/11/23 1302

## 2023-03-11 NOTE — ED Triage Notes (Signed)
States neurologist sent patient to ucc with concerns for pneumonia.    Cough, sob and chest congestion for 3 months.  Symptoms have worsened in the last week.  States she has MS  Reports she has had antibiotics over the past few months for pneumonia-not working

## 2023-03-11 NOTE — ED Provider Notes (Signed)
[place] EMERGENCY DEPARTMENT AT [facility] Provider Note   CSN: [id number] Arrival date & time: 03/11/23  1303     History  Chief Complaint  Patient presents with   Shortness of Breath   HPI Jean Carlson is a 23 y.o. female with history of MS and tobacco use presenting for concern for pneumonia and shortness of breath.  States she has been coughing and endorses subjective fever at home.  Cough is productive with white and yellow sputum.  Symptoms have been going on for about 2 weeks now.  States she is short of breath with exertion at times.  Also endorses some chest pain around the lower left rib and extends around to left mid back.  States if she coughs it is a sharp burning pain but now it is more of a dull pain.  States she was seen for chronic cough in November of this past year.  Was found to have a right lower lobe pneumonia at that time.  Was started on a course of azithromycin and cefdinir at that time.  Patient states that she does smoke and mostly vape at times.   Shortness of Breath      Home Medications Prior to Admission medications   Medication Sig Start Date End Date Taking? Authorizing Provider  acetaminophen (TYLENOL) 325 MG tablet Take 2 tablets (650 mg total) by mouth every 6 (six) hours as needed for mild pain (or Fever >/= 101). 04/24/19   Angiulli, Mcarthur Rossetti, PA-C  albuterol (VENTOLIN HFA) 108 (90 Base) MCG/ACT inhaler Inhale 1-2 puffs into the lungs every 6 (six) hours as needed for wheezing or shortness of breath. 12/05/22   Radford Pax, NP  bisacodyl (DULCOLAX) 5 MG EC tablet Take 2 tablets (10 mg total) by mouth daily after supper. 04/24/19   Angiulli, Mcarthur Rossetti, PA-C  chlorhexidine (PERIDEX) 0.12 % solution Use as directed 15 mLs in the mouth or throat 2 (two) times daily. 02/03/23   [provider]  cholecalciferol (VITAMIN D3) 25 MCG (1000 UNIT) tablet Take 5,000 Units by mouth daily.    [provider]  clindamycin (CLEOCIN)  300 MG capsule Take 300 mg by mouth every 6 (six) hours. Patient not taking: Reported on 03/11/2023 02/03/23   [provider]  etonogestrel-ethinyl estradiol (NUVARING) 0.12-0.015 MG/24HR vaginal ring  01/24/20   [provider]  FLUoxetine (PROZAC) 40 MG capsule Take 1 capsule (40 mg total) by mouth daily. 02/15/23   Sater, Pearletha Furl, MD  ibuprofen (ADVIL) 800 MG tablet Take 800 mg by mouth every 6 (six) hours as needed. 02/03/23   [provider]  ipratropium (ATROVENT) 0.03 % nasal spray Place 2 sprays into both nostrils every 12 (twelve) hours. 02/13/23   Claiborne Rigg, NP  methylphenidate (CONCERTA) 36 MG PO CR tablet Take 1 tablet (36 mg total) by mouth daily. 02/16/23   Sater, Pearletha Furl, MD  methylphenidate 36 MG PO CR tablet Take 36 mg by mouth every morning. Patient not taking: Reported on 03/11/2023 07/17/22   [provider]  mirabegron ER (MYRBETRIQ) 50 MG TB24 tablet Take 1 tablet (50 mg total) by mouth daily. 02/15/23   Sater, Pearletha Furl, MD  ocrelizumab (OCREVUS) 300 MG/10ML injection Inject into the vein once.    [provider]  solifenacin (VESICARE) 10 MG tablet Take 0.5 tablets (5 mg total) by mouth in the morning and at bedtime. Patient not taking: Reported on 03/11/2023 02/23/22   Epimenio Foot, Pearletha Furl,  MD  solifenacin (VESICARE) 10 MG tablet Take 10 mg by mouth daily. Patient not taking: Reported on 03/11/2023    [provider]      Allergies    Amoxicillin, Codeine, and Lisdexamfetamine    Review of Systems   Review of Systems  Respiratory:  Positive for shortness of breath.     Physical Exam Updated Vital Signs BP (!) 116/54   Pulse (!) 118   Temp 98 F (36.7 C) (Oral)   Resp (!) 28   LMP 09/07/2022   SpO2 95%  Physical Exam Vitals and nursing note reviewed.  HENT:     Head: Normocephalic and atraumatic.     Mouth/Throat:     Mouth: Mucous membranes are moist.  Eyes:     General:        Right eye: No discharge.         Left eye: No discharge.     Conjunctiva/sclera: Conjunctivae normal.  Cardiovascular:     Rate and Rhythm: Regular rhythm. Tachycardia present.     Pulses: Normal pulses.     Heart sounds: Normal heart sounds.  Pulmonary:     Effort: Pulmonary effort is normal.     Breath sounds: No decreased breath sounds, wheezing, rhonchi or rales.  Abdominal:     General: Abdomen is flat.     Palpations: Abdomen is soft.  Skin:    General: Skin is warm and dry.     Coloration: Skin is pale.  Neurological:     General: No focal deficit present.  Psychiatric:        Mood and Affect: Mood normal.     ED Results / Procedures / Treatments   Labs (all labs ordered are listed, but only abnormal results are displayed) Labs Reviewed  CULTURE, BLOOD (ROUTINE X 2)  CULTURE, BLOOD (ROUTINE X 2)  EXPECTORATED SPUTUM ASSESSMENT W GRAM STAIN, RFLX TO RESP C  RESP PANEL BY RT-PCR (RSV, FLU A&B, COVID)  RVPGX2  RESPIRATORY PANEL BY PCR  COMPREHENSIVE METABOLIC PANEL  CBC WITH DIFFERENTIAL/PLATELET  HCG, QUANTITATIVE, PREGNANCY  HIV ANTIBODY (ROUTINE TESTING W REFLEX)  LEGIONELLA PNEUMOPHILA SEROGP 1 UR AG  STREP PNEUMONIAE URINARY ANTIGEN  FUNGITELL BETA-D-GLUCAN  I-STAT CG4 LACTIC ACID, ED  I-STAT CG4 LACTIC ACID, ED    EKG None  Radiology DG Chest 2 View Result Date: 03/11/2023 CLINICAL DATA:  Cough and shortness of breath for the past 3 weeks. EXAM: CHEST - 2 VIEW COMPARISON:  Chest x-ray dated December 05, 2022. FINDINGS: The heart size and mediastinal contours are within normal limits. Normal pulmonary vascularity. New patchy consolidation in both upper lobes in the right lower lobe. No pleural effusion or pneumothorax. No acute osseous abnormality. IMPRESSION: 1. New multifocal pneumonia. Given recurrent infection since October, consider pulmonology consultation. Electronically Signed   By: Obie Dredge M.D.   On: 03/11/2023 10:51    Procedures .Critical Care  Performed by:  Gareth Eagle, PA-C Authorized by: Gareth Eagle, PA-C   Critical care provider statement:    Critical care time (minutes):  30   Critical care was necessary to treat or prevent imminent or life-threatening deterioration of the following conditions:  Sepsis   Critical care was time spent personally by me on the following activities:  Development of treatment plan with patient or surrogate, discussions with consultants, evaluation of patient's response to treatment, examination of patient, ordering and review of laboratory studies, ordering and review of radiographic studies, ordering and performing treatments and  interventions, pulse oximetry, re-evaluation of patient's condition and review of old charts     Medications Ordered in ED Medications  cefTRIAXone (ROCEPHIN) 1 g in sodium chloride 0.9 % 100 mL IVPB (1 g Intravenous New Bag/Given 03/11/23 1449)  azithromycin (ZITHROMAX) tablet 500 mg (500 mg Oral Given 03/11/23 1434)  sodium chloride 0.9 % bolus 1,000 mL (1,000 mLs Intravenous New Bag/Given 03/11/23 1439)  acetaminophen (TYLENOL) tablet 1,000 mg (1,000 mg Oral Given 03/11/23 1433)    ED Course/ Medical Decision Making/ A&P                                 Medical Decision Making Risk OTC drugs. Decision regarding hospitalization.   Initial Impression and Ddx 23 year old well-appearing female presenting for cough and shortness of breath and chest pain.  Initially tachypneic and tachycardic otherwise exam was unremarkable.  DDx includes pneumonia, sepsis, PE, COPD exacerbation, heart failure exacerbation, other. Patient PMH that increases complexity of ED encounter: history of MS and tobacco use  Interpretation of Diagnostics - Labs pending  -Chest x-ray from outside facility concerning for multifocal pneumonia.  Patient Reassessment and Ultimate Disposition/Management Reviewed her chart, patient was evaluated for pneumonia in November of this past year, started on  azithromycin and cefdinir.  Started her on IV ceftriaxone and azithromycin.  Admitted her to hospital service for pneumonia and concern for associated sepsis with Dr. Jerral Ralph. Remains well at this time no acute distress and hemodynamically stable.  Patient management required discussion with the following services or consulting groups:  Hospitalist Service  Complexity of Problems Addressed Acute complicated illness or Injury  Additional Data Reviewed and Analyzed Further history obtained from: Past medical history and medications listed in the EMR and Prior ED visit notes  Patient Encounter Risk Assessment Consideration of hospitalization         Final Clinical Impression(s) / ED Diagnoses Final diagnoses:  Pneumonia due to infectious organism, unspecified laterality, unspecified part of lung    Rx / DC Orders ED Discharge Orders     None         Gareth Eagle, PA-C 03/11/23 1530    Lonell Grandchild, MD 03/12/23 [phone]

## 2023-03-11 NOTE — Progress Notes (Signed)
Patient transferred from ED via stretcher at 1840 pm. Alert and oriented. On RA. Skin is intact. Connected to cardiac monitor box 04. Skin is intact. Will continue to monitor

## 2023-03-12 DIAGNOSIS — J189 Pneumonia, unspecified organism: Secondary | ICD-10-CM | POA: Diagnosis not present

## 2023-03-12 LAB — CBC
HCT: 37.8 % (ref 36.0–46.0)
Hemoglobin: 12.6 g/dL (ref 12.0–15.0)
MCH: 26.6 pg (ref 26.0–34.0)
MCHC: 33.3 g/dL (ref 30.0–36.0)
MCV: 79.9 fL — ABNORMAL LOW (ref 80.0–100.0)
Platelets: 347 10*3/uL (ref 150–400)
RBC: 4.73 MIL/uL (ref 3.87–5.11)
RDW: 14.6 % (ref 11.5–15.5)
WBC: 12.6 10*3/uL — ABNORMAL HIGH (ref 4.0–10.5)
nRBC: 0 % (ref 0.0–0.2)

## 2023-03-12 LAB — BASIC METABOLIC PANEL
Anion gap: 10 (ref 5–15)
BUN: 7 mg/dL (ref 6–20)
CO2: 21 mmol/L — ABNORMAL LOW (ref 22–32)
Calcium: 8.9 mg/dL (ref 8.9–10.3)
Chloride: 103 mmol/L (ref 98–111)
Creatinine, Ser: 0.5 mg/dL (ref 0.44–1.00)
GFR, Estimated: >60 mL/min (ref >60)
Glucose, Bld: 78 mg/dL (ref 70–99)
Potassium: 3.4 mmol/L — ABNORMAL LOW (ref 3.5–5.1)
Sodium: 134 mmol/L — ABNORMAL LOW (ref 135–145)

## 2023-03-12 LAB — PHOSPHORUS: Phosphorus: 3.1 mg/dL (ref 2.5–4.6)

## 2023-03-12 LAB — MAGNESIUM: Magnesium: 2.1 mg/dL (ref 1.7–2.4)

## 2023-03-12 NOTE — Progress Notes (Signed)
PROGRESS NOTE    Jean Carlson  ZOX:[id number] DOB: [birth year] DOA: 03/11/2023 PCP: Milus Height, PA  Outpatient Specialists:     Brief Narrative:  Patient is a 23 year old female with history of ADHD and multiple sclerosis, on immunomodulators.  Patient was admitted with multifocal pneumonia.  Patient was also found to be [redacted] weeks pregnant.  Unfortunately, patient's current medications may be incompatible with pregnancy.  Will defer to the OB/GYN team and the neurology team.  Patient is quite upset, crying.  Patient is currently on azithromycin and IV Rocephin.  There is history of possible mold exposure.  Results of Fungitell beta D glucan is still pending.   Assessment & Plan:   Principal Problem:   Pneumonia   Multifocal pneumonia: -History of MS on immunomodulators. -Patient will be considered immunocompromised. -Complete course of antibiotics. -Supportive care. -Follow results of Fungitell.  Pregnancy: -Patient is [redacted] weeks pregnant. -Patient has MS on immunomodulators. -Will defer to the neurology team and the OB/GYN team.  Multiple sclerosis: -On immunomodulators. -Stable.   DVT prophylaxis: SCD. Code Status: Full code. Family Communication: Grandmother. Disposition Plan: To depend on hospital course.   Consultants:  None.  Procedures:  None.  Antimicrobials:  IV Rocephin. Azithromycin   Subjective: No new complaints.  Objective: Vitals:   03/11/23 2056 03/12/23 0011 03/12/23 0429 03/12/23 0811  BP:  113/67 104/68 124/74  Pulse:  (!) 103 96 93  Resp:  18 18 18   Temp:  97.6 F (36.4 C) 97.9 F (36.6 C) 98 F (36.7 C)  TempSrc:  Oral Oral Oral  SpO2:  98% 97% 96%  Weight: 99.1 kg     Height: 5\' 5"  (1.651 m)       Intake/Output Summary (Last 24 hours) at 03/12/2023 1329 Last data filed at 03/12/2023 1042 Gross per 24 hour  Intake 594 ml  Output --  Net 594 ml   Filed Weights   03/11/23 2056  Weight: 99.1 kg     Examination:  General exam: Appears calm and comfortable  Respiratory system: Clear to auscultation.  Cardiovascular system: S1 & S2 heard Gastrointestinal system: Abdomen is soft and nontender.   Central nervous system: Awake and alert.  Patient moves all extremities.   Extremities: No leg edema.  Data Reviewed: I have personally reviewed following labs and imaging studies  CBC: Recent Labs  Lab 03/11/23 2112 03/12/23 0820  WBC 9.2 12.6*  NEUTROABS 7.3  --   HGB 11.5* 12.6  HCT 34.0* 37.8  MCV 78.3* 79.9*  PLT 344 347   Basic Metabolic Panel: Recent Labs  Lab 03/11/23 1436 03/12/23 0820  NA 135 134*  K 4.0 3.4*  CL 105 103  CO2 19* 21*  GLUCOSE 74 78  BUN 9 7  CREATININE 0.55 0.50  CALCIUM 8.9 8.9  MG  --  2.1  PHOS  --  3.1   GFR: Estimated Creatinine Clearance: 126.3 mL/min (by C-G formula based on SCr of 0.5 mg/dL). Liver Function Tests: Recent Labs  Lab 03/11/23 1436  AST 24  ALT 20  ALKPHOS 143*  BILITOT 0.3  PROT 6.3*  ALBUMIN 2.6*   No results for input(s): "LIPASE", "AMYLASE" in the last 168 hours. No results for input(s): "AMMONIA" in the last 168 hours. Coagulation Profile: No results for input(s): "INR", "PROTIME" in the last 168 hours. Cardiac Enzymes: No results for input(s): "CKTOTAL", "CKMB", "CKMBINDEX", "TROPONINI" in the last 168 hours. BNP (last 3 results) No results for input(s): "PROBNP" in the last 8760  hours. HbA1C: No results for input(s): "HGBA1C" in the last 72 hours. CBG: No results for input(s): "GLUCAP" in the last 168 hours. Lipid Profile: No results for input(s): "CHOL", "HDL", "LDLCALC", "TRIG", "CHOLHDL", "LDLDIRECT" in the last 72 hours. Thyroid Function Tests: No results for input(s): "TSH", "T4TOTAL", "FREET4", "T3FREE", "THYROIDAB" in the last 72 hours. Anemia Panel: No results for input(s): "VITAMINB12", "FOLATE", "FERRITIN", "TIBC", "IRON", "RETICCTPCT" in the last 72 hours. Urine analysis:     Component Value Date/Time   COLORURINE YELLOW 04/09/2019 2054   APPEARANCEUR HAZY (A) 04/09/2019 2054   LABSPEC 1.013 04/09/2019 2054   PHURINE 7.0 04/09/2019 2054   GLUCOSEU NEGATIVE 04/09/2019 2054   HGBUR NEGATIVE 04/09/2019 2054   BILIRUBINUR NEGATIVE 04/09/2019 2054   KETONESUR 5 (A) 04/09/2019 2054   PROTEINUR NEGATIVE 04/09/2019 2054   NITRITE NEGATIVE 04/09/2019 2054   LEUKOCYTESUR LARGE (A) 04/09/2019 2054   Sepsis Labs: @LABRCNTIP (procalcitonin:4,lacticidven:4)  ) Recent Results (from the past 240 hours)  Culture, blood (routine x 2)     Status: None (Preliminary result)   Collection Time: 03/11/23  2:36 PM   Specimen: BLOOD  Result Value Ref Range Status   Specimen Description BLOOD SITE NOT SPECIFIED  Final   Special Requests   Final    BOTTLES DRAWN AEROBIC AND ANAEROBIC Blood Culture results may not be optimal due to an inadequate volume of blood received in culture bottles   Culture   Final    NO GROWTH < 24 HOURS Performed at [facility], [address] [place], [place] [address]    Report Status PENDING  Incomplete  Resp panel by RT-PCR (RSV, Flu A&B, Covid) Anterior Nasal Swab     Status: None   Collection Time: 03/11/23  5:35 PM   Specimen: Anterior Nasal Swab  Result Value Ref Range Status   SARS Coronavirus 2 by RT PCR NEGATIVE NEGATIVE Final   Influenza A by PCR NEGATIVE NEGATIVE Final   Influenza B by PCR NEGATIVE NEGATIVE Final    Comment: (NOTE) The Xpert Xpress SARS-CoV-2/FLU/RSV plus assay is intended as an aid in the diagnosis of influenza from Nasopharyngeal swab specimens and should not be used as a sole basis for treatment. Nasal washings and aspirates are unacceptable for Xpert Xpress SARS-CoV-2/FLU/RSV testing.  Fact Sheet for Patients: BloggerCourse.com  Fact Sheet for Healthcare Providers: SeriousBroker.it  This test is not yet approved or cleared by the [place] FDA  and has been authorized for detection and/or diagnosis of SARS-CoV-2 by FDA under an Emergency Use Authorization (EUA). This EUA will remain in effect (meaning this test can be used) for the duration of the COVID-19 declaration under Section 564(b)(1) of the Act, 21 U.S.C. section 360bbb-3(b)(1), unless the authorization is terminated or revoked.     Resp Syncytial Virus by PCR NEGATIVE NEGATIVE Final    Comment: (NOTE) Fact Sheet for Patients: BloggerCourse.com  Fact Sheet for Healthcare Providers: SeriousBroker.it  This test is not yet approved or cleared by the [place] FDA and has been authorized for detection and/or diagnosis of SARS-CoV-2 by FDA under an Emergency Use Authorization (EUA). This EUA will remain in effect (meaning this test can be used) for the duration of the COVID-19 declaration under Section 564(b)(1) of the Act, 21 U.S.C. section 360bbb-3(b)(1), unless the authorization is terminated or revoked.  Performed at [facility], [address], [place], [place] [address]   Respiratory (~20 pathogens) panel by PCR     Status: None   Collection  Time: 03/11/23  5:35 PM   Specimen: Nasopharyngeal Swab; Respiratory  Result Value Ref Range Status   Adenovirus NOT DETECTED NOT DETECTED Final   Coronavirus 229E NOT DETECTED NOT DETECTED Final    Comment: (NOTE) The Coronavirus on the Respiratory Panel, DOES NOT test for the novel  Coronavirus (2019 nCoV)    Coronavirus HKU1 NOT DETECTED NOT DETECTED Final   Coronavirus NL63 NOT DETECTED NOT DETECTED Final   Coronavirus OC43 NOT DETECTED NOT DETECTED Final   Metapneumovirus NOT DETECTED NOT DETECTED Final   Rhinovirus / Enterovirus NOT DETECTED NOT DETECTED Final   Influenza A NOT DETECTED NOT DETECTED Final   Influenza B NOT DETECTED NOT DETECTED Final   Parainfluenza Virus 1 NOT DETECTED NOT DETECTED Final   Parainfluenza Virus 2 NOT DETECTED NOT  DETECTED Final   Parainfluenza Virus 3 NOT DETECTED NOT DETECTED Final   Parainfluenza Virus 4 NOT DETECTED NOT DETECTED Final   Respiratory Syncytial Virus NOT DETECTED NOT DETECTED Final   Bordetella pertussis NOT DETECTED NOT DETECTED Final   Bordetella Parapertussis NOT DETECTED NOT DETECTED Final   Chlamydophila pneumoniae NOT DETECTED NOT DETECTED Final   Mycoplasma pneumoniae NOT DETECTED NOT DETECTED Final    Comment: Performed at [facility], [address] [place], [place] [address]  Culture, blood (routine x 2)     Status: None (Preliminary result)   Collection Time: 03/11/23  9:12 PM   Specimen: BLOOD RIGHT HAND  Result Value Ref Range Status   Specimen Description BLOOD RIGHT HAND  Final   Special Requests   Final    BOTTLES DRAWN AEROBIC AND ANAEROBIC Blood Culture adequate volume   Culture   Final    NO GROWTH < 12 HOURS Performed at [facility], [address] [place], [place] [address]    Report Status PENDING  Incomplete         Radiology Studies: DG Chest 2 View Result Date: 03/11/2023 CLINICAL DATA:  Cough and shortness of breath for the past 3 weeks. EXAM: CHEST - 2 VIEW COMPARISON:  Chest x-ray dated December 05, 2022. FINDINGS: The heart size and mediastinal contours are within normal limits. Normal pulmonary vascularity. New patchy consolidation in both upper lobes in the right lower lobe. No pleural effusion or pneumothorax. No acute osseous abnormality. IMPRESSION: 1. New multifocal pneumonia. Given recurrent infection since October, consider pulmonology consultation. Electronically Signed   By: Obie Dredge M.D.   On: 03/11/2023 10:51        Scheduled Meds:  azithromycin  500 mg Oral Daily   guaiFENesin  600 mg Oral BID   ipratropium  2 spray Each Nare BID   Continuous Infusions:  cefTRIAXone (ROCEPHIN)  IV       LOS: 1 day    Time spent: 35 minutes.    Berton Mount, MD  [facility] Pager #: [phone] 7PM-7AM contact night coverage as above

## 2023-03-12 NOTE — Plan of Care (Signed)

## 2023-03-13 ENCOUNTER — Inpatient Hospital Stay (HOSPITAL_COMMUNITY): Payer: 59

## 2023-03-13 DIAGNOSIS — J189 Pneumonia, unspecified organism: Secondary | ICD-10-CM | POA: Diagnosis not present

## 2023-03-13 LAB — CBC WITH DIFFERENTIAL/PLATELET
Abs Immature Granulocytes: 0 10*3/uL (ref 0.00–0.07)
Basophils Absolute: 0 10*3/uL (ref 0.0–0.1)
Basophils Relative: 0 %
Eosinophils Absolute: 0.5 10*3/uL (ref 0.0–0.5)
Eosinophils Relative: 4 %
HCT: 35.3 % — ABNORMAL LOW (ref 36.0–46.0)
Hemoglobin: 11.9 g/dL — ABNORMAL LOW (ref 12.0–15.0)
Lymphocytes Relative: 16 %
Lymphs Abs: 2.1 10*3/uL (ref 0.7–4.0)
MCH: 26.4 pg (ref 26.0–34.0)
MCHC: 33.7 g/dL (ref 30.0–36.0)
MCV: 78.4 fL — ABNORMAL LOW (ref 80.0–100.0)
Monocytes Absolute: 0.5 10*3/uL (ref 0.1–1.0)
Monocytes Relative: 4 %
Neutro Abs: 9.9 10*3/uL — ABNORMAL HIGH (ref 1.7–7.7)
Neutrophils Relative %: 76 %
Platelets: 326 10*3/uL (ref 150–400)
RBC: 4.5 MIL/uL (ref 3.87–5.11)
RDW: 14.6 % (ref 11.5–15.5)
WBC: 13 10*3/uL — ABNORMAL HIGH (ref 4.0–10.5)
nRBC: 0 % (ref 0.0–0.2)
nRBC: 0 /100{WBCs}

## 2023-03-13 LAB — RENAL FUNCTION PANEL
Albumin: 2.5 g/dL — ABNORMAL LOW (ref 3.5–5.0)
Anion gap: 11 (ref 5–15)
BUN: 10 mg/dL (ref 6–20)
CO2: 20 mmol/L — ABNORMAL LOW (ref 22–32)
Calcium: 8.9 mg/dL (ref 8.9–10.3)
Chloride: 104 mmol/L (ref 98–111)
Creatinine, Ser: 0.5 mg/dL (ref 0.44–1.00)
GFR, Estimated: >60 mL/min (ref >60)
Glucose, Bld: 98 mg/dL (ref 70–99)
Phosphorus: 3.6 mg/dL (ref 2.5–4.6)
Potassium: 3.8 mmol/L (ref 3.5–5.1)
Sodium: 135 mmol/L (ref 135–145)

## 2023-03-13 LAB — LEGIONELLA PNEUMOPHILA SEROGP 1 UR AG: L. pneumophila Serogp 1 Ur Ag: NEGATIVE

## 2023-03-13 LAB — PROCALCITONIN: Procalcitonin: <0.1 ng/mL

## 2023-03-13 LAB — MAGNESIUM: Magnesium: 1.9 mg/dL (ref 1.7–2.4)

## 2023-03-13 NOTE — Plan of Care (Signed)

## 2023-03-13 NOTE — Progress Notes (Signed)
PROGRESS NOTE    Jean Carlson  ZOX:[id number] DOB: 08/06/2000 DOA: 03/11/2023 PCP: Milus Height, PA  Outpatient Specialists:     Brief Narrative:  Patient is a 23 year old female with history of ADHD and multiple sclerosis, on immunomodulators.  Patient was admitted with multifocal pneumonia.  Patient was also found to be [redacted] weeks pregnant.  Unfortunately, patient's current medications may be incompatible with pregnancy.  Will defer to the OB/GYN team and the neurology team.  Patient is quite upset, crying.  Patient is currently on azithromycin and IV Rocephin.  There is history of possible mold exposure.  Results of Fungitell beta D glucan is still pending.  03/13/2023: Seen alongside patient's father, mother and nurse.  No fever or chills.  Patient continues to cough, with sputum production.  Will repeat chest x-ray today.  Will check procalcitonin.   Assessment & Plan: Multifocal pneumonia: -History of MS on immunomodulators. -Patient will be considered immunocompromised. -Complete course of antibiotics. -Supportive care. -Follow results of Fungitell, repeat chest x-ray and procalcitonin.  Pregnancy: -Patient is [redacted] weeks pregnant. -Patient has MS, and was on immunomodulators prior to presentation -Will defer to the neurology team and the OB/GYN team (on discharge).  Multiple sclerosis: -Patient follows up with neurology team. -Medications are on hold   DVT prophylaxis: SCD. Code Status: Full code. Family Communication: Grandmother. Disposition Plan: To depend on hospital course.   Consultants:  None.  Procedures:  None.  Antimicrobials:  IV Rocephin. Azithromycin   Subjective: No new complaints.  Objective: Vitals:   03/13/23 0031 03/13/23 0039 03/13/23 0513 03/13/23 0816  BP: (!) 137/126 (!) 133/94 118/73 (!) 144/86  Pulse: 95 86 89 90  Resp: 17  18   Temp: (!) 97.3 F (36.3 C)  (!) 97.1 F (36.2 C) (!) 97.4 F (36.3 C)  TempSrc:    Oral  SpO2:  96%  96% 97%  Weight:      Height:        Intake/Output Summary (Last 24 hours) at 03/13/2023 1707 Last data filed at 03/13/2023 0800 Gross per 24 hour  Intake 550 ml  Output --  Net 550 ml   Filed Weights   03/11/23 2056  Weight: 99.1 kg    Examination:  General exam: Appears calm and comfortable  Respiratory system: Clear to auscultation.  Cardiovascular system: S1 & S2 heard Gastrointestinal system: Abdomen is soft and nontender.   Central nervous system: Awake and alert.  Patient moves all extremities.   Extremities: No leg edema.  Data Reviewed: I have personally reviewed following labs and imaging studies  CBC: Recent Labs  Lab 03/11/23 2112 03/12/23 0820 03/13/23 1043  WBC 9.2 12.6* 13.0*  NEUTROABS 7.3  --  9.9*  HGB 11.5* 12.6 11.9*  HCT 34.0* 37.8 35.3*  MCV 78.3* 79.9* 78.4*  PLT 344 347 326   Basic Metabolic Panel: Recent Labs  Lab 03/11/23 1436 03/12/23 0820 03/13/23 1043  NA 135 134* 135  K 4.0 3.4* 3.8  CL 105 103 104  CO2 19* 21* 20*  GLUCOSE 74 78 98  BUN 9 7 10   CREATININE 0.55 0.50 0.50  CALCIUM 8.9 8.9 8.9  MG  --  2.1 1.9  PHOS  --  3.1 3.6   GFR: Estimated Creatinine Clearance: 126.3 mL/min (by C-G formula based on SCr of 0.5 mg/dL). Liver Function Tests: Recent Labs  Lab 03/11/23 1436 03/13/23 1043  AST 24  --   ALT 20  --   ALKPHOS 143*  --  BILITOT 0.3  --   PROT 6.3*  --   ALBUMIN 2.6* 2.5*   No results for input(s): "LIPASE", "AMYLASE" in the last 168 hours. No results for input(s): "AMMONIA" in the last 168 hours. Coagulation Profile: No results for input(s): "INR", "PROTIME" in the last 168 hours. Cardiac Enzymes: No results for input(s): "CKTOTAL", "CKMB", "CKMBINDEX", "TROPONINI" in the last 168 hours. BNP (last 3 results) No results for input(s): "PROBNP" in the last 8760 hours. HbA1C: No results for input(s): "HGBA1C" in the last 72 hours. CBG: No results for input(s): "GLUCAP" in the last 168  hours. Lipid Profile: No results for input(s): "CHOL", "HDL", "LDLCALC", "TRIG", "CHOLHDL", "LDLDIRECT" in the last 72 hours. Thyroid Function Tests: No results for input(s): "TSH", "T4TOTAL", "FREET4", "T3FREE", "THYROIDAB" in the last 72 hours. Anemia Panel: No results for input(s): "VITAMINB12", "FOLATE", "FERRITIN", "TIBC", "IRON", "RETICCTPCT" in the last 72 hours. Urine analysis:    Component Value Date/Time   COLORURINE YELLOW 04/09/2019 2054   APPEARANCEUR HAZY (A) 04/09/2019 2054   LABSPEC 1.013 04/09/2019 2054   PHURINE 7.0 04/09/2019 2054   GLUCOSEU NEGATIVE 04/09/2019 2054   HGBUR NEGATIVE 04/09/2019 2054   BILIRUBINUR NEGATIVE 04/09/2019 2054   KETONESUR 5 (A) 04/09/2019 2054   PROTEINUR NEGATIVE 04/09/2019 2054   NITRITE NEGATIVE 04/09/2019 2054   LEUKOCYTESUR LARGE (A) 04/09/2019 2054   Sepsis Labs: @LABRCNTIP (procalcitonin:4,lacticidven:4)  ) Recent Results (from the past 240 hours)  Culture, blood (routine x 2)     Status: None (Preliminary result)   Collection Time: 03/11/23  2:36 PM   Specimen: BLOOD  Result Value Ref Range Status   Specimen Description BLOOD SITE NOT SPECIFIED  Final   Special Requests   Final    BOTTLES DRAWN AEROBIC AND ANAEROBIC Blood Culture results may not be optimal due to an inadequate volume of blood received in culture bottles   Culture   Final    NO GROWTH 2 DAYS Performed at [facility], [address] [place], [place] [address]    Report Status PENDING  Incomplete  Resp panel by RT-PCR (RSV, Flu A&B, Covid) Anterior Nasal Swab     Status: None   Collection Time: 03/11/23  5:35 PM   Specimen: Anterior Nasal Swab  Result Value Ref Range Status   SARS Coronavirus 2 by RT PCR NEGATIVE NEGATIVE Final   Influenza A by PCR NEGATIVE NEGATIVE Final   Influenza B by PCR NEGATIVE NEGATIVE Final    Comment: (NOTE) The Xpert Xpress SARS-CoV-2/FLU/RSV plus assay is intended as an aid in the diagnosis of influenza from  Nasopharyngeal swab specimens and should not be used as a sole basis for treatment. Nasal washings and aspirates are unacceptable for Xpert Xpress SARS-CoV-2/FLU/RSV testing.  Fact Sheet for Patients: BloggerCourse.com  Fact Sheet for Healthcare Providers: SeriousBroker.it  This test is not yet approved or cleared by the [place] FDA and has been authorized for detection and/or diagnosis of SARS-CoV-2 by FDA under an Emergency Use Authorization (EUA). This EUA will remain in effect (meaning this test can be used) for the duration of the COVID-19 declaration under Section 564(b)(1) of the Act, 21 U.S.C. section 360bbb-3(b)(1), unless the authorization is terminated or revoked.     Resp Syncytial Virus by PCR NEGATIVE NEGATIVE Final    Comment: (NOTE) Fact Sheet for Patients: BloggerCourse.com  Fact Sheet for Healthcare Providers: SeriousBroker.it  This test is not yet approved or cleared by the [place] FDA and has been authorized for detection and/or  diagnosis of SARS-CoV-2 by FDA under an Emergency Use Authorization (EUA). This EUA will remain in effect (meaning this test can be used) for the duration of the COVID-19 declaration under Section 564(b)(1) of the Act, 21 U.S.C. section 360bbb-3(b)(1), unless the authorization is terminated or revoked.  Performed at [facility], [address], [place], [place] [address]   Respiratory (~20 pathogens) panel by PCR     Status: None   Collection Time: 03/11/23  5:35 PM   Specimen: Nasopharyngeal Swab; Respiratory  Result Value Ref Range Status   Adenovirus NOT DETECTED NOT DETECTED Final   Coronavirus 229E NOT DETECTED NOT DETECTED Final    Comment: (NOTE) The Coronavirus on the Respiratory Panel, DOES NOT test for the novel  Coronavirus (2019 nCoV)    Coronavirus HKU1 NOT DETECTED NOT DETECTED Final    Coronavirus NL63 NOT DETECTED NOT DETECTED Final   Coronavirus OC43 NOT DETECTED NOT DETECTED Final   Metapneumovirus NOT DETECTED NOT DETECTED Final   Rhinovirus / Enterovirus NOT DETECTED NOT DETECTED Final   Influenza A NOT DETECTED NOT DETECTED Final   Influenza B NOT DETECTED NOT DETECTED Final   Parainfluenza Virus 1 NOT DETECTED NOT DETECTED Final   Parainfluenza Virus 2 NOT DETECTED NOT DETECTED Final   Parainfluenza Virus 3 NOT DETECTED NOT DETECTED Final   Parainfluenza Virus 4 NOT DETECTED NOT DETECTED Final   Respiratory Syncytial Virus NOT DETECTED NOT DETECTED Final   Bordetella pertussis NOT DETECTED NOT DETECTED Final   Bordetella Parapertussis NOT DETECTED NOT DETECTED Final   Chlamydophila pneumoniae NOT DETECTED NOT DETECTED Final   Mycoplasma pneumoniae NOT DETECTED NOT DETECTED Final    Comment: Performed at [facility], [address] [place], [place] [address]  Culture, blood (routine x 2)     Status: None (Preliminary result)   Collection Time: 03/11/23  9:12 PM   Specimen: BLOOD RIGHT HAND  Result Value Ref Range Status   Specimen Description BLOOD RIGHT HAND  Final   Special Requests   Final    BOTTLES DRAWN AEROBIC AND ANAEROBIC Blood Culture adequate volume   Culture   Final    NO GROWTH 2 DAYS Performed at [facility], [address], [place], [place] [address]    Report Status PENDING  Incomplete         Radiology Studies: DG Chest 2 View Result Date: 03/13/2023 CLINICAL DATA:  [id number] Pneumonia [id number] EXAM: CHEST - 2 VIEW COMPARISON:  March 11, 2023 FINDINGS: The cardiomediastinal silhouette is normal in contour. No pleural effusion. No pneumothorax. Persistent RIGHT upper and lower lobe airspace opacities, more confluent in the RIGHT lower lobe in comparison to prior. There is some faint patchy opacities in the LEFT lateral lung. Visualized abdomen is unremarkable. No acute osseous abnormality noted. IMPRESSION: Persistent RIGHT  upper and lower lobe airspace opacities, more confluent in the RIGHT lower lobe in comparison to prior. Findings are concerning for multifocal pneumonia. Electronically Signed   By: Meda Klinefelter M.D.   On: 03/13/2023 16:23        Scheduled Meds:  guaiFENesin  600 mg Oral BID   ipratropium  2 spray Each Nare BID   Continuous Infusions:  cefTRIAXone (ROCEPHIN)  IV 2 g (03/13/23 1406)     LOS: 2 days    Time spent: 35 minutes.    Berton Mount, MD  [facility] Pager #: [phone] 7PM-7AM contact night coverage as above

## 2023-03-14 DIAGNOSIS — J189 Pneumonia, unspecified organism: Secondary | ICD-10-CM | POA: Diagnosis not present

## 2023-03-14 MED ORDER — CEFDINIR 300 MG PO CAPS: 300.0000 mg | ORAL_CAPSULE | Freq: Two times a day (BID) | ORAL | 0 refills | Status: AC

## 2023-03-14 MED ORDER — GUAIFENESIN ER 600 MG PO TB12
600.0000 mg | ORAL_TABLET | Freq: Two times a day (BID) | ORAL | 0 refills | Status: AC
Start: 2023-03-14 — End: 2023-03-19

## 2023-03-14 NOTE — Plan of Care (Signed)
   Problem: Education: Goal: Knowledge of General Education information will improve Description: Including pain rating scale, medication(s)/side effects and non-pharmacologic comfort measures Outcome: Progressing   Problem: Activity: Goal: Risk for activity intolerance will decrease Outcome: Progressing   Problem: Nutrition: Goal: Adequate nutrition will be maintained Outcome: Progressing   Problem: Coping: Goal: Level of anxiety will decrease Outcome: Progressing

## 2023-03-14 NOTE — Discharge Summary (Signed)
Physician Discharge Summary  Patient ID: BRIYA LOOKABAUGH MRN: [id number] DOB/AGE: October 25, 2000 23 y.o.  Admit date: 03/11/2023 Discharge date: 03/14/2023  Admission Diagnoses:  Discharge Diagnoses:  Principal Problem:   Pneumonia   Discharged Condition: stable  Hospital Course:  Patient is a 23 year old female with history of ADHD and multiple sclerosis, on immunomodulators. Patient was admitted with multifocal pneumonia. Patient was also found to be [redacted] weeks pregnant. Unfortunately, patient's current medications may be incompatible with pregnancy. Will defer to the OB/GYN team and the neurology team.  Patient was admitted and managed for pneumonia.  Antibiotic will be transition to oral.  Patient has improved significantly.  Patient will follow-up with primary care provider, OB/GYN and neurology team on discharge.  Multifocal pneumonia: -History of MS on immunomodulators. -Patient was considered immunocompromised. -Patient was admitted and managed with antibiotics.  Patient will complete antibiotics on discharge.   -Follow results of Fungitell, repeat chest x-ray in 4 to 6 weeks.   Pregnancy: -Patient is [redacted] weeks pregnant. -Patient has MS, and was on immunomodulators prior to presentation -Will defer to the neurology team and the OB/GYN team (on discharge).   Multiple sclerosis: -Patient follows up with neurology team. -Medications are on hold   Consults: None  Significant Diagnostic Studies:   Treatments: Antibiotics  Discharge Exam: Blood pressure 106/70, pulse 96, temperature 98.2 F (36.8 C), temperature source Oral, resp. rate 18, height 5\' 5"  (1.651 m), weight 99.1 kg, last menstrual period 09/07/2022, SpO2 96%.   Disposition: Discharge disposition: 01-Home or Self Care       Discharge Instructions     Diet - low sodium heart healthy   Complete by: As directed    Increase activity slowly   Complete by: As directed       Allergies as of 03/14/2023        Reactions   Amoxicillin Other (See Comments)   CAUSED BAD NIGHTMARES Other Reaction(s): bad dreams   Codeine Nausea Only   SEVERE NAUSEA Other Reaction(s): stomach upset   Lisdexamfetamine Other (See Comments)   "Caused depression to the point of self-harm" Other Reaction(s): chest pain        Medication List     STOP taking these medications    acetaminophen 325 MG tablet Commonly known as: TYLENOL   aspirin-sod bicarb-citric acid 325 MG Tbef tablet Commonly known as: ALKA-SELTZER   bisacodyl 5 MG EC tablet Commonly known as: DULCOLAX   bismuth subsalicylate 262 MG/15ML suspension Commonly known as: PEPTO BISMOL   calcium carbonate 500 MG chewable tablet Commonly known as: TUMS - dosed in mg elemental calcium   CHILDRENS MOTRIN PO   clindamycin 300 MG capsule Commonly known as: CLEOCIN   CORICIDIN HBP PO   DAYQUIL PO   etonogestrel-ethinyl estradiol 0.12-0.015 MG/24HR vaginal ring Commonly known as: NUVARING   FLUoxetine 40 MG capsule Commonly known as: PROZAC   fluticasone 50 MCG/ACT nasal spray Commonly known as: FLONASE   ibuprofen 800 MG tablet Commonly known as: ADVIL   methylphenidate 36 MG CR tablet Commonly known as: Concerta   mirabegron ER 50 MG Tb24 tablet Commonly known as: MYRBETRIQ   MUCINEX D PO   Ocrevus 300 MG/10ML injection Generic drug: ocrelizumab   solifenacin 10 MG tablet Commonly known as: VESICARE   TUSSIN SEVERE PO       TAKE these medications    albuterol 108 (90 Base) MCG/ACT inhaler Commonly known as: VENTOLIN HFA Inhale 1-2 puffs into the lungs every 6 (six) hours as needed  for wheezing or shortness of breath.   cefdinir 300 MG capsule Commonly known as: OMNICEF Take 1 capsule (300 mg total) by mouth 2 (two) times daily for 5 days.   cetirizine 10 MG tablet Commonly known as: ZYRTEC Take 10-20 mg by mouth daily as needed for allergies.   cholecalciferol 25 MCG (1000 UNIT) tablet Commonly known as:  VITAMIN D3 Take 1,000 Units by mouth in the morning.   guaiFENesin 600 MG 12 hr tablet Commonly known as: MUCINEX Take 1 tablet (600 mg total) by mouth 2 (two) times daily for 5 days.   ipratropium 0.03 % nasal spray Commonly known as: ATROVENT Place 2 sprays into both nostrils every 12 (twelve) hours.       Time spent: 35 minutes.  SignedBarnetta Chapel 03/14/2023, 3:04 PM

## 2023-03-15 ENCOUNTER — Encounter: Payer: Self-pay | Admitting: Neurology

## 2023-03-15 ENCOUNTER — Telehealth (INDEPENDENT_AMBULATORY_CARE_PROVIDER_SITE_OTHER): Payer: 59 | Admitting: Neurology

## 2023-03-15 DIAGNOSIS — R3915 Urgency of urination: Secondary | ICD-10-CM

## 2023-03-15 DIAGNOSIS — R899 Unspecified abnormal finding in specimens from other organs, systems and tissues: Secondary | ICD-10-CM

## 2023-03-15 DIAGNOSIS — Z79899 Other long term (current) drug therapy: Secondary | ICD-10-CM

## 2023-03-15 DIAGNOSIS — G35 Multiple sclerosis: Secondary | ICD-10-CM

## 2023-03-15 DIAGNOSIS — N319 Neuromuscular dysfunction of bladder, unspecified: Secondary | ICD-10-CM | POA: Diagnosis not present

## 2023-03-15 DIAGNOSIS — R269 Unspecified abnormalities of gait and mobility: Secondary | ICD-10-CM

## 2023-03-15 DIAGNOSIS — Z8701 Personal history of pneumonia (recurrent): Secondary | ICD-10-CM

## 2023-03-15 MED ORDER — MIRABEGRON ER 50 MG PO TB24
50.0000 mg | ORAL_TABLET | Freq: Every day | ORAL | 5 refills | Status: AC
Start: 2023-03-15 — End: ?

## 2023-03-15 MED ORDER — DIMETHYL FUMARATE 240 MG PO CPDR: DELAYED_RELEASE_CAPSULE | ORAL | 11 refills | Status: DC

## 2023-03-15 MED ORDER — SOLIFENACIN SUCCINATE 10 MG PO TABS
10.0000 mg | ORAL_TABLET | Freq: Every day | ORAL | 11 refills | Status: DC
Start: 2023-03-15 — End: 2023-03-29

## 2023-03-15 MED ORDER — DIMETHYL FUMARATE 120 MG PO CPDR: DELAYED_RELEASE_CAPSULE | ORAL | 0 refills | Status: DC

## 2023-03-15 NOTE — Progress Notes (Signed)
[facility]  PATIENT: Jean Carlson DOB: Sep 24, 2000  REFERRING DOCTOR OR PCP:  Milus Height, PA SOURCE: Patient, notes from [facility], imaging and lab reports, MRI images personally reviewed.  _________________________________   HISTORICAL  CHIEF COMPLAINT:  Chief Complaint  Patient presents with   Pneumonia    Recurrent, August 2024 and February 2025   Multiple Sclerosis   Virtual Visit via Video Note I connected with FREDDY SPADAFORA n 03/15/23 at  4:00 PM EST by a video enabled telemedicine application and verified that I am speaking with the correct person.  I discussed the limitations of evaluation and management by telemedicine and the availability of in person appointments. The patient expressed understanding and agreed to proceed.  Patient in home; provider in office    HISTORY OF PRESENT ILLNESS:  Jean Carlson is a 23 y.o.  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  Update  03/11/2023 Hospital notes reviewed.    I had seen her earlier last week due to chronic cough and other symptoms when she showed up for her Ocrevus infusion.  I was very concerned about pneumonia possibility and we opted not to do the infusion.  After evaluation she was sent to the emergency room and was found to have pneumonia and was admitted for treatment.  She was recently discharged.  Also during the hospitalization, she was found to be [redacted] weeks pregnant.     She is planning on terminating the pregnancy.    She is on Ocrevus and tolerates it well.  Her last infusion was 09/10/2022 and the infusion scheduled for February 2025 was canceled due to her pneumonia and this appointment was set up to further discuss possibility of other treatments.  Of note, IgG is normal but IgM is very low at less than 5.  Besides the current pneumonia, in August 2024, she had lingular and LLL opacity c/w pneumonia and was treated.  Compared to last week, she is feeling better but still  feels weaker and more fatigued than typical.  She does not have new neurologic symptoms...    She denies exacerbations since last visit but has had some days when she feels weaker     The last month of each cycle, she has noted more issues wtih balance, tremors and diplopia.  She also has more fatigue.   Last cycle an extra dose of IV Solumedrol a couple weeks before her infusion helped her with the symptoms.    Gait and balance are doing better though not baseline.   She will ise the banister if she needs to hold items with the other hand.    No falls.   Her left arm is slightly weaker, especially in the wrist.   She has some reduced keyboarding due to a mild left tremor.    Fortunately, she is right handed and writing is not affected.   She has mild diplopia.   Vision is doing well.  Diplopia resolved.  She has urinary urgency with occasional incontinence.  She only had partial benefit from [place].  The combination of Vesicare and Myrbetriq has helped her.   She has tried conservative/behavioral measures like fluid restriction, timed voids but these have not significantly helped her urge incontinence..   We prescribed Myrbetriq and she was given 30 days but needs pre-auth for additional refills.     Focus and attention are doing better.   She has some emotional lability and sometimes feels rage.   Prozac had not  helped.   She has rare crying spells .  No inappropriate laughter.  She usually needs 9-10 hours of sleep to feel refreshed.  She has insomnia.   She takes 5000 U daily Vit D.   She is physically active  MS History: In March 2021, she had the onset of slurred speech,left hand tremor and clumsiness, abnormal eye movements on the right and urinary incontinence.   She went to the ED.    She was admitted and received 5 days of IV Solu-Medrol.   While in the hospital she started to note improvement and she has continued to improve.    In retrospect, in February 2021, she had vertigo x 3 weeks and  had several falls, one hyperextending her left knee.   She saw orthopedics.   Also in late 2020, she had a couple weeks of fluctuating numbness in her left hand.  MRI showed enhancing lesions in the pons, medulla and spinal cord (and several other nonenhancing brainstem and spinal cord.   She was started on Ocrevus 05/2019.     MRIs of the brain and cervical spine dated 04/09/2019.  The MRI of the brain shows multiple T2/FLAIR hyperintense foci in the hemispheres, brainstem and cerebellum.  2 foci in the pons and medulla enhance after contrast.  There are also several foci in the hemispheres that enhanced.  There are several foci within the spinal cord.  These are located at C2, more to the right, C3, towards the right and centrally and C4-C5 centrally.  There is subtle enhancement of the C4C5 focus.    MRI of the brain 07/11/2020 showed multiple T2/FLAIR hyperintense foci in the medulla and pons.  Additional foci were noted in the periventricular and deep white matter.  None of the foci enhance.  Compared to the MRI from 04/09/2019, there were no new lesions and the many enhancing lesions on the previous scan no longer enhance and many are smaller in size.  MRI brian and cervical spine 09/17/2021 showed no new lesions.  She has muliple spinal lesions  There is no family history of MS or other autoimmune diseases.   REVIEW OF SYSTEMS: Constitutional: No fevers, chills, sweats, or change in appetite. Eyes: No visual changes, double vision, eye pain Ear, nose and throat: No hearing loss, ear pain, nasal congestion, sore throat Cardiovascular: No chest pain, palpitations Respiratory:  No shortness of breath at rest or with exertion.   No wheezes GastrointestinaI: No nausea, vomiting, diarrhea, abdominal pain, fecal incontinence Genitourinary:  No dysuria, urinary retention or frequency.  No incontinence or nocturia.   Musculoskeletal:  No neck pain, back pain Integumentary: No rash, pruritus, skin  lesions Neurological: as above Psychiatric: As above Endocrine: No palpitations, diaphoresis, change in appetite, change in weigh or increased thirst Hematologic/Lymphatic:  No anemia, purpura, petechiae. Allergic/Immunologic: No itchy/runny eyes, nasal congestion, recent allergic reactions, rashes  ALLERGIES: Allergies  Allergen Reactions   Amoxicillin Other (See Comments)    CAUSED BAD NIGHTMARES  Other Reaction(s): bad dreams   Codeine Nausea Only    SEVERE NAUSEA  Other Reaction(s): stomach upset   Lisdexamfetamine Other (See Comments)    "Caused depression to the point of self-harm"  Other Reaction(s): chest pain    HOME MEDICATIONS:  Current Outpatient Medications:    albuterol (VENTOLIN HFA) 108 (90 Base) MCG/ACT inhaler, Inhale 1-2 puffs into the lungs every 6 (six) hours as needed for wheezing or shortness of breath. (Patient not taking: Reported on 03/11/2023), Disp: 1 each, Rfl:  0   cefdinir (OMNICEF) 300 MG capsule, Take 1 capsule (300 mg total) by mouth 2 (two) times daily for 5 days., Disp: 10 capsule, Rfl: 0   cetirizine (ZYRTEC) 10 MG tablet, Take 10-20 mg by mouth daily as needed for allergies., Disp: , Rfl:    cholecalciferol (VITAMIN D3) 25 MCG (1000 UNIT) tablet, Take 1,000 Units by mouth in the morning., Disp: , Rfl:    guaiFENesin (MUCINEX) 600 MG 12 hr tablet, Take 1 tablet (600 mg total) by mouth 2 (two) times daily for 5 days., Disp: 10 tablet, Rfl: 0   ipratropium (ATROVENT) 0.03 % nasal spray, Place 2 sprays into both nostrils every 12 (twelve) hours., Disp: 30 mL, Rfl: 0  PAST MEDICAL HISTORY: Past Medical History:  Diagnosis Date   ADHD    Multiple sclerosis (HCC)     PAST SURGICAL HISTORY: Past Surgical History:  Procedure Laterality Date   WISDOM TOOTH EXTRACTION     x4    FAMILY HISTORY: Family History  Problem Relation Age of Onset   Hyperlipidemia Maternal Grandmother    Hypertension Maternal Grandmother    Diabetes Paternal  Grandfather    Hypertension Paternal Grandfather    Heart failure Paternal Grandfather     SOCIAL HISTORY:  Social History   Socioeconomic History   Marital status: Single    Spouse name: Not on file   Number of children: Not on file   Years of education: Not on file   Highest education level: Not on file  Occupational History    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  Tobacco Use   Smoking status: Never   Smokeless tobacco: Never  Vaping Use   Vaping status: Every Day   Substances: Nicotine, Flavoring  Substance and Sexual Activity   Alcohol use: Yes   Drug use: Never   Sexual activity: Yes  Other Topics Concern   Not on file  Social History Narrative   Right handed   Caffeine use: tea and soda daily   Social Drivers of Health   Financial Resource Strain: Not on file  Food Insecurity: No Food Insecurity (03/11/2023)   Hunger Vital Sign    Worried About Running Out of Food in the Last Year: Never true    Ran Out of Food in the Last Year: Never true  Transportation Needs: No Transportation Needs (03/11/2023)   PRAPARE - Administrator, Civil Service (Medical): No    Lack of Transportation (Non-Medical): No  Physical Activity: Not on file  Stress: Not on file  Social Connections: Unknown (06/10/2021)   [facility] from [facility]   Social Network    Social Network: Not on file  Intimate Partner Violence: Not At Risk (03/11/2023)   Humiliation, Afraid, Rape, and Kick questionnaire    Fear of Current or Ex-Partner: No    Emotionally Abused: No    Physically Abused: No    Sexually Abused: No     PHYSICAL EXAM from 03/11/2023  There were no vitals filed for this visit.    There is no height or weight on file to calculate BMI.  No results found.  General: The patient is well-developed and well-nourished and in no acute distress.  Heart had a regular rate and rhythm and she had mild tachycardia.  Respiratory rate was 24.  Reduced lung  sounds on the left  HEENT:  Head is [place]/AT.    Skin: Extremities are without rash or  edema.   Neurologic Exam  Mental  status: The patient is alert and oriented x 3 at the time of the examination. The patient has apparent normal recent and remote memory, with an apparently normal attention span and concentration ability.   Speech is normal.  Cranial nerves: Extraocular movements are full though she noted slight diplopia on far right gaze.[name]  No nystagmus.  There is good facial sensation to soft touch bilaterally.Facial strength is normal.  Trapezius and sternocleidomastoid strength is normal. No dysarthria is noted.  No obvious hearing deficits are noted.  Motor:  Muscle bulk is normal.  Very slight tremor on the left tone is normal. Strength is  5 / 5 in all 4 extremities.   Sensory: Sensory testing is intact to soft touch and vibration sensation in arms and mild reduced vibration in left leg.  Coordination: Finger-nose-finger is slightly reduced in the left arm versus the right arm.  Nutrition was normal  Gait and station: Station is normal.   Gait is mildly wide.  Tandem gait is wide.  Romberg is negative.   Reflexes: Deep tendon reflexes show spread at the knees and increased at the ankles with nonsustained clonus.        DIAGNOSTIC DATA (LABS, IMAGING, TESTING) - I reviewed patient records, labs, notes, testing and imaging myself where available.  Lab Results  Component Value Date   WBC 13.0 (H) 03/13/2023   HGB 11.9 (L) 03/13/2023   HCT 35.3 (L) 03/13/2023   MCV 78.4 (L) 03/13/2023   PLT 326 03/13/2023      Component Value Date/Time   NA 135 03/13/2023 1043   K 3.8 03/13/2023 1043   CL 104 03/13/2023 1043   CO2 20 (L) 03/13/2023 1043   GLUCOSE 98 03/13/2023 1043   BUN 10 03/13/2023 1043   CREATININE 0.50 03/13/2023 1043   CALCIUM 8.9 03/13/2023 1043   PROT 6.3 (L) 03/11/2023 1436   ALBUMIN 2.5 (L) 03/13/2023 1043   AST 24 03/11/2023 1436   ALT 20 03/11/2023 1436    ALKPHOS 143 (H) 03/11/2023 1436   BILITOT 0.3 03/11/2023 1436   GFRNONAA >60 03/13/2023 1043   GFRAA >60 04/24/2019 8119    Lab Results  Component Value Date   TSH 2.107 04/09/2019       ASSESSMENT AND PLAN  Multiple sclerosis (HCC)  High risk medication use  Abnormal laboratory test  Neurogenic bladder  Urinary urgency  Gait disturbance  History of recent pneumonia   1.  Because she has had 2 pneumonias in less than a year, and she has low IgM, I feel we need to stop the Ocrevus and have her start a different disease modifying therapy.  Although she is pregnant, she is planning on terminating the pregnancy later this week.  We will initiate dimethyl fumarate and titrate up to 240 mg twice daily.  If she is unable to tolerate this, consider [place].   2.    Continue taking vitamin D.  Try to eat well and exercise as tolerated. 3.   Continue Vesicare for bladder issues.  Add Myrbetriq. 4.    We will see her back in 3-4 months or sooner if there are new or worsening neurologic symptoms.   Follow Up Instructions: I discussed the assessment and treatment plan with the patient. The patient was provided an opportunity to ask questions and all were answered. The patient agreed with the plan and demonstrated an understanding of the instructions.    The patient was advised to call back or seek an in-person evaluation  if the symptoms worsen or if the condition fails to improve as anticipated.  I provided 28 minutes of non-face-to-face time during this encounter.  Hula Tasso A. Epimenio Foot, MD, [facility] 03/15/2023, 5:55 PM Certified in Neurology, Clinical Neurophysiology, Sleep Medicine and Neuroimaging  [facility] [address] [place], [place] [address] [phone]

## 2023-03-15 NOTE — Telephone Encounter (Signed)
Called mother, scheduled work in [place] today at 4pm with Dr. Epimenio Foot. (MD approved).

## 2023-03-15 NOTE — Telephone Encounter (Signed)
Called and spoke w/ Junious Dresser. States daughter very upset and requested we speak with her instead. They plan to go Thursday to [place] for abortion. This is what she has decided. With this new info, wondering if she can go back to Ocrevus after this is completed.  Still on omnicef/mucinex. Started Omnicef yesterday (taking for 5 days total). Hospital recommended she have repeat chest xray in 14 days. Does not to do glatiramer injections.  No f/u scheduled yet with PCP for post hospital f/u. Recommended she call today to set this up.    Aware I will send to MD for review and call back.

## 2023-03-16 LAB — CULTURE, BLOOD (ROUTINE X 2)
Culture: NO GROWTH
Culture: NO GROWTH
Special Requests: ADEQUATE

## 2023-03-18 LAB — FUNGITELL BETA-D-GLUCAN
Fungitell Value:: <31.25 pg/mL
Result Name:: NEGATIVE

## 2023-03-22 ENCOUNTER — Encounter: Payer: Self-pay | Admitting: Neurology

## 2023-03-22 ENCOUNTER — Telehealth: Payer: Self-pay | Admitting: Neurology

## 2023-03-22 NOTE — Telephone Encounter (Signed)
 [facility] asking for PA to be done for both the Tecfidera prescriptions they said they have sent the requests through cover my meds.

## 2023-03-22 NOTE — Telephone Encounter (Signed)
 PA for dimethyl fumarate 240mg  (Key: [id number]) and dimethyl fumarate 120mg  (Key: [id number]) have been completed and are waiting for CMM to attach notes.

## 2023-03-23 ENCOUNTER — Telehealth: Payer: Self-pay

## 2023-03-23 NOTE — Telephone Encounter (Signed)
*  [facility]  Pharmacy Patient Advocate Encounter   Received notification from Pt Calls Messages that prior authorization for Mirabegron ER 50MG  er tablets  is required/requested.   Insurance verification completed.   The patient is insured through [facility] .   Per test claim: PA required; PA submitted to above mentioned insurance via CoverMyMeds Key/confirmation #/EOC [id number] Status is pending

## 2023-03-23 NOTE — Telephone Encounter (Signed)
 Thank you very much! I will be submitting request under new encounter.

## 2023-03-23 NOTE — Telephone Encounter (Signed)
 Both dimethyl fumarate PAs have been submitted to [facility] via CMM. Should have a determination within 3-5 business days.

## 2023-03-25 NOTE — Telephone Encounter (Signed)
 Pharmacy Patient Advocate Encounter  Received notification from [facility] that Prior Authorization for Mirabegron ER 50MG  er tablets  has been DENIED.  Full denial letter will be uploaded to the media tab. See denial reason below.   PA #/Case ID/Reference #: [id number]

## 2023-03-26 NOTE — Telephone Encounter (Signed)
 Given complex Hx I would feel more comfortable with Dr. Epimenio Foot reviewing her alternative options for bladder function. I am not familiar with the medication suggested. Please advise patient to wait till Dr. Epimenio Foot can weigh in next week.

## 2023-03-29 ENCOUNTER — Encounter: Payer: Self-pay | Admitting: Neurology

## 2023-03-29 ENCOUNTER — Other Ambulatory Visit: Payer: Self-pay | Admitting: Neurology

## 2023-03-29 MED ORDER — FESOTERODINE FUMARATE ER 8 MG PO TB24: 8.0000 mg | ORAL_TABLET | Freq: Every day | ORAL | 5 refills | Status: DC

## 2023-03-29 NOTE — Telephone Encounter (Signed)
 [name]

## 2023-04-07 ENCOUNTER — Other Ambulatory Visit (HOSPITAL_COMMUNITY): Payer: Self-pay

## 2023-04-07 ENCOUNTER — Telehealth: Payer: Self-pay | Admitting: Pharmacy Technician

## 2023-04-07 NOTE — Telephone Encounter (Signed)
 Pharmacy Patient Advocate Encounter   Received notification from CoverMyMeds that prior authorization for Fesoterodine Fumarate ER 8MG  er tablets is required/requested.   Insurance verification completed.   The patient is insured through [facility] .   Per test claim: PA required; PA submitted to above mentioned insurance via CoverMyMeds Key/confirmation #/EOC [id number] Status is pending

## 2023-04-08 NOTE — Telephone Encounter (Signed)
 I called patient to discuss how her dimethyl fumarate is going. No answer, left a message asking her to call us back.

## 2023-04-12 DIAGNOSIS — Z0289 Encounter for other administrative examinations: Secondary | ICD-10-CM

## 2023-04-15 ENCOUNTER — Other Ambulatory Visit (HOSPITAL_COMMUNITY): Payer: Self-pay

## 2023-04-15 NOTE — Telephone Encounter (Signed)
 Pharmacy Patient Advocate Encounter  Received notification from [facility] that Prior Authorization for Fesoterodine Fumarate ER 8MG  er tablets has been APPROVED from 04/12/2023 to 04/11/2024. Unable to obtain price due to refill too soon rejection, last fill date 04/13/2023 next available fill date5/29/2025   PA #/Case ID/Reference #: PA Case ID #: [id number]

## 2023-04-20 NOTE — Telephone Encounter (Signed)
 I called patient again to discuss if she has started the dimethyl fumarate and how it is going for her.  No answer, left a voicemail asking her to call us back.

## 2023-04-26 ENCOUNTER — Telehealth: Payer: Self-pay | Admitting: *Deleted

## 2023-04-26 NOTE — Telephone Encounter (Signed)
 Gave completed/signed [employer] form re: FMLA back to medical records to process for pt.

## 2023-04-27 NOTE — Telephone Encounter (Signed)
 Left vm asking for patient to give [place] a call back regarding forms

## 2023-04-28 NOTE — Telephone Encounter (Signed)
 I called patient. She reports that she is doing well on the DF. She sometimes forgets to take the second dose but otherwise is doing well. I reminded her of the upcoming appointment in May with Dr. Epimenio Foot. Pt verbalized understanding.

## 2023-04-29 ENCOUNTER — Telehealth: Payer: Self-pay | Admitting: *Deleted

## 2023-04-29 NOTE — Telephone Encounter (Signed)
 I faxed pt [employer] form on 04/29/2023

## 2023-04-29 NOTE — Telephone Encounter (Signed)
 Called pt. She confirmed she went back to work full time/no restrictions 03/23/23. Form updated and sent back to medical records to process for pt.

## 2023-06-01 ENCOUNTER — Encounter: Payer: Self-pay | Admitting: Neurology

## 2023-06-01 ENCOUNTER — Ambulatory Visit (INDEPENDENT_AMBULATORY_CARE_PROVIDER_SITE_OTHER): Admitting: Neurology

## 2023-06-01 VITALS — BP 118/78 | HR 85 | Ht 66.0 in | Wt 217.0 lb

## 2023-06-01 DIAGNOSIS — N319 Neuromuscular dysfunction of bladder, unspecified: Secondary | ICD-10-CM

## 2023-06-01 DIAGNOSIS — F909 Attention-deficit hyperactivity disorder, unspecified type: Secondary | ICD-10-CM

## 2023-06-01 DIAGNOSIS — R269 Unspecified abnormalities of gait and mobility: Secondary | ICD-10-CM | POA: Diagnosis not present

## 2023-06-01 DIAGNOSIS — G35 Multiple sclerosis: Secondary | ICD-10-CM | POA: Diagnosis not present

## 2023-06-01 DIAGNOSIS — Z79899 Other long term (current) drug therapy: Secondary | ICD-10-CM | POA: Diagnosis not present

## 2023-06-01 NOTE — Progress Notes (Signed)
 [facility]  PATIENT: Jean Carlson DOB: 03-15-2000  REFERRING DOCTOR OR PCP:  Diamond Formica, PA SOURCE: Patient, notes from [facility], imaging and lab reports, MRI images personally reviewed.  _________________________________   HISTORICAL  CHIEF COMPLAINT:  Chief Complaint  Patient presents with   Follow-up    Pt in 11 alone Pt here MS f/u  Pt states balance is off     HISTORY OF PRESENT ILLNESS:  Jean Carlson is a 23 y.o.  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  Update  06/01/2023 After 2 pneumonias, we d/c Ocrevus  and started DMF.  She is tolerating.    She denies exacerbations since last visit but has had some days when she feels weaker     The last month of each cycle, she has noted more issues wtih balance, tremors and diplopia.  She also has more fatigue.   Last cycle an extra dose of IV Solumedrol a couple weeks before her infusion helped her with the symptoms.    Gait and balance are doing better though not baseline.   She will ise the banister if she needs to hold items with the other hand.    No falls.   Her left arm is slightly weaker, especially in the wrist.   She has some reduced keyboarding due to a mild left tremor.    Fortunately, she is right handed and writing is not affected.   She has mild diplopia.   Vision is doing well.  Diplopia resolved.  Bladder function improved on combination of Toviaz  plus Myrbetriq  but Myrbetriq  is too expensive (even though generic now) and partially covered  She has tried conservative/behavioral measures like fluid restriction, timed voids but these have not significantly helped her urge incontinence..      Focus and attention are doing better.   She has some emotional lability and sometimes feels rage.   Prozac  had not helped.   She has rare crying spells .  No inappropriate laughter.  She usually needs 9-10 hours of sleep to feel refreshed.  She has insomnia.   She takes 5000 U daily Vit D.   She is physically  active  MS History: In March 2021, she had the onset of slurred speech,left hand tremor and clumsiness, abnormal eye movements on the right and urinary incontinence.   She went to the ED.    She was admitted and received 5 days of IV Solu-Medrol .   While in the hospital she started to note improvement and she has continued to improve.    In retrospect, in February 2021, she had vertigo x 3 weeks and had several falls, one hyperextending her left knee.   She saw orthopedics.   Also in late 2020, she had a couple weeks of fluctuating numbness in her left hand.  MRI showed enhancing lesions in the pons, medulla and spinal cord (and several other nonenhancing brainstem and spinal cord.   She was started on Ocrevus  05/2019.     MRIs of the brain and cervical spine dated 04/09/2019.  The MRI of the brain shows multiple T2/FLAIR hyperintense foci in the hemispheres, brainstem and cerebellum.  2 foci in the pons and medulla enhance after contrast.  There are also several foci in the hemispheres that enhanced.  There are several foci within the spinal cord.  These are located at C2, more to the right, C3, towards the right and centrally and C4-C5 centrally.  There is subtle enhancement of the C4C5 focus.  MRI of the brain 07/11/2020 showed multiple T2/FLAIR hyperintense foci in the medulla and pons.  Additional foci were noted in the periventricular and deep white matter.  None of the foci enhance.  Compared to the MRI from 04/09/2019, there were no new lesions and the many enhancing lesions on the previous scan no longer enhance and many are smaller in size.  MRI brian and cervical spine 09/17/2021 showed no new lesions.  She has muliple spinal lesions  There is no family history of MS or other autoimmune diseases.  REVIEW OF SYSTEMS: Constitutional: No fevers, chills, sweats, or change in appetite. Eyes: No visual changes, double vision, eye pain Ear, nose and throat: No hearing loss, ear pain, nasal  congestion, sore throat Cardiovascular: No chest pain, palpitations Respiratory:  No shortness of breath at rest or with exertion.   No wheezes GastrointestinaI: No nausea, vomiting, diarrhea, abdominal pain, fecal incontinence Genitourinary:  No dysuria, urinary retention or frequency.  No incontinence or nocturia.   Musculoskeletal:  No neck pain, back pain Integumentary: No rash, pruritus, skin lesions Neurological: as above Psychiatric: As above Endocrine: No palpitations, diaphoresis, change in appetite, change in weigh or increased thirst Hematologic/Lymphatic:  No anemia, purpura, petechiae. Allergic/Immunologic: No itchy/runny eyes, nasal congestion, recent allergic reactions, rashes  ALLERGIES: Allergies  Allergen Reactions   Amoxicillin Other (See Comments)    CAUSED BAD NIGHTMARES  Other Reaction(s): bad dreams   Codeine Nausea Only    SEVERE NAUSEA  Other Reaction(s): stomach upset   Lisdexamfetamine Other (See Comments)    "Caused depression to the point of self-harm"  Other Reaction(s): chest pain    HOME MEDICATIONS:  Current Outpatient Medications:    cetirizine (ZYRTEC) 10 MG tablet, Take 10-20 mg by mouth daily as needed for allergies., Disp: , Rfl:    cholecalciferol (VITAMIN D3) 25 MCG (1000 UNIT) tablet, Take 1,000 Units by mouth in the morning., Disp: , Rfl:    Dimethyl Fumarate  (TECFIDERA ) 240 MG CPDR, 1 p.o. twice daily after meals, Disp: 60 capsule, Rfl: 11   fesoterodine  (TOVIAZ ) 8 MG TB24 tablet, Take 1 tablet (8 mg total) by mouth daily., Disp: 30 tablet, Rfl: 5   mirabegron  ER (MYRBETRIQ ) 50 MG TB24 tablet, Take 1 tablet (50 mg total) by mouth daily., Disp: 30 tablet, Rfl: 5  PAST MEDICAL HISTORY: Past Medical History:  Diagnosis Date   ADHD    Multiple sclerosis (HCC)     PAST SURGICAL HISTORY: Past Surgical History:  Procedure Laterality Date   WISDOM TOOTH EXTRACTION     x4    FAMILY HISTORY: Family History  Problem Relation Age  of Onset   Hyperlipidemia Maternal Grandmother    Hypertension Maternal Grandmother    Diabetes Paternal Grandfather    Hypertension Paternal Grandfather    Heart failure Paternal Grandfather    Multiple sclerosis Neg Hx     SOCIAL HISTORY:  Social History   Socioeconomic History   Marital status: Single    Spouse name: Not on file   Number of children: Not on file   Years of education: Not on file   Highest education level: Not on file  Occupational History    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  Tobacco Use   Smoking status: Every Day    Current packs/day: 0.25    Types: Cigarettes   Smokeless tobacco: Never  Vaping Use   Vaping status: Not on file  Substance and Sexual Activity   Alcohol use: Yes   Drug use: Never  Sexual activity: Yes  Other Topics Concern   Not on file  Social History Narrative   Right handed   Caffeine use: tea and soda daily   Pt lives with family    Pt works    Social Drivers of Corporate investment banker Strain: Not on file  Food Insecurity: No Food Insecurity (03/11/2023)   Hunger Vital Sign    Worried About Running Out of Food in the Last Year: Never true    Ran Out of Food in the Last Year: Never true  Transportation Needs: No Transportation Needs (03/11/2023)   PRAPARE - Administrator, Civil Service (Medical): No    Lack of Transportation (Non-Medical): No  Physical Activity: Not on file  Stress: Not on file  Social Connections: Unknown (06/10/2021)   Received from [facility]   Social Network    Social Network: Not on file  Intimate Partner Violence: Not At Risk (03/11/2023)   Humiliation, Afraid, Rape, and Kick questionnaire    Fear of Current or Ex-Partner: No    Emotionally Abused: No    Physically Abused: No    Sexually Abused: No     PHYSICAL EXAM  Vitals:   06/01/23 1123  BP: 118/78  Pulse: 85  Weight: 217 lb (98.4 kg)  Height: 5\' 6"  (1.676 m)     Body mass index is 35.02  kg/m.  No results found.  General: The patient is well-developed and well-nourished and in no acute distress.  Heart had a regular rate and rhythm and she had mild tachycardia.  Respiratory rate was 24.  Reduced lung sounds on the left  HEENT:  Head is [place]/AT.    Skin: Extremities are without rash or  edema.   Neurologic Exam  Mental status: The patient is alert and oriented x 3 at the time of the examination. The patient has apparent normal recent and remote memory, with an apparently normal attention span and concentration ability.   Speech is normal.  Cranial nerves: Extraocular movements are full though she noted slight diplopia on far right gaze.Aaron Aas  No nystagmus.  There is good facial sensation to soft touch bilaterally.Facial strength is normal.  Trapezius and sternocleidomastoid strength is normal. No dysarthria is noted.  No obvious hearing deficits are noted.  Motor:  Muscle bulk is normal.  Very slight tremor on the left tone is normal. Strength is  5 / 5 in all 4 extremities.   Sensory: Sensory testing is intact to soft touch and vibration sensation in arms and mild reduced vibration in left leg.  Coordination: Finger-nose-finger is slightly reduced in the left arm versus the right arm.  Nutrition was normal  Gait and station: Station is normal.   Gait is mildly wide.  Tandem gait is wide.  Romberg is negative.   Reflexes: Deep tendon reflexes show spread at the knees and increased at the ankles with nonsustained clonus.        DIAGNOSTIC DATA (LABS, IMAGING, TESTING) - I reviewed patient records, labs, notes, testing and imaging myself where available.  Lab Results  Component Value Date   WBC 13.0 (H) 03/13/2023   HGB 11.9 (L) 03/13/2023   HCT 35.3 (L) 03/13/2023   MCV 78.4 (L) 03/13/2023   PLT 326 03/13/2023      Component Value Date/Time   NA 135 03/13/2023 1043   K 3.8 03/13/2023 1043   CL 104 03/13/2023 1043   CO2 20 (L) 03/13/2023 1043   GLUCOSE  98  03/13/2023 1043   BUN 10 03/13/2023 1043   CREATININE 0.50 03/13/2023 1043   CALCIUM  8.9 03/13/2023 1043   PROT 6.3 (L) 03/11/2023 1436   ALBUMIN 2.5 (L) 03/13/2023 1043   AST 24 03/11/2023 1436   ALT 20 03/11/2023 1436   ALKPHOS 143 (H) 03/11/2023 1436   BILITOT 0.3 03/11/2023 1436   GFRNONAA >60 03/13/2023 1043   GFRAA >60 04/24/2019 9147    Lab Results  Component Value Date   TSH 2.107 04/09/2019       ASSESSMENT AND PLAN  Multiple sclerosis (HCC) - Plan: CBC with Differential/Platelet, Hepatic function panel  High risk medication use - Plan: CBC with Differential/Platelet, Hepatic function panel  Neurogenic bladder  Gait disturbance  Attention deficit hyperactivity disorder (ADHD), unspecified ADHD type   1.    Her MS appears stable.  Continue DMF.  We will check some blood work.  Sometime the next year we will check another MRI of the brain to determine if there is breakthrough activity.  If present we will need to consider different disease modifying therapy. 2.    Continue taking vitamin D.  Try to eat well and exercise as tolerated. 3.   Continue fesoterodine  for bladder issues.  She is unable to afford Myrbetriq   4.    We will see her back in 6 months or sooner if there are new or worsening neurologic symptoms.      Rindy Kollman A. Godwin Lat, MD, [facility] 06/01/2023, 11:49 AM Certified in Neurology, Clinical Neurophysiology, Sleep Medicine and Neuroimaging  [facility] [address] [place], [place] [address] [phone]

## 2023-06-02 ENCOUNTER — Other Ambulatory Visit: Payer: Self-pay | Admitting: Neurology

## 2023-06-02 ENCOUNTER — Encounter: Payer: Self-pay | Admitting: Neurology

## 2023-06-02 ENCOUNTER — Telehealth: Payer: Self-pay | Admitting: *Deleted

## 2023-06-02 DIAGNOSIS — G35 Multiple sclerosis: Secondary | ICD-10-CM

## 2023-06-02 DIAGNOSIS — Z79899 Other long term (current) drug therapy: Secondary | ICD-10-CM

## 2023-06-02 LAB — HEPATIC FUNCTION PANEL
ALT: 15 IU/L (ref 0–32)
AST: 22 IU/L (ref 0–40)
Albumin: 4.3 g/dL (ref 4.0–5.0)
Alkaline Phosphatase: 66 IU/L (ref 44–121)
Bilirubin Total: 0.3 mg/dL (ref 0.0–1.2)
Bilirubin, Direct: 0.12 mg/dL (ref 0.00–0.40)
Total Protein: 6.1 g/dL (ref 6.0–8.5)

## 2023-06-02 LAB — CBC WITH DIFFERENTIAL/PLATELET
Basophils Absolute: 0 10*3/uL (ref 0.0–0.2)
Basos: 1 %
EOS (ABSOLUTE): 0.1 10*3/uL (ref 0.0–0.4)
Eos: 5 %
Hematocrit: 40.9 % (ref 34.0–46.6)
Hemoglobin: 13.5 g/dL (ref 11.1–15.9)
Immature Grans (Abs): 0 10*3/uL (ref 0.0–0.1)
Immature Granulocytes: 0 %
Lymphocytes Absolute: 1.3 10*3/uL (ref 0.7–3.1)
Lymphs: 45 %
MCH: 26.8 pg (ref 26.6–33.0)
MCHC: 33 g/dL (ref 31.5–35.7)
MCV: 81 fL (ref 79–97)
Monocytes Absolute: 0.5 10*3/uL (ref 0.1–0.9)
Monocytes: 16 %
Neutrophils Absolute: 1 10*3/uL — ABNORMAL LOW (ref 1.4–7.0)
Neutrophils: 33 %
Platelets: 197 10*3/uL (ref 150–450)
RBC: 5.03 x10E6/uL (ref 3.77–5.28)
RDW: 13.4 % (ref 11.7–15.4)
WBC: 2.9 10*3/uL — ABNORMAL LOW (ref 3.4–10.8)

## 2023-06-02 NOTE — Telephone Encounter (Signed)
   Left message for patient to call or log onto my chart to read the message.

## 2023-06-03 NOTE — Telephone Encounter (Signed)
 LVM for pt to call.

## 2023-09-16 ENCOUNTER — Encounter: Payer: Self-pay | Admitting: Neurology

## 2023-09-16 ENCOUNTER — Other Ambulatory Visit (HOSPITAL_COMMUNITY): Payer: Self-pay

## 2023-09-16 ENCOUNTER — Other Ambulatory Visit: Payer: Self-pay | Admitting: *Deleted

## 2023-09-16 ENCOUNTER — Telehealth: Payer: Self-pay | Admitting: *Deleted

## 2023-09-16 DIAGNOSIS — G35 Multiple sclerosis: Secondary | ICD-10-CM

## 2023-09-16 MED ORDER — DIMETHYL FUMARATE 240 MG PO CPDR: DELAYED_RELEASE_CAPSULE | ORAL | 2 refills | Status: DC

## 2023-09-16 NOTE — Telephone Encounter (Signed)
 Pharmacy Patient Advocate Encounter   Received notification from Physician's Office that prior authorization for Dimethyl Fumerate 240mg   is required/requested.   Insurance verification completed.   The patient is insured through [facility] .   Per test claim: PA required; PA submitted to above mentioned insurance via Latent Key/confirmation #/EOC [id number] Status is pending  Submitted as an urgent request

## 2023-09-16 NOTE — Telephone Encounter (Signed)
 EPIC states PA needed for dimethyl fumarate . Pt almost out of med. Can you complete urgently? Thank you

## 2023-09-17 ENCOUNTER — Other Ambulatory Visit (HOSPITAL_COMMUNITY): Payer: Self-pay

## 2023-09-17 MED ORDER — DIMETHYL FUMARATE 240 MG PO CPDR: DELAYED_RELEASE_CAPSULE | ORAL | 2 refills | Status: DC

## 2023-09-17 NOTE — Telephone Encounter (Signed)
 Spoke with [facility] in [place]. Was told they do not handle that medication there but [facility]. I have sent the prescription for Dimethyl Fumarate  over to [facility] in [place]. Also faxed the insurance card to them. Sent pt mychart message with update too.

## 2023-09-17 NOTE — Addendum Note (Signed)
 Addended by: HILLIARD HEATHER CROME on: 09/17/2023 10:58 AM   Modules accepted: Orders

## 2023-09-17 NOTE — Telephone Encounter (Signed)
 Pharmacy Patient Advocate Encounter  Received notification from [employer] that Prior Authorization for Dimethyl Fumarate  has been APPROVED from 09/16/2023 to 01/25/2038   PA #/Case ID/Reference #: [id number]

## 2023-09-20 ENCOUNTER — Other Ambulatory Visit: Payer: Self-pay | Admitting: *Deleted

## 2023-09-20 DIAGNOSIS — G35 Multiple sclerosis: Secondary | ICD-10-CM

## 2023-09-20 MED ORDER — DIMETHYL FUMARATE 240 MG PO CPDR: 1.0000 | DELAYED_RELEASE_CAPSULE | Freq: Two times a day (BID) | ORAL | 2 refills | Status: DC

## 2023-09-20 NOTE — Telephone Encounter (Signed)
 System states rx to [facility] had error and not sent electronically. I called pharmacy at [phone]. Spoke w/ Montie. Confirmed they did not receive rx.   Correct pharmacy:  [facility]  [place], [place] - [address] Phone: [phone]  Fax: [phone]     I resent rx to this location instead.

## 2023-09-20 NOTE — Addendum Note (Signed)
 Addended by: JOSHUA MAURILIO CROME on: 09/20/2023 10:13 AM   Modules accepted: Orders

## 2023-09-21 ENCOUNTER — Other Ambulatory Visit

## 2023-09-21 ENCOUNTER — Other Ambulatory Visit: Payer: Self-pay | Admitting: *Deleted

## 2023-09-21 DIAGNOSIS — G35 Multiple sclerosis: Secondary | ICD-10-CM

## 2023-09-21 MED ORDER — DIMETHYL FUMARATE 240 MG PO CPDR: 1.0000 | DELAYED_RELEASE_CAPSULE | Freq: Two times a day (BID) | ORAL | 2 refills | Status: DC

## 2023-09-22 ENCOUNTER — Other Ambulatory Visit

## 2023-09-28 NOTE — Telephone Encounter (Signed)
 Called [facility] at [phone]. Spoke w/ automated system who states they need clarification on rx. Asked to speak with rep. Spoke w/ Richie. Confirmed med TBD directly to pt. Last time pt called, rx not on file per his records.   Order ready to go and waiting on 5.00 copay from pt. I did conference call with pt to try and have her speak with them while I had them on the phone. Went to pt VM. I LVM providing update. I also sent mychart back.

## 2023-10-18 ENCOUNTER — Other Ambulatory Visit (HOSPITAL_COMMUNITY): Payer: Self-pay

## 2023-10-27 DIAGNOSIS — Z3009 Encounter for other general counseling and advice on contraception: Secondary | ICD-10-CM | POA: Diagnosis not present

## 2023-10-27 DIAGNOSIS — Z113 Encounter for screening for infections with a predominantly sexual mode of transmission: Secondary | ICD-10-CM | POA: Diagnosis not present

## 2023-10-27 DIAGNOSIS — Z30015 Encounter for initial prescription of vaginal ring hormonal contraceptive: Secondary | ICD-10-CM | POA: Diagnosis not present

## 2023-10-27 DIAGNOSIS — Z01419 Encounter for gynecological examination (general) (routine) without abnormal findings: Secondary | ICD-10-CM | POA: Diagnosis not present

## 2023-10-27 DIAGNOSIS — N760 Acute vaginitis: Secondary | ICD-10-CM | POA: Diagnosis not present

## 2023-10-27 DIAGNOSIS — Z3202 Encounter for pregnancy test, result negative: Secondary | ICD-10-CM | POA: Diagnosis not present

## 2023-11-16 ENCOUNTER — Telehealth: Payer: Self-pay | Admitting: Neurology

## 2023-11-16 DIAGNOSIS — G35D Multiple sclerosis, unspecified: Secondary | ICD-10-CM

## 2023-11-16 MED ORDER — DIMETHYL FUMARATE 240 MG PO CPDR: 1.0000 | DELAYED_RELEASE_CAPSULE | Freq: Two times a day (BID) | ORAL | 0 refills | Status: AC

## 2023-11-16 NOTE — Telephone Encounter (Signed)
 Redell from [employer] has called stating as a result of changes in pt's insurance her pharmacy needs to go from [place] to [facility] [phone] fax#[phone] this is for pt's Dimethyl Fumarate  (TECFIDERA ) 240 MG CPDR

## 2023-11-16 NOTE — Telephone Encounter (Signed)
 Dr. Vear- Appears she did not come back in for repeat labs as discussed. Ok to refill 1 month until she is seen 12/07/23 and do labs then?

## 2023-12-07 ENCOUNTER — Encounter: Payer: Self-pay | Admitting: Neurology

## 2023-12-07 ENCOUNTER — Ambulatory Visit: Admitting: Neurology

## 2023-12-07 VITALS — BP 118/76 | HR 104 | Ht 65.0 in | Wt 225.0 lb

## 2023-12-07 DIAGNOSIS — F909 Attention-deficit hyperactivity disorder, unspecified type: Secondary | ICD-10-CM

## 2023-12-07 DIAGNOSIS — Z79899 Other long term (current) drug therapy: Secondary | ICD-10-CM | POA: Diagnosis not present

## 2023-12-07 DIAGNOSIS — G35A Relapsing-remitting multiple sclerosis: Secondary | ICD-10-CM

## 2023-12-07 DIAGNOSIS — R2689 Other abnormalities of gait and mobility: Secondary | ICD-10-CM

## 2023-12-07 DIAGNOSIS — N319 Neuromuscular dysfunction of bladder, unspecified: Secondary | ICD-10-CM

## 2023-12-07 DIAGNOSIS — R269 Unspecified abnormalities of gait and mobility: Secondary | ICD-10-CM

## 2023-12-07 DIAGNOSIS — R3915 Urgency of urination: Secondary | ICD-10-CM

## 2023-12-07 NOTE — Progress Notes (Signed)
 [facility]  PATIENT: Jean Carlson DOB: 2000/02/14  REFERRING DOCTOR OR PCP:  Sydelle Close, PA SOURCE: Patient, notes from [facility], imaging and lab reports, MRI images personally reviewed.  _________________________________   HISTORICAL  CHIEF COMPLAINT:  Chief Complaint  Patient presents with   Multiple Sclerosis    Rm10, alone, Ms: pt reported balance concerns, memory concerns regarding the ability to take mer medication    HISTORY OF PRESENT ILLNESS:  Jean Carlson is a 23 y.o.  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  Update  12/07/2023 Due to having 2 separate pneumonias, we d/c Ocrevus  and started DMF.  She is tolerating it well and is taking twice a day.   She denies exacerbations since last visit or major new neurologic symptom.   She is currently not working but is in process of trying to line up another   She is hoping to have kids in the next couple years and we discussed I would likely switch to Copaxone for pregnancy and breast feeding and then switch back or change.  Gait and balance are back to baseline.  Gait is mildly off balanced but no falls.   She needs to use the banister if she needs to hold items with the other hand.  Legs are strong.  Her left arm is slightly weaker/clumsy, especially in the wrist.   She has some reduced keyboarding due to a mild left tremor.    Fortunately, she is right handed and writing is not affected.     Vision is doing well.  Diplopia resolved.  Bladder function improved on combination of Toviaz  plus Myrbetriq .   Myrbetriq  has not easily been covered by insurance.   She has tried conservative/behavioral measures like fluid restriction, timed voids but these have not significantly helped her urge incontinence..      Focus and attention are doing better.   She has some emotional lability but this is better than earlier in the year.   Prozac  had not helped.   She usually needs 9-10 hours of sleep to feel refreshed but often  gets less. .  She has insomnia some nights.   She takes 5000 U daily Vit D.   She is physically active  MS History: In March 2021, she had the onset of slurred speech,left hand tremor and clumsiness, abnormal eye movements on the right and urinary incontinence.   She went to the ED.    She was admitted and received 5 days of IV Solu-Medrol .   While in the hospital she started to note improvement and she has continued to improve.    In retrospect, in February 2021, she had vertigo x 3 weeks and had several falls, one hyperextending her left knee.   She saw orthopedics.   Also in late 2020, she had a couple weeks of fluctuating numbness in her left hand.  MRI showed enhancing lesions in the pons, medulla and spinal cord (and several other nonenhancing brainstem and spinal cord.   She was started on Ocrevus  05/2019.     MRIs of the brain and cervical spine dated 04/09/2019.  The MRI of the brain shows multiple T2/FLAIR hyperintense foci in the hemispheres, brainstem and cerebellum.  2 foci in the pons and medulla enhance after contrast.  There are also several foci in the hemispheres that enhanced.  There are several foci within the spinal cord.  These are located at C2, more to the right, C3, towards the right and centrally and C4-C5 centrally.  There is subtle enhancement of the C4C5 focus.    MRI of the brain 07/11/2020 showed multiple T2/FLAIR hyperintense foci in the medulla and pons.  Additional foci were noted in the periventricular and deep white matter.  None of the foci enhance.  Compared to the MRI from 04/09/2019, there were no new lesions and the many enhancing lesions on the previous scan no longer enhance and many are smaller in size.  MRI brian and cervical spine 09/17/2021 showed no new lesions.  She has muliple spinal lesions  There is no family history of MS or other autoimmune diseases.  REVIEW OF SYSTEMS: Constitutional: No fevers, chills, sweats, or change in appetite. Eyes: No visual  changes, double vision, eye pain Ear, nose and throat: No hearing loss, ear pain, nasal congestion, sore throat Cardiovascular: No chest pain, palpitations Respiratory:  No shortness of breath at rest or with exertion.   No wheezes GastrointestinaI: No nausea, vomiting, diarrhea, abdominal pain, fecal incontinence Genitourinary:  No dysuria, urinary retention or frequency.  No incontinence or nocturia.   Musculoskeletal:  No neck pain, back pain Integumentary: No rash, pruritus, skin lesions Neurological: as above Psychiatric: As above Endocrine: No palpitations, diaphoresis, change in appetite, change in weigh or increased thirst Hematologic/Lymphatic:  No anemia, purpura, petechiae. Allergic/Immunologic: No itchy/runny eyes, nasal congestion, recent allergic reactions, rashes  ALLERGIES: Allergies  Allergen Reactions   Amoxicillin Other (See Comments)    CAUSED BAD NIGHTMARES  Other Reaction(s): bad dreams   Codeine Nausea Only    SEVERE NAUSEA  Other Reaction(s): stomach upset   Lisdexamfetamine Other (See Comments)    Caused depression to the point of self-harm  Other Reaction(s): chest pain    HOME MEDICATIONS:  Current Outpatient Medications:    cetirizine (ZYRTEC) 10 MG tablet, Take 10-20 mg by mouth daily as needed for allergies., Disp: , Rfl:    cholecalciferol (VITAMIN D3) 25 MCG (1000 UNIT) tablet, Take 1,000 Units by mouth in the morning., Disp: , Rfl:    Dimethyl Fumarate  (TECFIDERA ) 240 MG CPDR, Take 1 capsule (240 mg total) by mouth 2 (two) times daily after a meal., Disp: 60 capsule, Rfl: 0   fesoterodine  (TOVIAZ ) 8 MG TB24 tablet, Take 1 tablet (8 mg total) by mouth daily., Disp: 30 tablet, Rfl: 5   mirabegron  ER (MYRBETRIQ ) 50 MG TB24 tablet, Take 1 tablet (50 mg total) by mouth daily., Disp: 30 tablet, Rfl: 5  PAST MEDICAL HISTORY: Past Medical History:  Diagnosis Date   ADHD    Multiple sclerosis     PAST SURGICAL HISTORY: Past Surgical History:   Procedure Laterality Date   WISDOM TOOTH EXTRACTION     x4    FAMILY HISTORY: Family History  Problem Relation Age of Onset   Hyperlipidemia Maternal Grandmother    Hypertension Maternal Grandmother    Diabetes Paternal Grandfather    Hypertension Paternal Grandfather    Heart failure Paternal Grandfather    Multiple sclerosis Neg Hx     SOCIAL HISTORY:  Social History   Socioeconomic History   Marital status: Single    Spouse name: Not on file   Number of children: Not on file   Years of education: Not on file   Highest education level: Not on file  Occupational History    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  Tobacco Use   Smoking status: Every Day    Current packs/day: 0.25    Types: Cigarettes   Smokeless tobacco: Never  Vaping Use   Vaping status:  Not on file  Substance and Sexual Activity   Alcohol use: Yes   Drug use: Never   Sexual activity: Yes  Other Topics Concern   Not on file  Social History Narrative   Right handed   Caffeine use: tea and soda daily   Pt lives with family    Pt works    Social Drivers of Corporate Investment Banker Strain: Not on file  Food Insecurity: No Food Insecurity (03/11/2023)   Hunger Vital Sign    Worried About Running Out of Food in the Last Year: Never true    Ran Out of Food in the Last Year: Never true  Transportation Needs: No Transportation Needs (03/11/2023)   PRAPARE - Administrator, Civil Service (Medical): No    Lack of Transportation (Non-Medical): No  Physical Activity: Not on file  Stress: Not on file  Social Connections: Unknown (06/10/2021)   Received from [facility]   Social Network    Social Network: Not on file  Intimate Partner Violence: Not At Risk (03/11/2023)   Humiliation, Afraid, Rape, and Kick questionnaire    Fear of Current or Ex-Partner: No    Emotionally Abused: No    Physically Abused: No    Sexually Abused: No     PHYSICAL EXAM  Vitals:   12/07/23 1042  BP:  118/76  Pulse: (!) 104  SpO2: 99%  Weight: 225 lb (102.1 kg)  Height: 5' 5 (1.651 m)     Body mass index is 37.44 kg/m.  No results found.  General: The patient is well-developed and well-nourished and in no acute distress.  Heart had a regular rate and rhythm and she had mild tachycardia.  Respiratory rate was 24.  Reduced lung sounds on the left  HEENT:  Head is [place]/AT.    Skin: Extremities are without rash or  edema.   Neurologic Exam  Mental status: The patient is alert and oriented x 3 at the time of the examination. The patient has apparent normal recent and remote memory, with an apparently normal attention span and concentration ability.   Speech is normal.  Cranial nerves: Extraocular movements are full though she noted slight diplopia on far right gaze.SABRA  No nystagmus.  There is good facial sensation to soft touch bilaterally.Facial strength is normal.  Trapezius and sternocleidomastoid strength is normal. No dysarthria is noted.  No obvious hearing deficits are noted.  Motor:  Muscle bulk is normal.  Very slight tremor on the left tone is normal. Strength is  5 / 5 in all 4 extremities.   Sensory: Sensory testing is intact to soft touch and vibration sensation in arms and mild reduced vibration in left leg.  Coordination: Finger-nose-finger is slightly reduced in the left arm versus the right arm.  Nutrition was normal  Gait and station: Station is normal.   Gait is mildly wide.   Tandem gait is wide.  Romberg is negative.  Reflexes: Deep tendon reflexes show spread at the knees and increased at the ankles with nonsustained clonus.        DIAGNOSTIC DATA (LABS, IMAGING, TESTING) - I reviewed patient records, labs, notes, testing and imaging myself where available.  Lab Results  Component Value Date   WBC 2.9 (L) 06/01/2023   HGB 13.5 06/01/2023   HCT 40.9 06/01/2023   MCV 81 06/01/2023   PLT 197 06/01/2023      Component Value Date/Time   NA 135  03/13/2023 1043  K 3.8 03/13/2023 1043   CL 104 03/13/2023 1043   CO2 20 (L) 03/13/2023 1043   GLUCOSE 98 03/13/2023 1043   BUN 10 03/13/2023 1043   CREATININE 0.50 03/13/2023 1043   CALCIUM  8.9 03/13/2023 1043   PROT 6.1 06/01/2023 1201   ALBUMIN 4.3 06/01/2023 1201   AST 22 06/01/2023 1201   ALT 15 06/01/2023 1201   ALKPHOS 66 06/01/2023 1201   BILITOT 0.3 06/01/2023 1201   GFRNONAA >60 03/13/2023 1043   GFRAA >60 04/24/2019 9392    Lab Results  Component Value Date   TSH 2.107 04/09/2019       ASSESSMENT AND PLAN  Relapsing remitting multiple sclerosis - Plan: MR BRAIN W WO CONTRAST, Comprehensive metabolic panel with GFR  High risk medication use - Plan: CBC with Differential/Platelet, Comprehensive metabolic panel with GFR, CANCELED: CBC with Differential/Platelet  Gait disturbance  Attention deficit hyperactivity disorder (ADHD), unspecified ADHD type  Urinary urgency  Imbalance - Plan: MR BRAIN W WO CONTRAST  Neurogenic bladder   1.    Her MS appears stable on DMF.  We will check CBC/Diff.   Sometime the next year we will check another MRI of the brain to determine if there is breakthrough activity.  If present we will need to consider different disease modifying therapy. 2.    Continue taking vitamin D.  Try to eat well and exercise as tolerated. 3.   Continue fesoterodine  and Myrbetriq  for bladder issues.    4.    We will see her back in 6 months or sooner if there are new or worsening neurologic symptoms.      Jeramia Saleeby A. Vear, MD, [facility] 12/07/2023, 5:50 PM Certified in Neurology, Clinical Neurophysiology, Sleep Medicine and Neuroimaging  [facility] [address] [place], [place] [address] [phone]

## 2023-12-08 ENCOUNTER — Ambulatory Visit: Payer: Self-pay | Admitting: Neurology

## 2023-12-08 LAB — CBC WITH DIFFERENTIAL/PLATELET
Basophils Absolute: 0 x10E3/uL (ref 0.0–0.2)
Basos: 0 %
EOS (ABSOLUTE): 0.1 x10E3/uL (ref 0.0–0.4)
Eos: 3 %
Hematocrit: 46.9 % — ABNORMAL HIGH (ref 34.0–46.6)
Hemoglobin: 15.5 g/dL (ref 11.1–15.9)
Immature Grans (Abs): 0 x10E3/uL (ref 0.0–0.1)
Immature Granulocytes: 0 %
Lymphocytes Absolute: 1.5 x10E3/uL (ref 0.7–3.1)
Lymphs: 32 %
MCH: 28.8 pg (ref 26.6–33.0)
MCHC: 33 g/dL (ref 31.5–35.7)
MCV: 87 fL (ref 79–97)
Monocytes Absolute: 0.5 x10E3/uL (ref 0.1–0.9)
Monocytes: 11 %
Neutrophils Absolute: 2.6 x10E3/uL (ref 1.4–7.0)
Neutrophils: 54 %
Platelets: 242 x10E3/uL (ref 150–450)
RBC: 5.38 x10E6/uL — ABNORMAL HIGH (ref 3.77–5.28)
RDW: 12.9 % (ref 11.7–15.4)
WBC: 4.8 x10E3/uL (ref 3.4–10.8)

## 2023-12-08 LAB — COMPREHENSIVE METABOLIC PANEL WITH GFR
ALT: 11 IU/L (ref 0–32)
AST: 15 IU/L (ref 0–40)
Albumin: 4.4 g/dL (ref 4.0–5.0)
Alkaline Phosphatase: 63 IU/L (ref 41–116)
BUN/Creatinine Ratio: 10 (ref 9–23)
BUN: 8 mg/dL (ref 6–20)
Bilirubin Total: 0.5 mg/dL (ref 0.0–1.2)
CO2: 22 mmol/L (ref 20–29)
Calcium: 9.1 mg/dL (ref 8.7–10.2)
Chloride: 104 mmol/L (ref 96–106)
Creatinine, Ser: 0.78 mg/dL (ref 0.57–1.00)
Globulin, Total: 2.1 g/dL (ref 1.5–4.5)
Glucose: 81 mg/dL (ref 70–99)
Potassium: 4.3 mmol/L (ref 3.5–5.2)
Sodium: 140 mmol/L (ref 134–144)
Total Protein: 6.5 g/dL (ref 6.0–8.5)
eGFR: 108 mL/min/1.73 (ref >59)

## 2024-01-11 ENCOUNTER — Telehealth: Payer: Self-pay | Admitting: Neurology

## 2024-01-11 NOTE — Telephone Encounter (Signed)
 [facility]: [id number] exp. 02/25/24  [facility] was scheduled at [facility] but they are out of network and so is [facility]. I called the patient and let her know and she was ok with me cancelling her MRI appt at [facility] and I asked [facility] to call her to schedule her there.

## 2024-01-14 ENCOUNTER — Other Ambulatory Visit

## 2024-01-14 ENCOUNTER — Ambulatory Visit (HOSPITAL_COMMUNITY)
Admission: RE | Admit: 2024-01-14 | Discharge: 2024-01-14 | Disposition: A | Discharge status: Home / Self Care | Source: Ambulatory Visit | Attending: Neurology | Admitting: Neurology

## 2024-01-14 DIAGNOSIS — R2689 Other abnormalities of gait and mobility: Secondary | ICD-10-CM | POA: Diagnosis present

## 2024-01-14 DIAGNOSIS — G35A Relapsing-remitting multiple sclerosis: Secondary | ICD-10-CM | POA: Diagnosis not present

## 2024-01-14 MED ORDER — GADOBUTROL 1 MMOL/ML IV SOLN
10.0000 mL | Freq: Once | INTRAVENOUS | Status: AC | PRN
  Administered 2024-01-14: 10 mL via INTRAVENOUS

## 2024-01-15 ENCOUNTER — Other Ambulatory Visit: Payer: Self-pay | Admitting: Neurology

## 2024-02-01 ENCOUNTER — Encounter: Payer: Self-pay | Admitting: Neurology

## 2024-03-07 ENCOUNTER — Ambulatory Visit: Payer: Self-pay | Admitting: Family Medicine

## 2024-04-26 ENCOUNTER — Ambulatory Visit: Admitting: Nurse Practitioner

## 2024-07-05 ENCOUNTER — Ambulatory Visit: Admitting: Neurology
